# Patient Record
Sex: Male | Born: 1970 | Race: Black or African American | Hispanic: No | Smoking: Former smoker
Health system: Southern US, Community
[De-identification: ages and names within clinical notes are randomized; demographics above are authoritative.]

## PROBLEM LIST (undated history)

## (undated) DIAGNOSIS — M109 Gout, unspecified: Secondary | ICD-10-CM

## (undated) DIAGNOSIS — D649 Anemia, unspecified: Secondary | ICD-10-CM

## (undated) DIAGNOSIS — T7840XA Allergy, unspecified, initial encounter: Secondary | ICD-10-CM

## (undated) DIAGNOSIS — R011 Cardiac murmur, unspecified: Secondary | ICD-10-CM

## (undated) DIAGNOSIS — J301 Allergic rhinitis due to pollen: Secondary | ICD-10-CM

## (undated) DIAGNOSIS — F32A Depression, unspecified: Secondary | ICD-10-CM

## (undated) DIAGNOSIS — I10 Essential (primary) hypertension: Secondary | ICD-10-CM

## (undated) DIAGNOSIS — E7800 Pure hypercholesterolemia, unspecified: Secondary | ICD-10-CM

## (undated) DIAGNOSIS — H409 Unspecified glaucoma: Secondary | ICD-10-CM

## (undated) DIAGNOSIS — F419 Anxiety disorder, unspecified: Secondary | ICD-10-CM

## (undated) DIAGNOSIS — F489 Nonpsychotic mental disorder, unspecified: Secondary | ICD-10-CM

## (undated) DIAGNOSIS — H269 Unspecified cataract: Secondary | ICD-10-CM

## (undated) DIAGNOSIS — M1990 Unspecified osteoarthritis, unspecified site: Secondary | ICD-10-CM

## (undated) DIAGNOSIS — G709 Myoneural disorder, unspecified: Secondary | ICD-10-CM

## (undated) DIAGNOSIS — I509 Heart failure, unspecified: Secondary | ICD-10-CM

## (undated) DIAGNOSIS — IMO0002 Reserved for concepts with insufficient information to code with codable children: Secondary | ICD-10-CM

## (undated) DIAGNOSIS — E119 Type 2 diabetes mellitus without complications: Secondary | ICD-10-CM

## (undated) HISTORY — DX: Gout, unspecified: M10.9

## (undated) HISTORY — DX: Allergic rhinitis due to pollen: J30.1

## (undated) HISTORY — DX: Heart failure, unspecified: I50.9

## (undated) HISTORY — DX: Pure hypercholesterolemia, unspecified: E78.00

## (undated) HISTORY — DX: Type 2 diabetes mellitus without complications: E11.9

## (undated) HISTORY — DX: Nonpsychotic mental disorder, unspecified: F48.9

## (undated) HISTORY — PX: WISDOM TOOTH EXTRACTION: SHX21

## (undated) HISTORY — PX: HEMORROIDECTOMY: SUR656

## (undated) HISTORY — PX: EYE SURGERY: SHX253

---

## 1998-02-06 ENCOUNTER — Emergency Department (HOSPITAL_COMMUNITY): Admission: EM | Admit: 1998-02-06 | Discharge: 1998-02-06 | Payer: Self-pay

## 1998-02-12 ENCOUNTER — Emergency Department (HOSPITAL_COMMUNITY): Admission: EM | Admit: 1998-02-12 | Discharge: 1998-02-12 | Payer: Self-pay | Admitting: Emergency Medicine

## 1998-03-05 ENCOUNTER — Encounter: Payer: Self-pay | Admitting: Emergency Medicine

## 1998-03-05 ENCOUNTER — Emergency Department (HOSPITAL_COMMUNITY): Admission: EM | Admit: 1998-03-05 | Discharge: 1998-03-05 | Payer: Self-pay | Admitting: Emergency Medicine

## 2001-06-18 ENCOUNTER — Emergency Department (HOSPITAL_COMMUNITY): Admission: EM | Admit: 2001-06-18 | Discharge: 2001-06-18 | Payer: Self-pay | Admitting: Emergency Medicine

## 2001-07-04 ENCOUNTER — Encounter: Payer: Self-pay | Admitting: Specialist

## 2001-07-04 ENCOUNTER — Encounter: Admission: RE | Admit: 2001-07-04 | Discharge: 2001-07-04 | Payer: Self-pay | Admitting: Specialist

## 2019-12-26 ENCOUNTER — Encounter (HOSPITAL_COMMUNITY): Payer: Self-pay

## 2019-12-26 ENCOUNTER — Observation Stay (HOSPITAL_COMMUNITY)
Admission: EM | Admit: 2019-12-26 | Discharge: 2019-12-28 | Disposition: A | Discharge status: Home / Self Care | Payer: 59 | Attending: Family Medicine | Admitting: Family Medicine

## 2019-12-26 DIAGNOSIS — I509 Heart failure, unspecified: Secondary | ICD-10-CM

## 2019-12-26 DIAGNOSIS — R55 Syncope and collapse: Secondary | ICD-10-CM | POA: Insufficient documentation

## 2019-12-26 DIAGNOSIS — Z20822 Contact with and (suspected) exposure to covid-19: Secondary | ICD-10-CM | POA: Insufficient documentation

## 2019-12-26 DIAGNOSIS — E119 Type 2 diabetes mellitus without complications: Secondary | ICD-10-CM | POA: Insufficient documentation

## 2019-12-26 HISTORY — DX: Essential (primary) hypertension: I10

## 2019-12-26 HISTORY — DX: Unspecified osteoarthritis, unspecified site: M19.90

## 2019-12-26 HISTORY — DX: Syncope and collapse: R55

## 2019-12-26 LAB — URINALYSIS, ROUTINE W REFLEX MICROSCOPIC
Bacteria, UA: NONE SEEN
Bilirubin Urine: NEGATIVE
Glucose, UA: >=500 mg/dL — AB
Hgb urine dipstick: NEGATIVE
Ketones, ur: NEGATIVE mg/dL
Leukocytes,Ua: NEGATIVE
Nitrite: NEGATIVE
Protein, ur: NEGATIVE mg/dL
Specific Gravity, Urine: 1.014 (ref 1.005–1.030)
pH: 5 (ref 5.0–8.0)

## 2019-12-26 LAB — BASIC METABOLIC PANEL
Anion gap: 9 (ref 5–15)
BUN: 14 mg/dL (ref 6–20)
CO2: 25 mmol/L (ref 22–32)
Calcium: 10.1 mg/dL (ref 8.9–10.3)
Chloride: 106 mmol/L (ref 98–111)
Creatinine, Ser: 0.95 mg/dL (ref 0.61–1.24)
GFR calc Af Amer: >60 mL/min (ref >60)
GFR calc non Af Amer: >60 mL/min (ref >60)
Glucose, Bld: 117 mg/dL — ABNORMAL HIGH (ref 70–99)
Potassium: 4.1 mmol/L (ref 3.5–5.1)
Sodium: 140 mmol/L (ref 135–145)

## 2019-12-26 LAB — CBC
HCT: 47.9 % (ref 39.0–52.0)
Hemoglobin: 15.2 g/dL (ref 13.0–17.0)
MCH: 27 pg (ref 26.0–34.0)
MCHC: 31.7 g/dL (ref 30.0–36.0)
MCV: 84.9 fL (ref 80.0–100.0)
Platelets: 255 10*3/uL (ref 150–400)
RBC: 5.64 MIL/uL (ref 4.22–5.81)
RDW: 13 % (ref 11.5–15.5)
WBC: 7.7 10*3/uL (ref 4.0–10.5)
nRBC: 0 % (ref 0.0–0.2)

## 2019-12-26 NOTE — ED Triage Notes (Signed)
Pt from work with ems for bear syncopal episode while lifting heavy bags, pt became diaphoretic and hot. recent hx of diabetes. Pt has no complaints. Pt a.o VSS

## 2019-12-27 ENCOUNTER — Emergency Department (HOSPITAL_COMMUNITY): Payer: 59

## 2019-12-27 ENCOUNTER — Observation Stay (HOSPITAL_BASED_OUTPATIENT_CLINIC_OR_DEPARTMENT_OTHER): Payer: 59

## 2019-12-27 ENCOUNTER — Encounter (HOSPITAL_COMMUNITY): Payer: Self-pay | Admitting: Family Medicine

## 2019-12-27 ENCOUNTER — Other Ambulatory Visit: Payer: Self-pay

## 2019-12-27 DIAGNOSIS — I509 Heart failure, unspecified: Secondary | ICD-10-CM | POA: Diagnosis not present

## 2019-12-27 DIAGNOSIS — R55 Syncope and collapse: Secondary | ICD-10-CM

## 2019-12-27 HISTORY — DX: Syncope and collapse: R55

## 2019-12-27 LAB — CBC
HCT: 46.5 % (ref 39.0–52.0)
Hemoglobin: 15.1 g/dL (ref 13.0–17.0)
MCH: 27.2 pg (ref 26.0–34.0)
MCHC: 32.5 g/dL (ref 30.0–36.0)
MCV: 83.8 fL (ref 80.0–100.0)
Platelets: 237 10*3/uL (ref 150–400)
RBC: 5.55 MIL/uL (ref 4.22–5.81)
RDW: 13.2 % (ref 11.5–15.5)
WBC: 5.6 10*3/uL (ref 4.0–10.5)
nRBC: 0 % (ref 0.0–0.2)

## 2019-12-27 LAB — BASIC METABOLIC PANEL
Anion gap: 12 (ref 5–15)
BUN: 14 mg/dL (ref 6–20)
CO2: 23 mmol/L (ref 22–32)
Calcium: 9.8 mg/dL (ref 8.9–10.3)
Chloride: 103 mmol/L (ref 98–111)
Creatinine, Ser: 0.87 mg/dL (ref 0.61–1.24)
GFR calc Af Amer: >60 mL/min (ref >60)
GFR calc non Af Amer: >60 mL/min (ref >60)
Glucose, Bld: 152 mg/dL — ABNORMAL HIGH (ref 70–99)
Potassium: 3.7 mmol/L (ref 3.5–5.1)
Sodium: 138 mmol/L (ref 135–145)

## 2019-12-27 LAB — ECHOCARDIOGRAM COMPLETE
Area-P 1/2: 3.03 cm2
Height: 71 in
S' Lateral: 2.26 cm
Weight: 3760 oz

## 2019-12-27 LAB — LIPID PANEL
Cholesterol: 168 mg/dL (ref 0–200)
HDL: 32 mg/dL — ABNORMAL LOW (ref >40)
LDL Cholesterol: 99 mg/dL (ref 0–99)
Total CHOL/HDL Ratio: 5.3 RATIO
Triglycerides: 185 mg/dL — ABNORMAL HIGH (ref <150)
VLDL: 37 mg/dL (ref 0–40)

## 2019-12-27 LAB — HEMOGLOBIN A1C
Hgb A1c MFr Bld: 10.1 % — ABNORMAL HIGH (ref 4.8–5.6)
Mean Plasma Glucose: 243.17 mg/dL

## 2019-12-27 LAB — TROPONIN I (HIGH SENSITIVITY)
Troponin I (High Sensitivity): 3 ng/L (ref <18)
Troponin I (High Sensitivity): 3 ng/L (ref <18)

## 2019-12-27 LAB — GLUCOSE, CAPILLARY
Glucose-Capillary: 163 mg/dL — ABNORMAL HIGH (ref 70–99)
Glucose-Capillary: 124 mg/dL — ABNORMAL HIGH (ref 70–99)
Glucose-Capillary: 116 mg/dL — ABNORMAL HIGH (ref 70–99)

## 2019-12-27 LAB — MAGNESIUM: Magnesium: 1.8 mg/dL (ref 1.7–2.4)

## 2019-12-27 LAB — TSH: TSH: 1.969 u[IU]/mL (ref 0.350–4.500)

## 2019-12-27 LAB — HIV ANTIBODY (ROUTINE TESTING W REFLEX): HIV Screen 4th Generation wRfx: NONREACTIVE

## 2019-12-27 LAB — BRAIN NATRIURETIC PEPTIDE: B Natriuretic Peptide: 173.8 pg/mL — ABNORMAL HIGH (ref 0.0–100.0)

## 2019-12-27 LAB — SARS CORONAVIRUS 2 BY RT PCR (HOSPITAL ORDER, PERFORMED IN ~~LOC~~ HOSPITAL LAB): SARS Coronavirus 2: NEGATIVE

## 2019-12-27 LAB — CBG MONITORING, ED: Glucose-Capillary: 122 mg/dL — ABNORMAL HIGH (ref 70–99)

## 2019-12-27 MED ORDER — SODIUM CHLORIDE 0.9% FLUSH
3.0000 mL | Freq: Two times a day (BID) | INTRAVENOUS | Status: DC
  Administered 2019-12-27: 3 mL via INTRAVENOUS

## 2019-12-27 MED ORDER — POLYETHYLENE GLYCOL 3350 17 G PO PACK: 17.0000 g | PACK | Freq: Every day | ORAL | Status: DC | PRN

## 2019-12-27 MED ORDER — ACETAMINOPHEN 325 MG PO TABS: 650.0000 mg | ORAL_TABLET | Freq: Four times a day (QID) | ORAL | Status: DC | PRN

## 2019-12-27 MED ORDER — INSULIN ASPART 100 UNIT/ML ~~LOC~~ SOLN
0.0000 [IU] | Freq: Three times a day (TID) | SUBCUTANEOUS | Status: DC
  Administered 2019-12-27: 2 [IU] via SUBCUTANEOUS

## 2019-12-27 MED ORDER — ACETAMINOPHEN 650 MG RE SUPP: 650.0000 mg | Freq: Four times a day (QID) | RECTAL | Status: DC | PRN

## 2019-12-27 MED ORDER — ENOXAPARIN SODIUM 40 MG/0.4ML ~~LOC~~ SOLN
40.0000 mg | Freq: Every day | SUBCUTANEOUS | Status: DC
  Administered 2019-12-27: 40 mg via SUBCUTANEOUS
  Filled 2019-12-27: qty 0.4

## 2019-12-27 MED ORDER — ROSUVASTATIN CALCIUM 5 MG PO TABS
10.0000 mg | ORAL_TABLET | Freq: Every day | ORAL | Status: DC
  Administered 2019-12-27 – 2019-12-28 (×2): 10 mg via ORAL
  Filled 2019-12-27 (×3): qty 2

## 2019-12-27 MED ORDER — FUROSEMIDE 10 MG/ML IJ SOLN
40.0000 mg | Freq: Once | INTRAMUSCULAR | Status: AC
  Administered 2019-12-27: 40 mg via INTRAVENOUS
  Filled 2019-12-27: qty 4

## 2019-12-27 NOTE — Progress Notes (Signed)
  Echocardiogram 2D Echocardiogram has been performed.  Victor [name] 12/27/2019, 1:54 PM

## 2019-12-27 NOTE — H&P (Addendum)
Family Medicine Teaching [facility] Admission History and Physical Service Pager: [phone]  Patient name: Victor [name] Medical record number: [id number] Date of birth: 1970/08/28 Age: 49 y.o. Gender: male  Primary Care Provider: System, Pcp Not In Consultants: None Code Status: Full Preferred Emergency Contact: Audry Pili, [phone]  Chief Complaint: dizziness and palpitations     Assessment and Plan: Victor [name] is a 49 y.o. male presenting after near syncopal episode at work. PMH is significant for congestive heart disease, diabetes.    Near Syncope Patient presented after having near syncopal episode with associated palpitations, dizziness, diaphoresis and chest discomfort after lifting heavy equipment at work. States that this has occurred 5-6 times in the last couple months.  A few of which, he had LOC as he was not able to sit down soon enough.  Events have occurred with exertion, during a shower and when he was hot.  Possibly vasovagal as patient reports several of these events have occurred when he was hot.  Doubt infectious causes patient is afebrile without leukocytosis. Possibly TIA however patient denies confusion, extremity weakness or headache during this event. There is growing concern for an abnormal rhythm as patient complained of palpitations during this event with known hx of CHF. Denied nausea or vomiting.  Patient reports pitching/charley horse type pain in his chest that last for several minutes during some of these events. Has persistent LE edema. Patient reports hx of PFO.  Etiology likely cardiac in origin. Chest x-ray, BMP and CBC in ED were unremarkable. UA with glucosuria. Vital signs stable.  Will obtain ECHO to further evaluate. Patient may need cardiology evaluation.  - Admit to cardiac telemetry, attending Dr. Manson Passey - AM EKG - Obtain ECHO - Lasix 40 mg IV, titrate depending on fluid status and creatinine - Vitals per unit routine - Strict I's and  O's  - Daily weights - Continuous pulse ox - Follow up a1c, BNP, lipid panel, TSH, Mg  - Repeat BMP, CBC today  - AM CMP, BMP - Orthostatic vitals  Type 2 Diabetes mellitus Patient recently diagnosed with DM 5-6 weeks ago after having blood sugar at work that read  "High".  Patient hospitalized for about a day in [facility]. Started on Metformin and Trulicility.   Reports taking medications as prescribed.  Blood sugars at home have been between 130-150.  Glucose on admission 117 with glucosuria.  Home regimen includes 4.5 mg Trulicity weekly and 1000 mg daily of Metformin.  States that his last A1c was "off the charts".  - sensitive sliding scale insulin - Start Crestor 10 mg  - Hold home medication - a1c -Lipid panel  Congestive Heart Failure  No rales or JVP however patient has 2+/3+ pitting edema.  Reports his leg swelling is better than his baseline today.  His shoe size increased from 11 to 14 due to swelling. Patient reporting worsening shortness of breath,  4 pilllow orthopnea and  occassional chest discomfort described as "pinch of the heart or a charlie horse". Discomfort lasts for 5-10 minutes. Feels short of breath when walking.  Can't take a flight of steps before having to stop on the last few steps.  In the ED patient's chest x-ray unremarkable.  Patient prescribed 60 mg daily of Lasix but however has been taking only 20 mg daily because higher doses give him cramps.  Patient does not use oxygen at home.  He is satting well on room air.  No recent echo available for review.  Patient  is unaware of his EF and has not followed with a cardiologist. - 40 mg IV Lasix  - f/u BNP, a1c, lipid panel  - Strict Is/Os  - daily weights    FEN/GI: Replete electrolytes as needed, MiraLAX as needed Prophylaxis: Lovenox   Disposition: Admit to cardiac telemetry    History of Present Illness:  Victor [name] is a 49 y.o. male presenting after near syncopal episode at  work.  Patient states that he was at work early this morning (he works third shift) and began profusely sweating and having dizziness.  Reports some chest discomfort related to palpitations.  He had just finished lifting heavy materials when symptoms started.  Endorses pain at the base of his neck during this event. He sat down in front of a fan to cool off as he did not want to pass out. Reports he called the nurse who took about 10 to 15 minutes to see him who checked his blood sugar and was told it was fine.  EMS evaluated patient and recommended patient be seen at the hospital.  Denies current symptoms.  States he feels fine now but would like to figure out what is going on with him as sittings events have occurred before.   In the ED, vital signs were stable and labs and chest x-ray were unremarkable.    Review Of Systems: Per HPI with the following additions:   Review of Systems  Constitutional: Positive for diaphoresis. Negative for fever.  HENT: Negative for sore throat.   Eyes: Positive for blurred vision.  Respiratory: Positive for shortness of breath and wheezing. Negative for cough.   Cardiovascular: Positive for chest pain (discomfort), palpitations and orthopnea.  Gastrointestinal: Negative for abdominal pain, constipation, nausea and vomiting.  Genitourinary: Negative for dysuria.  Musculoskeletal: Positive for neck pain. Negative for back pain.  Skin: Negative for rash.  Neurological: Positive for dizziness. Negative for loss of consciousness and headaches.  All other systems reviewed and are negative.   Patient Active Problem List   Diagnosis Date Noted  . Near syncope 12/27/2019  . Pre-syncope 12/27/2019    Past Medical History: Past Medical History:  Diagnosis Date  . Arthritis   . CHF (congestive heart failure) (HCC)   . Diabetes mellitus without complication (HCC)   . Hypertension   . Syncope 12/26/2019    Past Surgical History: Past Surgical History:   Procedure Laterality Date  . EYE SURGERY    . WISDOM TOOTH EXTRACTION      Social History: Social History   Tobacco Use  . Smoking status: Former Games developer  . Smokeless tobacco: Never Used  Vaping Use  . Vaping Use: Never used  Substance Use Topics  . Alcohol use: Never  . Drug use: Never   Additional social history: Smoked 1/3 ppd for 15 years and quit about 20 years ago.  Denies history of IV drug use.  States he has not drink alcohol in about 20 years. Please also refer to relevant sections of EMR.  Family History: History reviewed. No pertinent family history.  Multiple family members have DM.     Allergies and Medications: Allergies  Allergen Reactions  . Shellfish-Derived Products Hives  . Coconut Oil Hives and Rash   No current facility-administered medications on file prior to encounter.   Current Outpatient Medications on File Prior to Encounter  Medication Sig Dispense Refill  . furosemide (LASIX) 20 MG tablet Take 20 mg by mouth daily.    . metFORMIN (GLUCOPHAGE) 1000  MG tablet Take 1,000 mg by mouth daily.     . TRULICITY 4.5 MG/0.5ML SOPN Inject 4.5 mg into the skin once a week.       Objective: BP (!) 130/107 (BP Location: Right Arm)   Pulse 63   Temp 98.3 F (36.8 C) (Oral)   Resp 18   SpO2 98%  Exam: GEN:     alert, well appearing male and no distress    HENT:  mucus membranes moist, oropharyngeal without lesions or erythema,  nares patent, no nasal discharge  EYES:   pupils equal and reactive, left eye hypertropia NECK:  supple, normal ROM RESP:  clear to auscultation bilaterally, no increased work of breathing  CVS:   regular rate and rhythm, no murmur, distal pulses intact, no JVP ABD:  soft, non-tender; bowel sounds present; no palpable masses EXT:   normal ROM, atraumatic, 2+/3+ bilateral pitting edema NEURO: speech normal, alert and oriented, strength 5/5 UE and LE, gross sensation intact Skin:   warm and dry, no rash, normal skin  turgor Psych: Normal affect, appropriate speech and behavior    Labs and Imaging: CBC BMET  Recent Labs  Lab 12/26/19 1940  WBC 7.7  HGB 15.2  HCT 47.9  PLT 255   Recent Labs  Lab 12/26/19 1940  NA 140  K 4.1  CL 106  CO2 25  BUN 14  CREATININE 0.95  GLUCOSE 117*  CALCIUM 10.1     EKG: Heart rate 66, NSR, normal axis;  personally reviewed by V.Mustapha Colson  DG Chest Portable 1 View  Result Date: 12/27/2019 CLINICAL DATA:  Near syncope, congestive heart failure. Syncopal episode with lifting heavy bags. Patient became diaphoretic and hot. Recent history of diabetes. EXAM: PORTABLE CHEST 1 VIEW COMPARISON:  Chest x-ray 11/16/2019 FINDINGS: Multiple lines overlie the patient. the heart size and mediastinal contours are unchanged. Both lungs are clear. No pleural effusion or pneumothorax. The visualized skeletal structures are unremarkable. IMPRESSION: No active cardiopulmonary disease. Electronically Signed   By: Tish Frederickson M.D.   On: 12/27/2019 08:58     Katha Cabal, DO 12/27/2019, 11:16 AM PGY-2, [facility] FPTS Intern pager: [phone], text pages welcome

## 2019-12-27 NOTE — ED Provider Notes (Signed)
[facility] EMERGENCY DEPARTMENT Provider Note   CSN: [id number] Arrival date & time: 12/26/19  1905     History Chief Complaint  Patient presents with  . Near Syncope    Victor [name] is a 49 y.o. male.  HPI 49 year old male presents with near syncope yesterday.  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  Did not go away and so he had to sit down in front of a fan.  Overall it lasts about 20 minutes.  He denies any type of headache or chest pain but does feel like he was having a harder time breathing and his heart was feeling like it was beating out of his chest.  He does not think it was irregular.  He has CHF and states it is "bad" but he does not know an EF.  He has chronic leg swelling.  History reviewed. No pertinent past medical history.  Patient Active Problem List   Diagnosis Date Noted  . Near syncope 12/27/2019    History reviewed. No pertinent surgical history.     No family history on file.  Social History   Tobacco Use  . Smoking status: Not on file  Substance Use Topics  . Alcohol use: Not on file  . Drug use: Not on file    Home Medications Prior to Admission medications   Medication Sig Start Date End Date Taking? Authorizing Provider  furosemide (LASIX) 20 MG tablet Take 20 mg by mouth daily. 10/04/19  Yes [provider]  metFORMIN (GLUCOPHAGE) 1000 MG tablet Take 1,000 mg by mouth daily.  10/04/19  Yes [provider]  TRULICITY 4.5 MG/0.5ML SOPN Inject 4.5 mg into the skin once a week.  11/17/19  Yes [provider]    Allergies    Shellfish-derived products and Coconut oil  Review of Systems   Review of Systems  Respiratory: Positive for shortness of breath.   Cardiovascular: Positive for palpitations and leg swelling. Negative for chest pain.  Neurological: Positive for light-headedness. Negative for dizziness, syncope and  headaches.  All other systems reviewed and are negative.   Physical Exam Updated Vital Signs BP 114/70   Pulse 75   Temp 97.7 F (36.5 C) (Oral)   Resp 18   SpO2 99%   Physical Exam Vitals and nursing note reviewed.  Constitutional:      General: He is not in acute distress.    Appearance: He is well-developed. He is not ill-appearing or diaphoretic.  HENT:     Head: Normocephalic and atraumatic.     Right Ear: External ear normal.     Left Ear: External ear normal.     Nose: Nose normal.  Eyes:     General:        Right eye: No discharge.        Left eye: No discharge.     Extraocular Movements: Extraocular movements intact.     Pupils: Pupils are equal, round, and reactive to light.  Cardiovascular:     Rate and Rhythm: Normal rate and regular rhythm.     Heart sounds: Normal heart sounds. No murmur heard.   Pulmonary:     Effort: Pulmonary effort is normal.     Breath sounds: Normal breath sounds.  Abdominal:     Palpations: Abdomen is soft.     Tenderness: There is no abdominal tenderness.  Musculoskeletal:     Cervical  back: Neck supple.     Right lower leg: Edema present.     Left lower leg: Edema present.  Skin:    General: Skin is warm and dry.  Neurological:     Mental Status: He is alert.     Comments: No slurred speech. 5/5 strength in all 4 extremities. Grossly normal sensation. Normal finger to nose.   Psychiatric:        Mood and Affect: Mood is not anxious.     ED Results / Procedures / Treatments   Labs (all labs ordered are listed, but only abnormal results are displayed) Labs Reviewed  BASIC METABOLIC PANEL - Abnormal; Notable for the following components:      Result Value   Glucose, Bld 117 (*)    All other components within normal limits  URINALYSIS, ROUTINE W REFLEX MICROSCOPIC - Abnormal; Notable for the following components:   APPearance HAZY (*)    Glucose, UA >=500 (*)    All other components within normal limits  CBG  MONITORING, ED - Abnormal; Notable for the following components:   Glucose-Capillary 122 (*)    All other components within normal limits  SARS CORONAVIRUS 2 BY RT PCR [facility] ORDER, PERFORMED IN [facility])  CBC    EKG EKG Interpretation  Date/Time:  Tuesday December 26 2019 19:16:00 EDT Ventricular Rate:  66 PR Interval:  148 QRS Duration: 92 QT Interval:  366 QTC Calculation: 383 R Axis:   26 Text Interpretation: Normal sinus rhythm no acute ST/T changes No old tracing to compare Confirmed by Pricilla Loveless [phone]) on 12/27/2019 7:59:28 AM   Radiology DG Chest Portable 1 View  Result Date: 12/27/2019 CLINICAL DATA:  Near syncope, congestive heart failure. Syncopal episode with lifting heavy bags. Patient became diaphoretic and hot. Recent history of diabetes. EXAM: PORTABLE CHEST 1 VIEW COMPARISON:  Chest x-ray 11/16/2019 FINDINGS: Multiple lines overlie the patient. the heart size and mediastinal contours are unchanged. Both lungs are clear. No pleural effusion or pneumothorax. The visualized skeletal structures are unremarkable. IMPRESSION: No active cardiopulmonary disease. Electronically Signed   By: Tish Frederickson M.D.   On: 12/27/2019 08:58    Procedures Procedures (including critical care time)  Medications Ordered in ED Medications - No data to display  ED Course  I have reviewed the triage vital signs and the nursing notes.  Pertinent labs & imaging results that were available during my care of the patient were reviewed by me and considered in my medical decision making (see chart for details).    MDM Rules/Calculators/A&P                          Currently patient feels fine and has some chronic lower extremity edema but nothing else significant on exam.  However, with the palpitations/tachycardia that he was feeling in association with near syncope that was not clearly orthostatic, I am concerned that this could be CHF/arrhythmia related.  I  think you will need telemetry monitoring and echo.  Discussed with family practice for admission.  I have personally reviewed the labs, x-ray, ECG. Final Clinical Impression(s) / ED Diagnoses Final diagnoses:  Near syncope    Rx / DC Orders ED Discharge Orders    None       Pricilla Loveless, MD 12/27/19 [phone]

## 2019-12-27 NOTE — Progress Notes (Signed)
PT Cancellation Note  Patient Details Name: Victor [name] MRN: [id number] DOB: 22-Feb-1971   Cancelled Treatment:    Reason Eval/Treat Not Completed: PT screened, no needs identified, will sign off Per OT, no further acute needs. Will sign off. If needs change, please re-consult.   Cindee Salt, DPT  Acute Rehabilitation Services  Pager: [phone] Office: [phone]    Lehman Prom 12/27/2019, 5:12 PM

## 2019-12-27 NOTE — Progress Notes (Signed)
FPTS Interim Progress Note  S: Interviewed patient at bedside. He reports doing well. Currently resting comfortably. States his legs are close to baseline swollen. Has bed positioned so that his legs are in an elevated position. States his left leg is always more swollen than his right. He reports no chest pain, no leg pain, and no shortness of breath with lying down.   O: BP 111/73 (BP Location: Right Arm)   Pulse 63   Temp 97.6 F (36.4 C) (Oral)   Resp 18   Ht 5\' 11"  (1.803 m)   Wt 106.6 kg   SpO2 98%   BMI 32.78 kg/m   Physical Exam Vitals and nursing note reviewed.  Constitutional:      General: He is not in acute distress.    Appearance: Normal appearance. He is not ill-appearing or toxic-appearing.  Cardiovascular:     Pulses:          Dorsalis pedis pulses are 2+ on the right side and 2+ on the left side.  Musculoskeletal:     Right lower leg: 1+ Edema present.     Left lower leg: 2+ Edema present.  Neurological:     Mental Status: He is alert. Mental status is at baseline.      A/P: Patient is currently stable and resting comfortably. No changes need to be made at this time. Will continue to monitor.  , MD 12/27/2019, 10:34 PM PGY-1, [facility] Family Medicine Service pager [phone]

## 2019-12-27 NOTE — Evaluation (Signed)
Occupational Therapy Evaluation Patient Details Name: Victor [name] MRN: [id number] DOB: 26-Apr-1970 Today's Date: 12/27/2019    History of Present Illness 49 y.o male presenting with near syncope, dizziness, and palpitations at work. States that this has occurred 5-6 times in the last couple months.  A few of which, he had LOC as he was not able to sit down soon enough. PMH includes congestive heart disease, diabetes   Clinical Impression   PTA pt living alone and functioning at independent community level. At time of eval, pt presents with ability to complete all mobility and ADL at baseline level. Pt completed functional mobility in the hallway independently beyond a household distance. He does endorse increased edema in BLEs and fatigue with exertion. Reviewed HEP for BLE edema pt can complete in daily routine. Also reviewed energy conservation strategies pt can apply to both ADL and IADL routines. Will see if pt is appropriate for cardiac rehab, believe he would benefit from that level of intensive training. No further OT needs identified, OT Will sign off. Thank you for this consult.    Follow Up Recommendations  No OT follow up    Equipment Recommendations  None recommended by OT    Recommendations for Other Services       Precautions / Restrictions Precautions Precautions: None Restrictions Weight Bearing Restrictions: No      Mobility Bed Mobility Overal bed mobility: Independent                Transfers Overall transfer level: Independent                    Balance Overall balance assessment: No apparent balance deficits (not formally assessed)                                         ADL either performed or assessed with clinical judgement   ADL Overall ADL's : Modified independent                                       General ADL Comments: Pt demonstrates ability to complete all BADL at mod I level. Reviewed ECS  strategies as it applies to pts daily ADL/IADL routine.     Vision         Perception     Praxis      Pertinent Vitals/Pain Pain Assessment: No/denies pain     Hand Dominance     Extremity/Trunk Assessment Upper Extremity Assessment Upper Extremity Assessment: Overall WFL for tasks assessed   Lower Extremity Assessment Lower Extremity Assessment: Overall WFL for tasks assessed (bil edematous extremities)       Communication Communication Communication: No difficulties   Cognition Arousal/Alertness: Awake/alert Behavior During Therapy: WFL for tasks assessed/performed Overall Cognitive Status: Within Functional Limits for tasks assessed                                     General Comments       Exercises     Shoulder Instructions      Home Living Family/patient expects to be discharged to:: Private residence Living Arrangements: Alone Available Help at Discharge: Friend(s);Available PRN/intermittently Type of Home: House Home Access: Level entry     Home  Layout: One level     Bathroom Shower/Tub: Chief Strategy Officer: Standard     Home Equipment: None          Prior Functioning/Environment Level of Independence: Independent        Comments: works in Advice worker Problem List: Decreased knowledge of precautions;Cardiopulmonary status limiting activity      OT Treatment/Interventions:      OT Goals(Current goals can be found in the care plan section) Acute Rehab OT Goals Patient Stated Goal: return to work when able OT Goal Formulation: All assessment and education complete, DC therapy  OT Frequency:     Barriers to D/C:            Co-evaluation              AM-PAC OT "6 Clicks" Daily Activity     Outcome Measure Help from another person eating meals?: None Help from another person taking care of personal grooming?: None Help from another person toileting, which includes using  toliet, bedpan, or urinal?: None Help from another person bathing (including washing, rinsing, drying)?: None Help from another person to put on and taking off regular upper body clothing?: None Help from another person to put on and taking off regular lower body clothing?: None 6 Click Score: 24   End of Session Nurse Communication: Mobility status  Activity Tolerance: Patient tolerated treatment well Patient left: in bed  OT Visit Diagnosis: Other abnormalities of gait and mobility (R26.89)                Time: 8466-5993 OT Time Calculation (min): 13 min Charges:  OT General Charges $OT Visit: 1 Visit OT Evaluation $OT Eval Low Complexity: 1 Low  Dalphine Handing, MSOT, OTR/L Acute Rehabilitation Services [facility] Office Number: [phone] Pager: [phone]  Dalphine Handing 12/27/2019, 6:29 PM

## 2019-12-28 ENCOUNTER — Other Ambulatory Visit: Payer: Self-pay

## 2019-12-28 DIAGNOSIS — Z20822 Contact with and (suspected) exposure to covid-19: Secondary | ICD-10-CM | POA: Diagnosis not present

## 2019-12-28 DIAGNOSIS — E119 Type 2 diabetes mellitus without complications: Secondary | ICD-10-CM

## 2019-12-28 DIAGNOSIS — R55 Syncope and collapse: Secondary | ICD-10-CM | POA: Diagnosis not present

## 2019-12-28 LAB — COMPREHENSIVE METABOLIC PANEL
ALT: 30 U/L (ref 0–44)
AST: 22 U/L (ref 15–41)
Albumin: 3.4 g/dL — ABNORMAL LOW (ref 3.5–5.0)
Alkaline Phosphatase: 74 U/L (ref 38–126)
Anion gap: 8 (ref 5–15)
BUN: 13 mg/dL (ref 6–20)
CO2: 25 mmol/L (ref 22–32)
Calcium: 9.2 mg/dL (ref 8.9–10.3)
Chloride: 102 mmol/L (ref 98–111)
Creatinine, Ser: 0.94 mg/dL (ref 0.61–1.24)
GFR calc Af Amer: >60 mL/min (ref >60)
GFR calc non Af Amer: >60 mL/min (ref >60)
Glucose, Bld: 179 mg/dL — ABNORMAL HIGH (ref 70–99)
Potassium: 3.9 mmol/L (ref 3.5–5.1)
Sodium: 135 mmol/L (ref 135–145)
Total Bilirubin: 0.7 mg/dL (ref 0.3–1.2)
Total Protein: 6.3 g/dL — ABNORMAL LOW (ref 6.5–8.1)

## 2019-12-28 LAB — CBC
HCT: 45.3 % (ref 39.0–52.0)
Hemoglobin: 14.8 g/dL (ref 13.0–17.0)
MCH: 27 pg (ref 26.0–34.0)
MCHC: 32.7 g/dL (ref 30.0–36.0)
MCV: 82.7 fL (ref 80.0–100.0)
Platelets: 230 10*3/uL (ref 150–400)
RBC: 5.48 MIL/uL (ref 4.22–5.81)
RDW: 12.7 % (ref 11.5–15.5)
WBC: 4.3 10*3/uL (ref 4.0–10.5)
nRBC: 0 % (ref 0.0–0.2)

## 2019-12-28 LAB — GLUCOSE, CAPILLARY
Glucose-Capillary: 117 mg/dL — ABNORMAL HIGH (ref 70–99)
Glucose-Capillary: 90 mg/dL (ref 70–99)

## 2019-12-28 MED ORDER — ACETAMINOPHEN 325 MG PO TABS: 650.0000 mg | ORAL_TABLET | Freq: Four times a day (QID) | ORAL | Status: DC | PRN

## 2019-12-28 MED ORDER — ROSUVASTATIN CALCIUM 10 MG PO TABS
10.0000 mg | ORAL_TABLET | Freq: Every day | ORAL | 0 refills | Status: DC
Start: 2019-12-29 — End: 2019-12-28

## 2019-12-28 MED ORDER — ROSUVASTATIN CALCIUM 10 MG PO TABS: 10.0000 mg | ORAL_TABLET | Freq: Every day | ORAL | 0 refills | Status: DC

## 2019-12-28 NOTE — Progress Notes (Signed)
Family Medicine Teaching Service Daily Progress Note Intern Pager: [phone]  Patient name: Victor [name] Medical record number: [id number] Date of birth: 1970/10/28 Age: 49 y.o. Gender: male  Primary Care Provider: System, Pcp Not In Consultants: None Code Status: FULL  Pt Overview and Major Events to Date:  9/8: admitted  Assessment and Plan:  Victor [name] is a 49 y.o. male presenting after near syncopal episode at work. PMH is significant for T2DM and PFO, also ?CHF.  Pre-Syncopal and Syncopal Episodes, known PFO Stable- no further episodes since admission. Etiology remains unclear although history most consistent with vasovagal episodes in the setting of dehydration, heat, and exertion. EKG and overnight telemetry unremarkable, so less likely cardiac arrhythmia. Trop 3>3 so do not suspect MI. Echo did not show evidence of CHF but showed known PFO. PE and TIA less likely. -Outpatient cardiology consult for stress test, loop monitor  Type 2 DM Recently diagnosed ~5 weeks ago, at which time he was started on Metformin 1000mg  daily and Trulicity 4.5mg  weekly. A1C obtained yesterday was 10.1%. Glucose has ranged from 116-163 during admission. He received 2u SAI yesterday. -sSSI -Holding home meds -CBG qAC and qhs -Crestor 10mg  daily  Lower Extremity Edema Patient with several years of lower extremity edema. Prescribed 60mg  Lasix as outpatient but only taking 20mg  due to b/l leg cramping. BNP 173.8 on admission. Echo did not show evidence of heart failure. -s/p 40mg  IV Lasix -discontinue home lasix -continue compression stockings -outpatient workup for venous insufficiency and other causes  FEN/GI: Carb modified diet PPx: Lovenox  Status is: Observation The patient remains OBS appropriate and will d/c before 2 midnights.  Dispo:  Patient From: Home  Planned Disposition: Home  Expected discharge date: 12/28/19  Medically stable for discharge: Yes    Subjective:  Patient  reports feeling well since admission. No further episodes of lightheaded/dizziness, presyncope or syncope. He denies chest pain or SOB. States his leg swelling is much improved from prior.  Objective: Temp:  [97.6 F (36.4 C)-98.3 F (36.8 C)] 97.7 F (36.5 C) (09/09 0535) Pulse Rate:  [63-76] 70 (09/09 0535) Resp:  [17-19] 19 (09/09 0535) BP: (105-130)/(71-107) 113/78 (09/09 0535) SpO2:  [98 %-99 %] 99 % (09/09 0535) Weight:  [106.3 kg-106.6 kg] 106.3 kg (09/09 0535) Physical Exam: General: alert, well-appearing, NAD Cardiovascular: RRR, normal S1/S2 without m/r/g Respiratory: normal WOB, lungs CTAB Abdomen: +BS, soft, non-tender, nondistended Extremities: 1+ nonpitting edema of bilateral ankles  Laboratory: Recent Labs  Lab 12/26/19 1940 12/27/19 1228 12/28/19 0817  WBC 7.7 5.6 4.3  HGB 15.2 15.1 14.8  HCT 47.9 46.5 45.3  PLT 255 237 230   Recent Labs  Lab 12/26/19 1940 12/27/19 1228 12/28/19 0817  NA 140 138 135  K 4.1 3.7 3.9  CL 106 103 102  CO2 25 23 25   BUN 14 14 13   CREATININE 0.95 0.87 0.94  CALCIUM 10.1 9.8 9.2  PROT  --   --  6.3*  BILITOT  --   --  0.7  ALKPHOS  --   --  74  ALT  --   --  30  AST  --   --  22  GLUCOSE 117* 152* 179*    Imaging/Diagnostic Tests: DG Chest Portable 1 View Result Date: 12/27/2019 IMPRESSION: No active cardiopulmonary disease.   ECHOCARDIOGRAM COMPLETE Result Date: 12/27/2019 IMPRESSIONS   1. Left ventricular ejection fraction, by estimation, is 60 to 65%. The left ventricle has normal function. The left ventricle has no regional wall  motion abnormalities. Left ventricular diastolic parameters were normal.  2. Right ventricular systolic function is normal. The right ventricular size is normal. There is normal pulmonary artery systolic pressure.   3. Cannot r/o small PFO by color.   4. The mitral valve is normal in structure. Trivial mitral valve regurgitation. No evidence of mitral stenosis.   5. The aortic valve is  normal in structure. Aortic valve regurgitation is not visualized. Mild to moderate aortic valve sclerosis/calcification is present, without any evidence of aortic stenosis.   6. The inferior vena cava is normal in size with greater than 50% respiratory variability, suggesting right atrial pressure of 3 mmHg.    Maury Dus, MD 12/28/2019, 9:46 AM PGY-1, [facility] Family Medicine FPTS Intern pager: [phone], text pages welcome

## 2019-12-28 NOTE — Discharge Summary (Addendum)
Family Medicine Teaching [facility] Discharge Summary  Patient name: Victor [name] Medical record number: [id number] Date of birth: [birth year] Age: 49 y.o. Gender: male Date of Admission: 12/27/2019  Date of Discharge: 12/28/2019 Admitting Physician: Katha Cabal, DO  Primary Care Provider: System, Pcp Not In Consultants: None  Indication for Hospitalization: Near-Syncope  Discharge Diagnoses/Problem List:  Syncope Near-Syncope Patent foramen ovale T2DM Lower extremity edema  Disposition: Home  Discharge Condition: Stable  Discharge Exam:  General: alert, well-appearing, NAD Neck: full ROM, no cervical or supraclavicular lymphadenopathy Cardiovascular: RRR, normal S1/S2 without m/r/g Respiratory: normal WOB, lungs CTAB Abdomen: +BS, soft, non-tender, nondistended Extremities: 1+ nonpitting edema of bilateral ankles Skin: no rashes or lesions Neuro: grossly intact, oriented to person, place, time  Brief Hospital Course:   Near-Syncope/Syncope Patient presented after near-syncopal episode at work. Patient was lifting heavy equipment in a hot room when he became dizzy, diaphoretic, had palpitations and felt he might pass out. He has had several (4-5) of these episodes over the past few months, and additional episodes over the course of many years. Occasionally, when he doesn't sit down soon enough, he will have LOC/true syncope. Thought to be vasovagal based on hx, but patient was admitted overnight for observation and evaluation of other etiologies.  Workup for cardiac causes (arrhythmia, MI, structural abnormality, CHF) was negative. CBC, CMP, TSH, lipid panel, EKG and CXR were unremarkable, troponin 2>2, overnight telemetry normal, BNP 173.8, and echo did not show any evidence of CHF or valvular abnormality. EF was 60 to 65%, and PFO was identified.  Patient had been taking Lasix 20mg  daily and only drinking 2 glasses of water due to receiving prior diagnosis of CHF. Also  with glucosuria on UA. Suspect dehydration may have been contributing factor to vasovagal, near-syncopal episodes. Instructed to d/c Lasix and increase water intake on discharge.  T2DM Patient recently diagnosed with Type 2 Diabetes and started on Metformin 1000mg  daily and Trulicity 4.5mg  weekly. A1C on admission 10.1. His home meds were held and he was placed on sliding scale insulin while hospitalized with sugars 116-163 during admission. Instructed to continue home meds and Crestor 10mg  daily on discharge.  Lower Extremity Edema Patient reported b/l lower extremity edema for several years. Echo did not reveal CHF, so Lasix was discontinued and patient was instructed to follow up with PCP for further evaluation and management.   Issues for Follow Up:  1. Cardiology referral placed, consider stress test and loop monitor 2. Evaluation for b/l leg edema (echo was negative for CHF, Lasix discontinued) 3. Will need repeat A1C in 2-3 months.  Significant Procedures: None  Significant Labs and Imaging:  Recent Labs  Lab 12/26/19 1940 12/27/19 1228 12/28/19 0817  WBC 7.7 5.6 4.3  HGB 15.2 15.1 14.8  HCT 47.9 46.5 45.3  PLT 255 237 230   Recent Labs  Lab 12/26/19 1940 12/26/19 1940 12/27/19 1228 12/28/19 0817  NA 140  --  138 135  K 4.1   < > 3.7 3.9  CL 106  --  103 102  CO2 25  --  23 25  GLUCOSE 117*  --  152* 179*  BUN 14  --  14 13  CREATININE 0.95  --  0.87 0.94  CALCIUM 10.1  --  9.8 9.2  MG  --   --  1.8  --   ALKPHOS  --   --   --  74  AST  --   --   --  22  ALT  --   --   --  30  ALBUMIN  --   --   --  3.4*   < > = values in this interval not displayed.   Lipids: Total 168, HDL 32, LDL 99, TG 185 A1C: 10.1% Trop: 3, 3 BNP 173.8 TSH 1.969 HIV nonreactive  DG Chest Portable 1 View Result Date: 12/27/2019 IMPRESSION: No active cardiopulmonary disease.   ECHOCARDIOGRAM COMPLETE Result Date: 12/27/2019 IMPRESSIONS   1. Left ventricular ejection fraction, by  estimation, is 60 to 65%. The left ventricle has normal function. The left ventricle has no regional wall motion abnormalities. Left ventricular diastolic parameters were normal.  2. Right ventricular systolic function is normal. The right ventricular size is normal. There is normal pulmonary artery systolic pressure.   3. Cannot r/o small PFO by color.   4. The mitral valve is normal in structure. Trivial mitral valve regurgitation. No evidence of mitral stenosis.   5. The aortic valve is normal in structure. Aortic valve regurgitation is not visualized. Mild to moderate aortic valve sclerosis/calcification is present, without any evidence of aortic stenosis.   6. The inferior vena cava is normal in size with greater than 50% respiratory variability, suggesting right atrial pressure of 3 mmHg.   Results/Tests Pending at Time of Discharge: None  Discharge Medications:  Allergies as of 12/28/2019      Reactions   Shellfish-derived Products Hives   Coconut Oil Hives, Rash      Medication List    STOP taking these medications   furosemide 20 MG tablet Commonly known as: LASIX     TAKE these medications   acetaminophen 325 MG tablet Commonly known as: TYLENOL Take 2 tablets (650 mg total) by mouth every 6 (six) hours as needed for mild pain (or Fever >/= 101).   metFORMIN 1000 MG tablet Commonly known as: GLUCOPHAGE Take 1,000 mg by mouth daily.   rosuvastatin 10 MG tablet Commonly known as: CRESTOR Take 1 tablet (10 mg total) by mouth daily. Start taking on: December 29, 2019   Trulicity 4.5 MG/0.5ML Sopn Generic drug: Dulaglutide Inject 4.5 mg into the skin once a week.       Discharge Instructions: Please refer to Patient Instructions section of EMR for full details.  Patient was counseled important signs and symptoms that should prompt return to medical care, changes in medications, dietary instructions, activity restrictions, and follow up appointments.   Follow-Up  Appointments: -Patient instructed to call PCP for follow up appointment within 2 weeks. -Referral placed for outpatient cardiology   Maury Dus, MD 12/28/2019, 12:11 PM PGY-1, [facility] Family Medicine

## 2019-12-28 NOTE — Discharge Instructions (Signed)
You were admitted to the hospital with "near-syncope", which is the medical term for almost passing out. We performed several tests including basic labs (A1C, cholesterol, blood counts, electrolytes, etc), an EKG (which looks at the rhythm/electricity of your heart), and an echocardiogram (an ultrasound that looks at the structure of your heart). We also watched you on cardiac monitors overnight.  We think the cause of you passing out/getting dizzy is something called "vasovagal syncope", related to dehydration. We did not see anything wrong with your heart (no evidence of heart failure). However, we have referred you to cardiology and they may opt to do further testing including a stress test or loop monitor.  Please continue taking your diabetes medications. You should STOP your Lasix. Despite your leg swelling, it is good to drink at least 40oz (five 8oz glasses) of water per day, especially when exerting yourself or when you're in the heat.  It is important to follow up with your primary care physician for further management of your diabetes and evaluation of your leg swelling.

## 2020-01-23 ENCOUNTER — Encounter: Payer: Self-pay | Admitting: Cardiology

## 2020-01-23 DIAGNOSIS — M109 Gout, unspecified: Secondary | ICD-10-CM | POA: Insufficient documentation

## 2020-01-23 DIAGNOSIS — I10 Essential (primary) hypertension: Secondary | ICD-10-CM | POA: Insufficient documentation

## 2020-01-23 DIAGNOSIS — F489 Nonpsychotic mental disorder, unspecified: Secondary | ICD-10-CM

## 2020-01-23 DIAGNOSIS — E7800 Pure hypercholesterolemia, unspecified: Secondary | ICD-10-CM

## 2020-01-23 DIAGNOSIS — J301 Allergic rhinitis due to pollen: Secondary | ICD-10-CM

## 2020-01-23 DIAGNOSIS — M1990 Unspecified osteoarthritis, unspecified site: Secondary | ICD-10-CM

## 2020-01-31 ENCOUNTER — Encounter: Payer: Self-pay | Admitting: Cardiology

## 2020-01-31 ENCOUNTER — Ambulatory Visit (INDEPENDENT_AMBULATORY_CARE_PROVIDER_SITE_OTHER): Payer: 59 | Admitting: Cardiology

## 2020-01-31 ENCOUNTER — Ambulatory Visit (INDEPENDENT_AMBULATORY_CARE_PROVIDER_SITE_OTHER): Payer: 59

## 2020-01-31 ENCOUNTER — Other Ambulatory Visit: Payer: Self-pay

## 2020-01-31 VITALS — BP 110/82 | HR 68 | Ht 70.5 in | Wt 249.0 lb

## 2020-01-31 DIAGNOSIS — R5383 Other fatigue: Secondary | ICD-10-CM | POA: Diagnosis not present

## 2020-01-31 DIAGNOSIS — I200 Unstable angina: Secondary | ICD-10-CM

## 2020-01-31 DIAGNOSIS — E1369 Other specified diabetes mellitus with other specified complication: Secondary | ICD-10-CM

## 2020-01-31 DIAGNOSIS — R0602 Shortness of breath: Secondary | ICD-10-CM | POA: Diagnosis not present

## 2020-01-31 DIAGNOSIS — R55 Syncope and collapse: Secondary | ICD-10-CM | POA: Diagnosis not present

## 2020-01-31 DIAGNOSIS — I890 Lymphedema, not elsewhere classified: Secondary | ICD-10-CM

## 2020-01-31 NOTE — Patient Instructions (Addendum)
Medication Instructions:  Your physician recommends that you continue on your current medications as directed. Please refer to the Current Medication list given to you today.  *If you need a refill on your cardiac medications before your next appointment, please call your pharmacy*   Lab Work: Your physician recommends that you return for lab work today: bmp, cbc   If you have labs (blood work) drawn today and your tests are completely normal, you will receive your results only by: Marland Kitchen MyChart Message (if you have MyChart) OR . A paper copy in the mail If you have any lab test that is abnormal or we need to change your treatment, we will call you to review the results.   Testing/Procedures  A zio monitor was ordered today. It will remain on for 7 days. You will then return monitor and event diary in provided box. It takes 1-2 weeks for report to be downloaded and returned to [place]. We will call you with the results. If monitor falls off or has orange flashing light, please call Zio for further instructions.       [facility] CARDIOVASCULAR DIVISION [facility] [place] [address] [place] [address] Dept: [phone] Loc: [phone]  Garold Sheeler  01/31/2020  You are scheduled for a Cardiac Catheterization on Tuesday, October 19 with Dr. Bryan Lemma.  1. Please arrive at the [facility] (Main Entrance A) at [facility]: [address] [place], [place] [address] at 5:30 AM (This time is two hours before your procedure to ensure your preparation). Free valet parking service is available.   Special note: Every effort is made to have your procedure done on time. Please understand that emergencies sometimes delay scheduled procedures.  2. Diet: Do not eat solid foods after midnight.  The patient may have clear liquids until 5am upon the day of the procedure.  3. Labs: You will have labs drawn today.  4. Medication  instructions in preparation for your procedure:     Hold glipizide the day of the procedure.  Hold lasix the day of the procedure.  Hold trulicity the day of the procedure.   Do not take Diabetes Med Glucophage (Metformin) on the day of the procedure and HOLD 48 HOURS AFTER THE PROCEDURE.  On the morning of your procedure, take your Aspirin and any morning medicines NOT listed above.  You may use sips of water.  5. Plan for one night stay--bring personal belongings. 6. Bring a current list of your medications and current insurance cards. 7. You MUST have a responsible person to drive you home. 8. Someone MUST be with you the first 24 hours after you arrive home or your discharge will be delayed. 9. Please wear clothes that are easy to get on and off and wear slip-on shoes.  Thank you for allowing [place] to care for you!   -- [facility]    Follow-Up: At [facility], you and your health needs are our priority.  As part of our continuing mission to provide you with exceptional heart care, we have created designated Provider Care Teams.  These Care Teams include your primary Cardiologist (physician) and Advanced Practice Providers (APPs -  Physician Assistants and Nurse Practitioners) who all work together to provide you with the care you need, when you need it.  We recommend signing up for the patient portal called "MyChart".  Sign up information is provided on this After Visit  Summary.  MyChart is used to connect with patients for Virtual Visits (Telemedicine).  Patients are able to view lab/test results, encounter notes, upcoming appointments, etc.  Non-urgent messages can be sent to your provider as well.   To learn more about what you can do with MyChart, go to [email or web address].    Your next appointment:   November 1st, 2021   Other Instructions   Coronary Angiogram With Stent Coronary angiogram with stent placement is a procedure to widen  or open a narrow blood vessel of the heart (coronary artery). Arteries may become blocked by cholesterol buildup (plaques) in the lining of the artery wall. When a coronary artery becomes partially blocked, blood flow to that area decreases. This may lead to chest pain or a heart attack (myocardial infarction). A stent is a small piece of metal that looks like mesh or spring. Stent placement may be done as treatment after a heart attack, or to prevent a heart attack if a blocked artery is found by a coronary angiogram. Let your health care provider know about:  Any allergies you have, including allergies to medicines or contrast dye.  All medicines you are taking, including vitamins, herbs, eye drops, creams, and over-the-counter medicines.  Any problems you or family members have had with anesthetic medicines.  Any blood disorders you have.  Any surgeries you have had.  Any medical conditions you have, including kidney problems or kidney failure.  Whether you are pregnant or may be pregnant.  Whether you are breastfeeding. What are the risks? Generally, this is a safe procedure. However, serious problems may occur, including:  Damage to nearby structures or organs, such as the heart, blood vessels, or kidneys.  A return of blockage.  Bleeding, infection, or bruising at the insertion site.  A collection of blood under the skin (hematoma) at the insertion site.  A blood clot in another part of the body.  Allergic reaction to medicines or dyes.  Bleeding into the abdomen (retroperitoneal bleeding).  Stroke (rare).  Heart attack (rare). What happens before the procedure? Staying hydrated Follow instructions from your health care provider about hydration, which may include:  Up to 2 hours before the procedure - you may continue to drink clear liquids, such as water, clear fruit juice, black coffee, and plain tea.  Eating and drinking restrictions Follow instructions from  your health care provider about eating and drinking, which may include:  8 hours before the procedure - stop eating heavy meals or foods, such as meat, fried foods, or fatty foods.  6 hours before the procedure - stop eating light meals or foods, such as toast or cereal.  2 hours before the procedure - stop drinking clear liquids. Medicines Ask your health care provider about:  Changing or stopping your regular medicines. This is especially important if you are taking diabetes medicines or blood thinners.  Taking medicines such as aspirin and ibuprofen. These medicines can thin your blood. Do not take these medicines unless your health care provider tells you to take them. ? Generally, aspirin is recommended before a thin tube, called a catheter, is passed through a blood vessel and inserted into the heart (cardiac catheterization).  Taking over-the-counter medicines, vitamins, herbs, and supplements. General instructions  Do not use any products that contain nicotine or tobacco for at least 4 weeks before the procedure. These products include cigarettes, e-cigarettes, and chewing tobacco. If you need help quitting, ask your health care provider.  Plan to have someone take you  home from the hospital or clinic.  If you will be going home right after the procedure, plan to have someone with you for 24 hours.  You may have tests and imaging procedures.  Ask your health care provider: ? How your insertion site will be marked. Ask which artery will be used for the procedure. ? What steps will be taken to help prevent infection. These may include:  Removing hair at the insertion site.  Washing skin with a germ-killing soap.  Taking antibiotic medicine. What happens during the procedure?   An IV will be inserted into one of your veins.  Electrodes may be placed on your chest to monitor your heart rate during the procedure.  You will be given one or more of the following: ? A  medicine to help you relax (sedative). ? A medicine to numb the area (local anesthetic) for catheter insertion.  A small incision will be made for catheter insertion.  The catheter will be inserted into an artery using a guide wire. The location may be in your groin, your wrist, or the fold of your arm (near your elbow).  An X-ray procedure (fluoroscopy) will be used to help guide the catheter to the opening of the heart arteries.  A dye will be injected into the catheter. X-rays will be taken. The dye helps to show where any narrowing or blockages are located in the arteries.  Tell your health care provider if you have chest pain or trouble breathing.  A tiny wire will be guided to the blocked spot, and a balloon will be inflated to make the artery wider.  The stent will be expanded to crush the plaques into the wall of the vessel. The stent will hold the area open and improve the blood flow. Most stents have a drug coating to reduce the risk of the stent narrowing over time.  The artery may be made wider using a drill, laser, or other tools that remove plaques.  The catheter will be removed when the blood flow improves. The stent will stay where it was placed, and the lining of the artery will grow over it.  A bandage (dressing) will be placed on the insertion site. Pressure will be applied to stop bleeding.  The IV will be removed. This procedure may vary among health care providers and hospitals. What happens after the procedure?  Your blood pressure, heart rate, breathing rate, and blood oxygen level will be monitored until you leave the hospital or clinic.  If the procedure is done through the leg, you will lie flat in bed for a few hours or for as long as told by your health care provider. You will be instructed not to bend or cross your legs.  The insertion site and the pulse in your foot or wrist will be checked often.  You may have more blood tests, X-rays, and a test that  records the electrical activity of your heart (electrocardiogram, or ECG).  Do not drive for 24 hours if you were given a sedative during your procedure. Summary  Coronary angiogram with stent placement is a procedure to widen or open a narrowed coronary artery. This is done to treat heart problems.  Before the procedure, let your health care provider know about all the medical conditions and surgeries you have or have had.  This is a safe procedure. However, some problems may occur, including damage to nearby structures or organs, bleeding, blood clots, or allergies.  Follow your health care  provider's instructions about eating, drinking, medicines, and other lifestyle changes, such as quitting tobacco use before the procedure. This information is not intended to replace advice given to you by your health care provider. Make sure you discuss any questions you have with your health care provider. Document Revised: 10/26/2018 Document Reviewed: 10/26/2018 Elsevier Patient Education  2020 [employer].

## 2020-01-31 NOTE — Progress Notes (Addendum)
Cardiology Office Note:    Date:  01/31/2020   ID:  Victor [name], DOB [birth year], MRN [id number]  PCP:  Victor Guard, FNP  Cardiologist:  Thomasene Ripple, DO  Electrophysiologist:  None   Referring MD: Carney Living, *   Chief Complaint  Patient presents with  . Loss of Consciousness    X2 years    History of Present Illness:    Victor [name] is a 49 y.o. male with a hx of chronic diastolic failure, significant bilateral leg edema, hypertension, diabetes mellitus, recurrent syncope and obesity presents today to be evaluated.  Patient tells me that he has been experiencing spontaneous syncope episode for many years.  He notes that most recently he has had 3 episodes of syncope in the last 4 months.  He states he had been at work 1 day he felt as if he was losing all of his energy and the next thing he remembers is the fact that he was sitting on the floor across from a machine he was working on.  He says he had another episode like this and since that time he has been placed out of work.    He also is concerned about his bilateral leg edema he notes that this has been going on since 2014 where he has been placed on multiple diuretic and he has had worsening leg edema without any improvement.  However most recently was spent a big deal for the patient is fact that he experiencing worsening shortness of breath on exertion and recently he has been short of breath at rest as well.  He has had intermittent chest pain but the shortness of breath is worsened that he cannot do any of his activities of daily living as he feels short of breath and has to sit down.  He is really bothered by this because he has lost many family members to areas heart diseases.  Past Medical History:  Diagnosis Date  . Acute on chronic congestive heart failure (HCC)   . Arthritis   . Gout   . Hay fever   . High cholesterol   . Hypertension   . Mental health problem   . Near syncope 12/27/2019  . Syncope  12/26/2019  . Type 2 diabetes mellitus without complication, without long-term current use of insulin [facility])     Past Surgical History:  Procedure Laterality Date  . EYE SURGERY    . HEMORROIDECTOMY    . WISDOM TOOTH EXTRACTION      Current Medications: Current Meds  Medication Sig  . acetaminophen (TYLENOL) 325 MG tablet Take 2 tablets (650 mg total) by mouth every 6 (six) hours as needed for mild pain (or Fever >/= 101).  Marland Kitchen atorvastatin (LIPITOR) 10 MG tablet Take 10 mg by mouth at bedtime.  . furosemide (LASIX) 20 MG tablet Take 20 mg by mouth daily.  . furosemide (LASIX) 40 MG tablet Take 40 mg by mouth daily.  Marland Kitchen glipiZIDE (GLUCOTROL) 5 MG tablet Take 5 mg by mouth daily.  Marland Kitchen losartan (COZAAR) 25 MG tablet Take 25 mg by mouth daily.  . meloxicam (MOBIC) 7.5 MG tablet Take 7.5 mg by mouth daily.  . metFORMIN (GLUCOPHAGE) 1000 MG tablet Take 1,000 mg by mouth 2 (two) times daily with a meal.   . potassium chloride SA (KLOR-CON) 20 MEQ tablet Take 20 mEq by mouth daily.  . TRULICITY 4.5 MG/0.5ML SOPN Inject 4.5 mg into the skin once a week.   . [DISCONTINUED] furosemide (  LASIX) 10 MG/ML solution Take 20 mg by mouth daily.  . [DISCONTINUED] furosemide (LASIX) 20 MG tablet Take 20 mg by mouth in the morning, at noon, and at bedtime.     Allergies:   Shellfish-derived products and Coconut oil   Social History   Socioeconomic History  . Marital status: Married    Spouse name: Not on file  . Number of children: Not on file  . Years of education: Not on file  . Highest education level: Not on file  Occupational History  . Not on file  Tobacco Use  . Smoking status: Former Smoker    Quit date: 04/20/2000    Years since quitting: 19.7  . Smokeless tobacco: Never Used  Vaping Use  . Vaping Use: Never used  Substance and Sexual Activity  . Alcohol use: Never  . Drug use: Never  . Sexual activity: Not on file  Other Topics Concern  . Not on file  Social History Narrative    Works 3rd shift    Social Determinants of Corporate investment bankerHealth   Financial Resource Strain:   . Difficulty of Paying Living Expenses: Not on file  Food Insecurity:   . Worried About Programme researcher, broadcasting/film/videounning Out of Food in the Last Year: Not on file  . Ran Out of Food in the Last Year: Not on file  Transportation Needs:   . Lack of Transportation (Medical): Not on file  . Lack of Transportation (Non-Medical): Not on file  Physical Activity:   . Days of Exercise per Week: Not on file  . Minutes of Exercise per Session: Not on file  Stress:   . Feeling of Stress : Not on file  Social Connections:   . Frequency of Communication with Friends and Family: Not on file  . Frequency of Social Gatherings with Friends and Family: Not on file  . Attends Religious Services: Not on file  . Active Member of Clubs or Organizations: Not on file  . Attends BankerClub or Organization Meetings: Not on file  . Marital Status: Not on file     Family History: The patient's family history includes Diabetes in his mother; Hypertension in his brother, father, maternal grandfather, and paternal grandmother.  ROS:   Review of Systems  Constitution: Negative for decreased appetite, fever and weight gain.  HENT: Negative for congestion, ear discharge, hoarse voice and sore throat.   Eyes: Negative for discharge, redness, vision loss in right eye and visual halos.  Cardiovascular reports intermittent chest pain, dyspnea on exertion, significant leg swelling.  Negative for orthopnea and palpitations.  Respiratory: Negative for cough, hemoptysis, shortness of breath and snoring.   Endocrine: Negative for heat intolerance and polyphagia.  Hematologic/Lymphatic: Negative for bleeding problem. Does not bruise/bleed easily.  Skin: Negative for flushing, nail changes, rash and suspicious lesions.  Musculoskeletal: Negative for arthritis, joint pain, muscle cramps, myalgias, neck pain and stiffness.  Gastrointestinal: Negative for abdominal pain, bowel  incontinence, diarrhea and excessive appetite.  Genitourinary: Negative for decreased libido, genital sores and incomplete emptying.  Neurological: Negative for brief paralysis, focal weakness, headaches and loss of balance.  Psychiatric/Behavioral: Negative for altered mental status, depression and suicidal ideas.  Allergic/Immunologic: Negative for HIV exposure and persistent infections.    EKGs/Labs/Other Studies Reviewed:    The following studies were reviewed today:   EKG:  The ekg ordered today demonstrates sinus rhythm, heart rate 68 bpm.  Compared to prior EKG no significant change.   2D echo IMPRESSIONS  1. Left ventricular ejection fraction,  by estimation, is 60 to 65%. The left ventricle has normal function. The left ventricle has no regional wall motion abnormalities. Left ventricular diastolic parameters were normal.  2. Right ventricular systolic function is normal. The right ventricular size is normal. There is normal pulmonary artery systolic pressure.  3. Cannot r/o small PFO by color.  4. The mitral valve is normal in structure. Trivial mitral valve regurgitation. No evidence of mitral stenosis.  5. The aortic valve is normal in structure. Aortic valve regurgitation is not visualized. Mild to moderate aortic valve sclerosis/calcification is present, without any evidence of aortic stenosis.  6. The inferior vena cava is normal in size with greater than 50% respiratory variability, suggesting right atrial pressure of 3 mmHg.   FINDINGS  Left Ventricle: Left ventricular ejection fraction, by estimation, is 60 to 65%. The left ventricle has normal function. The left ventricle has no regional wall motion abnormalities. The left ventricular internal cavity size was normal in size. There is no left ventricular hypertrophy. Left ventricular diastolic parameters were normal.   Right Ventricle: The right ventricular size is normal. No increase in right ventricular wall  thickness. Right ventricular systolic function is normal. There is normal pulmonary artery systolic pressure. The tricuspid regurgitant velocity is 1.94 m/s, and with an assumed right atrial pressure of 3 mmHg, the estimated right ventricular systolic pressure is 18.1 mmHg.   Left Atrium: Left atrial size was normal in size.   Right Atrium: Right atrial size was normal in size.   Pericardium: There is no evidence of pericardial effusion.   Mitral Valve: The mitral valve is normal in structure. Trivial mitral valve regurgitation. No evidence of mitral valve stenosis.   Tricuspid Valve: The tricuspid valve is normal in structure. Tricuspid valve regurgitation is not demonstrated. No evidence of tricuspid stenosis.   Aortic Valve: The aortic valve is normal in structure. Aortic valve regurgitation is not visualized. Mild to moderate aortic valve sclerosis/calcification is present, without any evidence of aortic stenosis.   Pulmonic Valve: The pulmonic valve was normal in structure. Pulmonic valve regurgitation is not visualized. No evidence of pulmonic stenosis.   Aorta: The aortic root is normal in size and structure.   Venous: The inferior vena cava is normal in size with greater than 50%  respiratory variability, suggesting right atrial pressure of 3 mmHg.   IAS/Shunts: The interatrial septum was not well visualized.     Recent Labs: 12/27/2019: B Natriuretic Peptide 173.8; Magnesium 1.8; TSH 1.969 12/28/2019: ALT 30; BUN 13; Creatinine, Ser 0.94; Hemoglobin 14.8; Platelets 230; Potassium 3.9; Sodium 135  Recent Lipid Panel    Component Value Date/Time   CHOL 168 12/27/2019 1228   TRIG 185 (H) 12/27/2019 1228   HDL 32 (L) 12/27/2019 1228   CHOLHDL 5.3 12/27/2019 1228   VLDL 37 12/27/2019 1228   LDLCALC 99 12/27/2019 1228    Physical Exam:    VS:  BP 110/82 (BP Location: Left Arm, Patient Position: Sitting, Cuff Size: Large)   Pulse 68   Ht 5' 10.5" (1.791 m)   Wt 249 lb  (112.9 kg)   SpO2 97%   BMI 35.22 kg/m     Wt Readings from Last 3 Encounters:  01/31/20 249 lb (112.9 kg)  01/05/20 243 lb 2.7 oz (110.3 kg)  12/28/19 234 lb 6.4 oz (106.3 kg)     GEN: Well nourished, well developed in no acute distress HEENT: Normal NECK: No JVD; No carotid bruits LYMPHATICS: No lymphadenopathy CARDIAC: S1S2 noted,RRR, no  murmurs, rubs, gallops RESPIRATORY:  Clear to auscultation without rales, wheezing or rhonchi  ABDOMEN: Soft, non-tender, non-distended, +bowel sounds, no guarding. EXTREMITIES: Tree trunks bilateral edema, No cyanosis, no clubbing MUSCULOSKELETAL:  No deformity  SKIN: Warm and dry NEUROLOGIC:  Alert and oriented x 3, non-focal PSYCHIATRIC:  Normal affect, good insight  ASSESSMENT:    1. Shortness of breath   2. Unstable angina (HCC)   3. Fatigue, unspecified type   4. Other specified diabetes mellitus with other specified complication, unspecified whether long term insulin use (HCC)    PLAN:    His shortness of breath is concerning and the fact that is worsening  This could certainly be multifactorial but given  his significant risk factors including diabetes for coronary artery disease therefore I really would like to proceed with a right and left heart catheterization in this patient because given his symptoms and his risk factors and negative stress test would really not answer the question.  The patient understands that risks include but are not limited to stroke (1 in 1000), death (1 in 1000), kidney failure [usually temporary] (1 in 500), bleeding (1 in 200), allergic reaction [possibly serious] (1 in 200), and agrees to proceed.  He is on Lasix 60 mg daily. He has tried multiple diuretics including Bumex and torsemide he tells me over the years since 2014. I do not suspect that this significant bilateral edema is all from his diastolic heart failure.  His clinical exam shows evidence of lymphedema  And nephrotic syndromesyndrome has  not been excluded in this patient.  He is not wearing compression stockings today but he tells me he uses that every day and is not help. I like to refer the patient to our vascular vein surgeons for evaluation at this time. He also will benefit from an evaluation by Nephrology to assess for any aspect of nephrotic syndrome as well.  He has uncontrolled diabetes and he tells me he has never seen a endocrinologist I will refer him to endocrinology as it would be beneficial to the patient to be managed under the care of endocrinology given his uncontrolled diabetes.  His blood pressure is acceptable in the office no changes will be made to his antihypertensive regimen.  Continue patient on Lipitor.  The patient is in agreement with the above plan. The patient left the office in stable condition.  The patient will follow up in 2 to 3 weeks.   Medication Adjustments/Labs and Tests Ordered: Current medicines are reviewed at length with the patient today.  Concerns regarding medicines are outlined above.  Orders Placed This Encounter  Procedures  . Basic metabolic panel  . CBC  . Ambulatory referral to Endocrinology  . Ambulatory referral to Vascular Surgery  . LONG TERM MONITOR (3-14 DAYS)  . EKG 12-Lead   No orders of the defined types were placed in this encounter.   Patient Instructions  Medication Instructions:  Your physician recommends that you continue on your current medications as directed. Please refer to the Current Medication list given to you today.  *If you need a refill on your cardiac medications before your next appointment, please call your pharmacy*   Lab Work: Your physician recommends that you return for lab work today: bmp, cbc   If you have labs (blood work) drawn today and your tests are completely normal, you will receive your results only by: Marland Kitchen MyChart Message (if you have MyChart) OR . A paper copy in the mail If you have  any lab test that is abnormal or we  need to change your treatment, we will call you to review the results.   Testing/Procedures  A zio monitor was ordered today. It will remain on for 7 days. You will then return monitor and event diary in provided box. It takes 1-2 weeks for report to be downloaded and returned to [place]. We will call you with the results. If monitor falls off or has orange flashing light, please call Zio for further instructions.       [facility] CARDIOVASCULAR DIVISION [facility] [place] [address] [place] [address] Dept: [phone] Loc: [phone]  Victor [name]  01/31/2020  You are scheduled for a Cardiac Catheterization on Tuesday, October 19 with Dr. Bryan Lemma.  1. Please arrive at the [facility] (Main Entrance A) at [facility]: [address] [place], [place] [address] at 5:30 AM (This time is two hours before your procedure to ensure your preparation). Free valet parking service is available.   Special note: Every effort is made to have your procedure done on time. Please understand that emergencies sometimes delay scheduled procedures.  2. Diet: Do not eat solid foods after midnight.  The patient may have clear liquids until 5am upon the day of the procedure.  3. Labs: You will have labs drawn today.  4. Medication instructions in preparation for your procedure:     Hold glipizide the day of the procedure.  Hold lasix the day of the procedure.  Hold trulicity the day of the procedure.   Do not take Diabetes Med Glucophage (Metformin) on the day of the procedure and HOLD 48 HOURS AFTER THE PROCEDURE.  On the morning of your procedure, take your Aspirin and any morning medicines NOT listed above.  You may use sips of water.  5. Plan for one night stay--bring personal belongings. 6. Bring a current list of your medications and current insurance cards. 7. You MUST have a responsible person to drive you home. 8.  Someone MUST be with you the first 24 hours after you arrive home or your discharge will be delayed. 9. Please wear clothes that are easy to get on and off and wear slip-on shoes.  Thank you for allowing [place] to care for you!   -- [facility]    Follow-Up: At [facility], you and your health needs are our priority.  As part of our continuing mission to provide you with exceptional heart care, we have created designated Provider Care Teams.  These Care Teams include your primary Cardiologist (physician) and Advanced Practice Providers (APPs -  Physician Assistants and Nurse Practitioners) who all work together to provide you with the care you need, when you need it.  We recommend signing up for the patient portal called "MyChart".  Sign up information is provided on this After Visit Summary.  MyChart is used to connect with patients for Virtual Visits (Telemedicine).  Patients are able to view lab/test results, encounter notes, upcoming appointments, etc.  Non-urgent messages can be sent to your provider as well.   To learn more about what you can do with MyChart, go to [email or web address].    Your next appointment:   November 1st, 2021   Other Instructions   Coronary Angiogram With Stent Coronary angiogram with stent placement is a procedure to widen or open a narrow blood vessel of the heart (coronary artery). Arteries may become blocked by cholesterol  buildup (plaques) in the lining of the artery wall. When a coronary artery becomes partially blocked, blood flow to that area decreases. This may lead to chest pain or a heart attack (myocardial infarction). A stent is a small piece of metal that looks like mesh or spring. Stent placement may be done as treatment after a heart attack, or to prevent a heart attack if a blocked artery is found by a coronary angiogram. Let your health care provider know about:  Any allergies you have, including allergies to  medicines or contrast dye.  All medicines you are taking, including vitamins, herbs, eye drops, creams, and over-the-counter medicines.  Any problems you or family members have had with anesthetic medicines.  Any blood disorders you have.  Any surgeries you have had.  Any medical conditions you have, including kidney problems or kidney failure.  Whether you are pregnant or may be pregnant.  Whether you are breastfeeding. What are the risks? Generally, this is a safe procedure. However, serious problems may occur, including:  Damage to nearby structures or organs, such as the heart, blood vessels, or kidneys.  A return of blockage.  Bleeding, infection, or bruising at the insertion site.  A collection of blood under the skin (hematoma) at the insertion site.  A blood clot in another part of the body.  Allergic reaction to medicines or dyes.  Bleeding into the abdomen (retroperitoneal bleeding).  Stroke (rare).  Heart attack (rare). What happens before the procedure? Staying hydrated Follow instructions from your health care provider about hydration, which may include:  Up to 2 hours before the procedure - you may continue to drink clear liquids, such as water, clear fruit juice, black coffee, and plain tea.  Eating and drinking restrictions Follow instructions from your health care provider about eating and drinking, which may include:  8 hours before the procedure - stop eating heavy meals or foods, such as meat, fried foods, or fatty foods.  6 hours before the procedure - stop eating light meals or foods, such as toast or cereal.  2 hours before the procedure - stop drinking clear liquids. Medicines Ask your health care provider about:  Changing or stopping your regular medicines. This is especially important if you are taking diabetes medicines or blood thinners.  Taking medicines such as aspirin and ibuprofen. These medicines can thin your blood. Do not take  these medicines unless your health care provider tells you to take them. ? Generally, aspirin is recommended before a thin tube, called a catheter, is passed through a blood vessel and inserted into the heart (cardiac catheterization).  Taking over-the-counter medicines, vitamins, herbs, and supplements. General instructions  Do not use any products that contain nicotine or tobacco for at least 4 weeks before the procedure. These products include cigarettes, e-cigarettes, and chewing tobacco. If you need help quitting, ask your health care provider.  Plan to have someone take you home from the hospital or clinic.  If you will be going home right after the procedure, plan to have someone with you for 24 hours.  You may have tests and imaging procedures.  Ask your health care provider: ? How your insertion site will be marked. Ask which artery will be used for the procedure. ? What steps will be taken to help prevent infection. These may include:  Removing hair at the insertion site.  Washing skin with a germ-killing soap.  Taking antibiotic medicine. What happens during the procedure?   An IV will be inserted  into one of your veins.  Electrodes may be placed on your chest to monitor your heart rate during the procedure.  You will be given one or more of the following: ? A medicine to help you relax (sedative). ? A medicine to numb the area (local anesthetic) for catheter insertion.  A small incision will be made for catheter insertion.  The catheter will be inserted into an artery using a guide wire. The location may be in your groin, your wrist, or the fold of your arm (near your elbow).  An X-ray procedure (fluoroscopy) will be used to help guide the catheter to the opening of the heart arteries.  A dye will be injected into the catheter. X-rays will be taken. The dye helps to show where any narrowing or blockages are located in the arteries.  Tell your health care provider  if you have chest pain or trouble breathing.  A tiny wire will be guided to the blocked spot, and a balloon will be inflated to make the artery wider.  The stent will be expanded to crush the plaques into the wall of the vessel. The stent will hold the area open and improve the blood flow. Most stents have a drug coating to reduce the risk of the stent narrowing over time.  The artery may be made wider using a drill, laser, or other tools that remove plaques.  The catheter will be removed when the blood flow improves. The stent will stay where it was placed, and the lining of the artery will grow over it.  A bandage (dressing) will be placed on the insertion site. Pressure will be applied to stop bleeding.  The IV will be removed. This procedure may vary among health care providers and hospitals. What happens after the procedure?  Your blood pressure, heart rate, breathing rate, and blood oxygen level will be monitored until you leave the hospital or clinic.  If the procedure is done through the leg, you will lie flat in bed for a few hours or for as long as told by your health care provider. You will be instructed not to bend or cross your legs.  The insertion site and the pulse in your foot or wrist will be checked often.  You may have more blood tests, X-rays, and a test that records the electrical activity of your heart (electrocardiogram, or ECG).  Do not drive for 24 hours if you were given a sedative during your procedure. Summary  Coronary angiogram with stent placement is a procedure to widen or open a narrowed coronary artery. This is done to treat heart problems.  Before the procedure, let your health care provider know about all the medical conditions and surgeries you have or have had.  This is a safe procedure. However, some problems may occur, including damage to nearby structures or organs, bleeding, blood clots, or allergies.  Follow your health care provider's  instructions about eating, drinking, medicines, and other lifestyle changes, such as quitting tobacco use before the procedure. This information is not intended to replace advice given to you by your health care provider. Make sure you discuss any questions you have with your health care provider. Document Revised: 10/26/2018 Document Reviewed: 10/26/2018 Elsevier Patient Education  2020 [employer].      Adopting a Healthy Lifestyle.  Know what a healthy weight is for you (roughly BMI <25) and aim to maintain this   Aim for 7+ servings of fruits and vegetables daily   65-80+  fluid ounces of water or unsweet tea for healthy kidneys   Limit to max 1 drink of alcohol per day; avoid smoking/tobacco   Limit animal fats in diet for cholesterol and heart health - choose grass fed whenever available   Avoid highly processed foods, and foods high in saturated/trans fats   Aim for low stress - take time to unwind and care for your mental health   Aim for 150 min of moderate intensity exercise weekly for heart health, and weights twice weekly for bone health   Aim for 7-9 hours of sleep daily   When it comes to diets, agreement about the perfect plan isnt easy to find, even among the experts. Experts at the [facility] [employer] developed an idea known as the Healthy Eating Plate. Just imagine a plate divided into logical, healthy portions.   The emphasis is on diet quality:   Load up on vegetables and fruits - one-half of your plate: Aim for color and variety, and remember that potatoes dont count.   Go for whole grains - one-quarter of your plate: Whole wheat, barley, wheat berries, quinoa, oats, brown rice, and foods made with them. If you want pasta, go with whole wheat pasta.   Protein power - one-quarter of your plate: Fish, chicken, beans, and nuts are all healthy, versatile protein sources. Limit red meat.   The diet, however, does go beyond the plate, offering a  few other suggestions.   Use healthy plant oils, such as olive, canola, soy, corn, sunflower and peanut. Check the labels, and avoid partially hydrogenated oil, which have unhealthy trans fats.   If youre thirsty, drink water. Coffee and tea are good in moderation, but skip sugary drinks and limit milk and dairy products to one or two daily servings.   The type of carbohydrate in the diet is more important than the amount. Some sources of carbohydrates, such as vegetables, fruits, whole grains, and beans-are healthier than others.   Finally, stay active  Signed, Thomasene Ripple, DO  01/31/2020 2:39 PM    [facility]

## 2020-01-31 NOTE — H&P (View-Only) (Signed)
Cardiology Office Note:    Date:  01/31/2020   ID:  Victor [name], DOB 02/20/1971, MRN [id number]  PCP:  Ntibrey, Daniel K, FNP  Cardiologist:  Tejah Brekke, DO  Electrophysiologist:  None   Referring MD: Chambliss, Marshall L, *   Chief Complaint  Patient presents with  . Loss of Consciousness    X2 years    History of Present Illness:    Victor [name] is a 49 y.o. male with a hx of chronic diastolic failure, significant bilateral leg edema, hypertension, diabetes mellitus, recurrent syncope and obesity presents today to be evaluated.  Patient tells me that he has been experiencing spontaneous syncope episode for many years.  He notes that most recently he has had 3 episodes of syncope in the last 4 months.  He states he had been at work 1 day he felt as if he was losing all of his energy and the next thing he remembers is the fact that he was sitting on the floor across from a machine he was working on.  He says he had another episode like this and since that time he has been placed out of work.    He also is concerned about his bilateral leg edema he notes that this has been going on since 2014 where he has been placed on multiple diuretic and he has had worsening leg edema without any improvement.  However most recently was spent a big deal for the patient is fact that he experiencing worsening shortness of breath on exertion and recently he has been short of breath at rest as well.  He has had intermittent chest pain but the shortness of breath is worsened that he cannot do any of his activities of daily living as he feels short of breath and has to sit down.  He is really bothered by this because he has lost many family members to areas heart diseases.  Past Medical History:  Diagnosis Date  . Acute on chronic congestive heart failure (HCC)   . Arthritis   . Gout   . Hay fever   . High cholesterol   . Hypertension   . Mental health problem   . Near syncope 12/27/2019  . Syncope  12/26/2019  . Type 2 diabetes mellitus without complication, without long-term current use of insulin (HCC)     Past Surgical History:  Procedure Laterality Date  . EYE SURGERY    . HEMORROIDECTOMY    . WISDOM TOOTH EXTRACTION      Current Medications: Current Meds  Medication Sig  . acetaminophen (TYLENOL) 325 MG tablet Take 2 tablets (650 mg total) by mouth every 6 (six) hours as needed for mild pain (or Fever >/= 101).  . atorvastatin (LIPITOR) 10 MG tablet Take 10 mg by mouth at bedtime.  . furosemide (LASIX) 20 MG tablet Take 20 mg by mouth daily.  . furosemide (LASIX) 40 MG tablet Take 40 mg by mouth daily.  . glipiZIDE (GLUCOTROL) 5 MG tablet Take 5 mg by mouth daily.  . losartan (COZAAR) 25 MG tablet Take 25 mg by mouth daily.  . meloxicam (MOBIC) 7.5 MG tablet Take 7.5 mg by mouth daily.  . metFORMIN (GLUCOPHAGE) 1000 MG tablet Take 1,000 mg by mouth 2 (two) times daily with a meal.   . potassium chloride SA (KLOR-CON) 20 MEQ tablet Take 20 mEq by mouth daily.  . TRULICITY 4.5 MG/0.5ML SOPN Inject 4.5 mg into the skin once a week.   . [DISCONTINUED] furosemide (  LASIX) 10 MG/ML solution Take 20 mg by mouth daily.  . [DISCONTINUED] furosemide (LASIX) 20 MG tablet Take 20 mg by mouth in the morning, at noon, and at bedtime.     Allergies:   Shellfish-derived products and Coconut oil   Social History   Socioeconomic History  . Marital status: Married    Spouse name: Not on file  . Number of children: Not on file  . Years of education: Not on file  . Highest education level: Not on file  Occupational History  . Not on file  Tobacco Use  . Smoking status: Former Smoker    Quit date: 04/20/2000    Years since quitting: 19.7  . Smokeless tobacco: Never Used  Vaping Use  . Vaping Use: Never used  Substance and Sexual Activity  . Alcohol use: Never  . Drug use: Never  . Sexual activity: Not on file  Other Topics Concern  . Not on file  Social History Narrative    Works 3rd shift    Social Determinants of Health   Financial Resource Strain:   . Difficulty of Paying Living Expenses: Not on file  Food Insecurity:   . Worried About Running Out of Food in the Last Year: Not on file  . Ran Out of Food in the Last Year: Not on file  Transportation Needs:   . Lack of Transportation (Medical): Not on file  . Lack of Transportation (Non-Medical): Not on file  Physical Activity:   . Days of Exercise per Week: Not on file  . Minutes of Exercise per Session: Not on file  Stress:   . Feeling of Stress : Not on file  Social Connections:   . Frequency of Communication with Friends and Family: Not on file  . Frequency of Social Gatherings with Friends and Family: Not on file  . Attends Religious Services: Not on file  . Active Member of Clubs or Organizations: Not on file  . Attends Club or Organization Meetings: Not on file  . Marital Status: Not on file     Family History: The patient's family history includes Diabetes in his mother; Hypertension in his brother, father, maternal grandfather, and paternal grandmother.  ROS:   Review of Systems  Constitution: Negative for decreased appetite, fever and weight gain.  HENT: Negative for congestion, ear discharge, hoarse voice and sore throat.   Eyes: Negative for discharge, redness, vision loss in right eye and visual halos.  Cardiovascular reports intermittent chest pain, dyspnea on exertion, significant leg swelling.  Negative for orthopnea and palpitations.  Respiratory: Negative for cough, hemoptysis, shortness of breath and snoring.   Endocrine: Negative for heat intolerance and polyphagia.  Hematologic/Lymphatic: Negative for bleeding problem. Does not bruise/bleed easily.  Skin: Negative for flushing, nail changes, rash and suspicious lesions.  Musculoskeletal: Negative for arthritis, joint pain, muscle cramps, myalgias, neck pain and stiffness.  Gastrointestinal: Negative for abdominal pain, bowel  incontinence, diarrhea and excessive appetite.  Genitourinary: Negative for decreased libido, genital sores and incomplete emptying.  Neurological: Negative for brief paralysis, focal weakness, headaches and loss of balance.  Psychiatric/Behavioral: Negative for altered mental status, depression and suicidal ideas.  Allergic/Immunologic: Negative for HIV exposure and persistent infections.    EKGs/Labs/Other Studies Reviewed:    The following studies were reviewed today:   EKG:  The ekg ordered today demonstrates sinus rhythm, heart rate 68 bpm.  Compared to prior EKG no significant change.   2D echo IMPRESSIONS  1. Left ventricular ejection fraction,   by estimation, is 60 to 65%. The left ventricle has normal function. The left ventricle has no regional wall motion abnormalities. Left ventricular diastolic parameters were normal.  2. Right ventricular systolic function is normal. The right ventricular size is normal. There is normal pulmonary artery systolic pressure.  3. Cannot r/o small PFO by color.  4. The mitral valve is normal in structure. Trivial mitral valve regurgitation. No evidence of mitral stenosis.  5. The aortic valve is normal in structure. Aortic valve regurgitation is not visualized. Mild to moderate aortic valve sclerosis/calcification is present, without any evidence of aortic stenosis.  6. The inferior vena cava is normal in size with greater than 50% respiratory variability, suggesting right atrial pressure of 3 mmHg.   FINDINGS  Left Ventricle: Left ventricular ejection fraction, by estimation, is 60 to 65%. The left ventricle has normal function. The left ventricle has no regional wall motion abnormalities. The left ventricular internal cavity size was normal in size. There is no left ventricular hypertrophy. Left ventricular diastolic parameters were normal.   Right Ventricle: The right ventricular size is normal. No increase in right ventricular wall  thickness. Right ventricular systolic function is normal. There is normal pulmonary artery systolic pressure. The tricuspid regurgitant velocity is 1.94 m/s, and with an assumed right atrial pressure of 3 mmHg, the estimated right ventricular systolic pressure is 18.1 mmHg.   Left Atrium: Left atrial size was normal in size.   Right Atrium: Right atrial size was normal in size.   Pericardium: There is no evidence of pericardial effusion.   Mitral Valve: The mitral valve is normal in structure. Trivial mitral valve regurgitation. No evidence of mitral valve stenosis.   Tricuspid Valve: The tricuspid valve is normal in structure. Tricuspid valve regurgitation is not demonstrated. No evidence of tricuspid stenosis.   Aortic Valve: The aortic valve is normal in structure. Aortic valve regurgitation is not visualized. Mild to moderate aortic valve sclerosis/calcification is present, without any evidence of aortic stenosis.   Pulmonic Valve: The pulmonic valve was normal in structure. Pulmonic valve regurgitation is not visualized. No evidence of pulmonic stenosis.   Aorta: The aortic root is normal in size and structure.   Venous: The inferior vena cava is normal in size with greater than 50%  respiratory variability, suggesting right atrial pressure of 3 mmHg.   IAS/Shunts: The interatrial septum was not well visualized.     Recent Labs: 12/27/2019: B Natriuretic Peptide 173.8; Magnesium 1.8; TSH 1.969 12/28/2019: ALT 30; BUN 13; Creatinine, Ser 0.94; Hemoglobin 14.8; Platelets 230; Potassium 3.9; Sodium 135  Recent Lipid Panel    Component Value Date/Time   CHOL 168 12/27/2019 1228   TRIG 185 (H) 12/27/2019 1228   HDL 32 (L) 12/27/2019 1228   CHOLHDL 5.3 12/27/2019 1228   VLDL 37 12/27/2019 1228   LDLCALC 99 12/27/2019 1228    Physical Exam:    VS:  BP 110/82 (BP Location: Left Arm, Patient Position: Sitting, Cuff Size: Large)   Pulse 68   Ht 5' 10.5" (1.791 m)   Wt 249 lb  (112.9 kg)   SpO2 97%   BMI 35.22 kg/m     Wt Readings from Last 3 Encounters:  01/31/20 249 lb (112.9 kg)  01/05/20 243 lb 2.7 oz (110.3 kg)  12/28/19 234 lb 6.4 oz (106.3 kg)     GEN: Well nourished, well developed in no acute distress HEENT: Normal NECK: No JVD; No carotid bruits LYMPHATICS: No lymphadenopathy CARDIAC: S1S2 noted,RRR, no   murmurs, rubs, gallops RESPIRATORY:  Clear to auscultation without rales, wheezing or rhonchi  ABDOMEN: Soft, non-tender, non-distended, +bowel sounds, no guarding. EXTREMITIES: Tree trunks bilateral edema, No cyanosis, no clubbing MUSCULOSKELETAL:  No deformity  SKIN: Warm and dry NEUROLOGIC:  Alert and oriented x 3, non-focal PSYCHIATRIC:  Normal affect, good insight  ASSESSMENT:    1. Shortness of breath   2. Unstable angina (HCC)   3. Fatigue, unspecified type   4. Other specified diabetes mellitus with other specified complication, unspecified whether long term insulin use (HCC)    PLAN:    His shortness of breath is concerning and the fact that is worsening  This could certainly be multifactorial but given  his significant risk factors including diabetes for coronary artery disease therefore I really would like to proceed with a right and left heart catheterization in this patient because given his symptoms and his risk factors and negative stress test would really not answer the question.  The patient understands that risks include but are not limited to stroke (1 in 1000), death (1 in 1000), kidney failure [usually temporary] (1 in 500), bleeding (1 in 200), allergic reaction [possibly serious] (1 in 200), and agrees to proceed.  He is on Lasix 60 mg daily. He has tried multiple diuretics including Bumex and torsemide he tells me over the years since 2014. I do not suspect that this significant bilateral edema is all from his diastolic heart failure.  His clinical exam shows evidence of lymphedema  And nephrotic syndromesyndrome has  not been excluded in this patient.  He is not wearing compression stockings today but he tells me he uses that every day and is not help. I like to refer the patient to our vascular vein surgeons for evaluation at this time. He also will benefit from an evaluation by Nephrology to assess for any aspect of nephrotic syndrome as well.  He has uncontrolled diabetes and he tells me he has never seen a endocrinologist I will refer him to endocrinology as it would be beneficial to the patient to be managed under the care of endocrinology given his uncontrolled diabetes.  His blood pressure is acceptable in the office no changes will be made to his antihypertensive regimen.  Continue patient on Lipitor.  The patient is in agreement with the above plan. The patient left the office in stable condition.  The patient will follow up in 2 to 3 weeks.   Medication Adjustments/Labs and Tests Ordered: Current medicines are reviewed at length with the patient today.  Concerns regarding medicines are outlined above.  Orders Placed This Encounter  Procedures  . Basic metabolic panel  . CBC  . Ambulatory referral to Endocrinology  . Ambulatory referral to Vascular Surgery  . LONG TERM MONITOR (3-14 DAYS)  . EKG 12-Lead   No orders of the defined types were placed in this encounter.   Patient Instructions  Medication Instructions:  Your physician recommends that you continue on your current medications as directed. Please refer to the Current Medication list given to you today.  *If you need a refill on your cardiac medications before your next appointment, please call your pharmacy*   Lab Work: Your physician recommends that you return for lab work today: bmp, cbc   If you have labs (blood work) drawn today and your tests are completely normal, you will receive your results only by: . MyChart Message (if you have MyChart) OR . A paper copy in the mail If you have   any lab test that is abnormal or we  need to change your treatment, we will call you to review the results.   Testing/Procedures  A zio monitor was ordered today. It will remain on for 7 days. You will then return monitor and event diary in provided box. It takes 1-2 weeks for report to be downloaded and returned to us. We will call you with the results. If monitor falls off or has orange flashing light, please call Zio for further instructions.       [facility] [place] [address], [address] [place] [address] Dept: [phone] Loc: [phone]  Victor [name]  01/31/2020  You are scheduled for a Cardiac Catheterization on Tuesday, October 19 with Dr. David Harding.  1. Please arrive at the [facility] (Main Entrance A) at [facility]: [address] [place], [place] [address] at 5:30 AM (This time is two hours before your procedure to ensure your preparation). Free valet parking service is available.   Special note: Every effort is made to have your procedure done on time. Please understand that emergencies sometimes delay scheduled procedures.  2. Diet: Do not eat solid foods after midnight.  The patient may have clear liquids until 5am upon the day of the procedure.  3. Labs: You will have labs drawn today.  4. Medication instructions in preparation for your procedure:     Hold glipizide the day of the procedure.  Hold lasix the day of the procedure.  Hold trulicity the day of the procedure.   Do not take Diabetes Med Glucophage (Metformin) on the day of the procedure and HOLD 48 HOURS AFTER THE PROCEDURE.  On the morning of your procedure, take your Aspirin and any morning medicines NOT listed above.  You may use sips of water.  5. Plan for one night stay--bring personal belongings. 6. Bring a current list of your medications and current insurance cards. 7. You MUST have a responsible person to drive you home. 8.  Someone MUST be with you the first 24 hours after you arrive home or your discharge will be delayed. 9. Please wear clothes that are easy to get on and off and wear slip-on shoes.  Thank you for allowing us to care for you!   -- [facility]    Follow-Up: At [facility], you and your health needs are our priority.  As part of our continuing mission to provide you with exceptional heart care, we have created designated Provider Care Teams.  These Care Teams include your primary Cardiologist (physician) and Advanced Practice Providers (APPs -  Physician Assistants and Nurse Practitioners) who all work together to provide you with the care you need, when you need it.  We recommend signing up for the patient portal called "MyChart".  Sign up information is provided on this After Visit Summary.  MyChart is used to connect with patients for Virtual Visits (Telemedicine).  Patients are able to view lab/test results, encounter notes, upcoming appointments, etc.  Non-urgent messages can be sent to your provider as well.   To learn more about what you can do with MyChart, go to [email or web address].    Your next appointment:   November 1st, 2021   Other Instructions   Coronary Angiogram With Stent Coronary angiogram with stent placement is a procedure to widen or open a narrow blood vessel of the heart (coronary artery). Arteries may become blocked by cholesterol   buildup (plaques) in the lining of the artery wall. When a coronary artery becomes partially blocked, blood flow to that area decreases. This may lead to chest pain or a heart attack (myocardial infarction). A stent is a small piece of metal that looks like mesh or spring. Stent placement may be done as treatment after a heart attack, or to prevent a heart attack if a blocked artery is found by a coronary angiogram. Let your health care provider know about:  Any allergies you have, including allergies to  medicines or contrast dye.  All medicines you are taking, including vitamins, herbs, eye drops, creams, and over-the-counter medicines.  Any problems you or family members have had with anesthetic medicines.  Any blood disorders you have.  Any surgeries you have had.  Any medical conditions you have, including kidney problems or kidney failure.  Whether you are pregnant or may be pregnant.  Whether you are breastfeeding. What are the risks? Generally, this is a safe procedure. However, serious problems may occur, including:  Damage to nearby structures or organs, such as the heart, blood vessels, or kidneys.  A return of blockage.  Bleeding, infection, or bruising at the insertion site.  A collection of blood under the skin (hematoma) at the insertion site.  A blood clot in another part of the body.  Allergic reaction to medicines or dyes.  Bleeding into the abdomen (retroperitoneal bleeding).  Stroke (rare).  Heart attack (rare). What happens before the procedure? Staying hydrated Follow instructions from your health care provider about hydration, which may include:  Up to 2 hours before the procedure - you may continue to drink clear liquids, such as water, clear fruit juice, black coffee, and plain tea.  Eating and drinking restrictions Follow instructions from your health care provider about eating and drinking, which may include:  8 hours before the procedure - stop eating heavy meals or foods, such as meat, fried foods, or fatty foods.  6 hours before the procedure - stop eating light meals or foods, such as toast or cereal.  2 hours before the procedure - stop drinking clear liquids. Medicines Ask your health care provider about:  Changing or stopping your regular medicines. This is especially important if you are taking diabetes medicines or blood thinners.  Taking medicines such as aspirin and ibuprofen. These medicines can thin your blood. Do not take  these medicines unless your health care provider tells you to take them. ? Generally, aspirin is recommended before a thin tube, called a catheter, is passed through a blood vessel and inserted into the heart (cardiac catheterization).  Taking over-the-counter medicines, vitamins, herbs, and supplements. General instructions  Do not use any products that contain nicotine or tobacco for at least 4 weeks before the procedure. These products include cigarettes, e-cigarettes, and chewing tobacco. If you need help quitting, ask your health care provider.  Plan to have someone take you home from the hospital or clinic.  If you will be going home right after the procedure, plan to have someone with you for 24 hours.  You may have tests and imaging procedures.  Ask your health care provider: ? How your insertion site will be marked. Ask which artery will be used for the procedure. ? What steps will be taken to help prevent infection. These may include:  Removing hair at the insertion site.  Washing skin with a germ-killing soap.  Taking antibiotic medicine. What happens during the procedure?   An IV will be inserted   into one of your veins.  Electrodes may be placed on your chest to monitor your heart rate during the procedure.  You will be given one or more of the following: ? A medicine to help you relax (sedative). ? A medicine to numb the area (local anesthetic) for catheter insertion.  A small incision will be made for catheter insertion.  The catheter will be inserted into an artery using a guide wire. The location may be in your groin, your wrist, or the fold of your arm (near your elbow).  An X-ray procedure (fluoroscopy) will be used to help guide the catheter to the opening of the heart arteries.  A dye will be injected into the catheter. X-rays will be taken. The dye helps to show where any narrowing or blockages are located in the arteries.  Tell your health care provider  if you have chest pain or trouble breathing.  A tiny wire will be guided to the blocked spot, and a balloon will be inflated to make the artery wider.  The stent will be expanded to crush the plaques into the wall of the vessel. The stent will hold the area open and improve the blood flow. Most stents have a drug coating to reduce the risk of the stent narrowing over time.  The artery may be made wider using a drill, laser, or other tools that remove plaques.  The catheter will be removed when the blood flow improves. The stent will stay where it was placed, and the lining of the artery will grow over it.  A bandage (dressing) will be placed on the insertion site. Pressure will be applied to stop bleeding.  The IV will be removed. This procedure may vary among health care providers and hospitals. What happens after the procedure?  Your blood pressure, heart rate, breathing rate, and blood oxygen level will be monitored until you leave the hospital or clinic.  If the procedure is done through the leg, you will lie flat in bed for a few hours or for as long as told by your health care provider. You will be instructed not to bend or cross your legs.  The insertion site and the pulse in your foot or wrist will be checked often.  You may have more blood tests, X-rays, and a test that records the electrical activity of your heart (electrocardiogram, or ECG).  Do not drive for 24 hours if you were given a sedative during your procedure. Summary  Coronary angiogram with stent placement is a procedure to widen or open a narrowed coronary artery. This is done to treat heart problems.  Before the procedure, let your health care provider know about all the medical conditions and surgeries you have or have had.  This is a safe procedure. However, some problems may occur, including damage to nearby structures or organs, bleeding, blood clots, or allergies.  Follow your health care provider's  instructions about eating, drinking, medicines, and other lifestyle changes, such as quitting tobacco use before the procedure. This information is not intended to replace advice given to you by your health care provider. Make sure you discuss any questions you have with your health care provider. Document Revised: 10/26/2018 Document Reviewed: 10/26/2018 Elsevier Patient Education  2020 [employer]      Adopting a Healthy Lifestyle.  Know what a healthy weight is for you (roughly BMI <25) and aim to maintain this   Aim for 7+ servings of fruits and vegetables daily   65-80+   fluid ounces of water or unsweet tea for healthy kidneys   Limit to max 1 drink of alcohol per day; avoid smoking/tobacco   Limit animal fats in diet for cholesterol and heart health - choose grass fed whenever available   Avoid highly processed foods, and foods high in saturated/trans fats   Aim for low stress - take time to unwind and care for your mental health   Aim for 150 min of moderate intensity exercise weekly for heart health, and weights twice weekly for bone health   Aim for 7-9 hours of sleep daily   When it comes to diets, agreement about the perfect plan isnt easy to find, even among the experts. Experts at the [employer] developed an idea known as the Healthy Eating Plate. Just imagine a plate divided into logical, healthy portions.   The emphasis is on diet quality:   Load up on vegetables and fruits - one-half of your plate: Aim for color and variety, and remember that potatoes dont count.   Go for whole grains - one-quarter of your plate: Whole wheat, barley, wheat berries, quinoa, oats, brown rice, and foods made with them. If you want pasta, go with whole wheat pasta.   Protein power - one-quarter of your plate: Fish, chicken, beans, and nuts are all healthy, versatile protein sources. Limit red meat.   The diet, however, does go beyond the plate, offering a  few other suggestions.   Use healthy plant oils, such as olive, canola, soy, corn, sunflower and peanut. Check the labels, and avoid partially hydrogenated oil, which have unhealthy trans fats.   If youre thirsty, drink water. Coffee and tea are good in moderation, but skip sugary drinks and limit milk and dairy products to one or two daily servings.   The type of carbohydrate in the diet is more important than the amount. Some sources of carbohydrates, such as vegetables, fruits, whole grains, and beans-are healthier than others.   Finally, stay active  Signed, Suheily Birks, DO  01/31/2020 2:39 PM    [facility] 

## 2020-02-01 ENCOUNTER — Telehealth: Payer: Self-pay

## 2020-02-01 LAB — BASIC METABOLIC PANEL
BUN/Creatinine Ratio: 17 (ref 9–20)
BUN: 15 mg/dL (ref 6–24)
CO2: 27 mmol/L (ref 20–29)
Calcium: 9.4 mg/dL (ref 8.7–10.2)
Chloride: 99 mmol/L (ref 96–106)
Creatinine, Ser: 0.87 mg/dL (ref 0.76–1.27)
GFR calc Af Amer: 116 mL/min/{1.73_m2} (ref >59)
GFR calc non Af Amer: 101 mL/min/{1.73_m2} (ref >59)
Glucose: 201 mg/dL — ABNORMAL HIGH (ref 65–99)
Potassium: 4.2 mmol/L (ref 3.5–5.2)
Sodium: 138 mmol/L (ref 134–144)

## 2020-02-01 LAB — CBC
Hematocrit: 44.4 % (ref 37.5–51.0)
Hemoglobin: 14.6 g/dL (ref 13.0–17.7)
MCH: 27.5 pg (ref 26.6–33.0)
MCHC: 32.9 g/dL (ref 31.5–35.7)
MCV: 84 fL (ref 79–97)
Platelets: 239 10*3/uL (ref 150–450)
RBC: 5.3 x10E6/uL (ref 4.14–5.80)
RDW: 13 % (ref 11.6–15.4)
WBC: 5.7 10*3/uL (ref 3.4–10.8)

## 2020-02-01 NOTE — Telephone Encounter (Signed)
Left message on patients voicemail to please return our call.   

## 2020-02-01 NOTE — Telephone Encounter (Signed)
-----   Message from Kardie Tobb, DO sent at 02/01/2020 11:24 AM EDT -----  Labs normal 

## 2020-02-01 NOTE — Telephone Encounter (Signed)
[employer] is returning [employer].

## 2020-02-01 NOTE — Telephone Encounter (Signed)
Spoke with patient regarding results and recommendation.  Patient verbalizes understanding and is agreeable to plan of care. Advised patient to call back with any issues or concerns.  

## 2020-02-01 NOTE — Telephone Encounter (Signed)
Patient returning phone call 

## 2020-02-03 ENCOUNTER — Other Ambulatory Visit (HOSPITAL_COMMUNITY)
Admission: RE | Admit: 2020-02-03 | Discharge: 2020-02-03 | Disposition: A | Discharge status: Home / Self Care | Payer: 59 | Source: Ambulatory Visit | Attending: Cardiology | Admitting: Cardiology

## 2020-02-03 DIAGNOSIS — Z20822 Contact with and (suspected) exposure to covid-19: Secondary | ICD-10-CM | POA: Insufficient documentation

## 2020-02-03 DIAGNOSIS — Z01818 Encounter for other preprocedural examination: Secondary | ICD-10-CM | POA: Insufficient documentation

## 2020-02-03 LAB — SARS CORONAVIRUS 2 (TAT 6-24 HRS): SARS Coronavirus 2: NEGATIVE

## 2020-02-05 ENCOUNTER — Telehealth: Payer: Self-pay | Admitting: *Deleted

## 2020-02-05 NOTE — Telephone Encounter (Signed)
Pt contacted pre-catheterization scheduled at [facility] for: Tuesday February 06, 2020 7:30 AM Verified arrival time and place: [facility] Main Entrance A [facility]) at: 5:30 AM   No solid food after midnight prior to cath, clear liquids until 5 AM day of procedure.  Hold: Metformin-day of procedure and 48 hours post procedure Glipizide-AM of procedure  Lasix-KCl-AM of procedure  Except hold medications AM meds can be  taken pre-cath with sips of water including: ASA 81 mg   Confirmed patient has responsible adult to drive home post procedure and be with patient first 24 hours after arriving home: yes  You are allowed ONE visitor in the waiting room during the time you are at the hospital for your procedure. Both you and your visitor must wear a mask once you enter the hospital.       COVID-19 Pre-Screening Questions:   In the past 14 days have you had a new cough, new headache, new nasal congestion, fever (100.4 or greater) unexplained body aches, new sore throat, or sudden loss of taste or sense of smell? no  In the past 14 days have you been around anyone with known Covid 19? no  Have you been vaccinated for COVID-19? no   Reviewed procedure, mask,visitor instructions, COVID-19 screening with patient.

## 2020-02-06 ENCOUNTER — Other Ambulatory Visit: Payer: Self-pay

## 2020-02-06 ENCOUNTER — Ambulatory Visit (HOSPITAL_COMMUNITY)
Admission: RE | Admit: 2020-02-06 | Discharge: 2020-02-06 | Disposition: A | Discharge status: Home / Self Care | Payer: 59 | Attending: Cardiology | Admitting: Cardiology

## 2020-02-06 ENCOUNTER — Encounter (HOSPITAL_COMMUNITY)
Admission: RE | Disposition: A | Discharge status: Home / Self Care | Payer: Self-pay | Source: Home / Self Care | Attending: Cardiology

## 2020-02-06 ENCOUNTER — Encounter (HOSPITAL_COMMUNITY): Payer: Self-pay | Admitting: Cardiology

## 2020-02-06 DIAGNOSIS — I509 Heart failure, unspecified: Secondary | ICD-10-CM | POA: Diagnosis not present

## 2020-02-06 DIAGNOSIS — I200 Unstable angina: Secondary | ICD-10-CM | POA: Clinically undetermined

## 2020-02-06 DIAGNOSIS — R600 Localized edema: Secondary | ICD-10-CM | POA: Insufficient documentation

## 2020-02-06 DIAGNOSIS — E669 Obesity, unspecified: Secondary | ICD-10-CM | POA: Diagnosis not present

## 2020-02-06 DIAGNOSIS — Z87891 Personal history of nicotine dependence: Secondary | ICD-10-CM | POA: Diagnosis not present

## 2020-02-06 DIAGNOSIS — Z794 Long term (current) use of insulin: Secondary | ICD-10-CM | POA: Diagnosis not present

## 2020-02-06 DIAGNOSIS — I110 Hypertensive heart disease with heart failure: Secondary | ICD-10-CM | POA: Insufficient documentation

## 2020-02-06 DIAGNOSIS — M109 Gout, unspecified: Secondary | ICD-10-CM | POA: Insufficient documentation

## 2020-02-06 DIAGNOSIS — E7800 Pure hypercholesterolemia, unspecified: Secondary | ICD-10-CM | POA: Diagnosis not present

## 2020-02-06 DIAGNOSIS — Z79899 Other long term (current) drug therapy: Secondary | ICD-10-CM | POA: Insufficient documentation

## 2020-02-06 DIAGNOSIS — R0602 Shortness of breath: Secondary | ICD-10-CM

## 2020-02-06 DIAGNOSIS — E1165 Type 2 diabetes mellitus with hyperglycemia: Secondary | ICD-10-CM | POA: Diagnosis not present

## 2020-02-06 DIAGNOSIS — R5383 Other fatigue: Secondary | ICD-10-CM | POA: Diagnosis not present

## 2020-02-06 DIAGNOSIS — R0600 Dyspnea, unspecified: Secondary | ICD-10-CM | POA: Diagnosis not present

## 2020-02-06 DIAGNOSIS — Z6834 Body mass index (BMI) 34.0-34.9, adult: Secondary | ICD-10-CM | POA: Insufficient documentation

## 2020-02-06 DIAGNOSIS — R55 Syncope and collapse: Secondary | ICD-10-CM | POA: Diagnosis present

## 2020-02-06 DIAGNOSIS — I10 Essential (primary) hypertension: Secondary | ICD-10-CM | POA: Diagnosis present

## 2020-02-06 DIAGNOSIS — E119 Type 2 diabetes mellitus without complications: Secondary | ICD-10-CM

## 2020-02-06 HISTORY — PX: RIGHT/LEFT HEART CATH AND CORONARY ANGIOGRAPHY: CATH118266

## 2020-02-06 HISTORY — DX: Unstable angina: I20.0

## 2020-02-06 HISTORY — DX: Shortness of breath: R06.02

## 2020-02-06 LAB — POCT I-STAT EG7
Acid-Base Excess: 0 mmol/L (ref 0.0–2.0)
Bicarbonate: 26.5 mmol/L (ref 20.0–28.0)
Calcium, Ion: 1.22 mmol/L (ref 1.15–1.40)
HCT: 40 % (ref 39.0–52.0)
Hemoglobin: 13.6 g/dL (ref 13.0–17.0)
O2 Saturation: 76 %
Potassium: 3.9 mmol/L (ref 3.5–5.1)
Sodium: 141 mmol/L (ref 135–145)
TCO2: 28 mmol/L (ref 22–32)
pCO2, Ven: 48.4 mmHg (ref 44.0–60.0)
pH, Ven: 7.346 (ref 7.250–7.430)
pO2, Ven: 44 mmHg (ref 32.0–45.0)

## 2020-02-06 LAB — GLUCOSE, CAPILLARY: Glucose-Capillary: 103 mg/dL — ABNORMAL HIGH (ref 70–99)

## 2020-02-06 SURGERY — RIGHT/LEFT HEART CATH AND CORONARY ANGIOGRAPHY
Anesthesia: LOCAL

## 2020-02-06 MED ORDER — FENTANYL CITRATE (PF) 100 MCG/2ML IJ SOLN
INTRAMUSCULAR | Status: AC
  Filled 2020-02-06: qty 2

## 2020-02-06 MED ORDER — SODIUM CHLORIDE 0.9 % WEIGHT BASED INFUSION
3.0000 mL/kg/h | INTRAVENOUS | Status: AC
  Administered 2020-02-06: 3 mL/kg/h via INTRAVENOUS

## 2020-02-06 MED ORDER — SODIUM CHLORIDE 0.9% FLUSH: 3.0000 mL | INTRAVENOUS | Status: DC | PRN

## 2020-02-06 MED ORDER — HEPARIN SODIUM (PORCINE) 1000 UNIT/ML IJ SOLN
INTRAMUSCULAR | Status: AC
  Filled 2020-02-06: qty 1

## 2020-02-06 MED ORDER — SODIUM CHLORIDE 0.9 % WEIGHT BASED INFUSION: 1.0000 mL/kg/h | INTRAVENOUS | Status: DC

## 2020-02-06 MED ORDER — SODIUM CHLORIDE 0.9 % IV SOLN: INTRAVENOUS | Status: AC

## 2020-02-06 MED ORDER — MIDAZOLAM HCL 2 MG/2ML IJ SOLN
INTRAMUSCULAR | Status: AC
  Filled 2020-02-06: qty 2

## 2020-02-06 MED ORDER — VERAPAMIL HCL 2.5 MG/ML IV SOLN
INTRAVENOUS | Status: AC
  Filled 2020-02-06: qty 2

## 2020-02-06 MED ORDER — VERAPAMIL HCL 2.5 MG/ML IV SOLN
INTRAVENOUS | Status: DC | PRN
  Administered 2020-02-06: 10 mL via INTRA_ARTERIAL

## 2020-02-06 MED ORDER — ACETAMINOPHEN 325 MG PO TABS: 650.0000 mg | ORAL_TABLET | ORAL | Status: DC | PRN

## 2020-02-06 MED ORDER — HYDRALAZINE HCL 20 MG/ML IJ SOLN: 10.0000 mg | INTRAMUSCULAR | Status: DC | PRN

## 2020-02-06 MED ORDER — LIDOCAINE HCL (PF) 1 % IJ SOLN
INTRAMUSCULAR | Status: AC
  Filled 2020-02-06: qty 30

## 2020-02-06 MED ORDER — SODIUM CHLORIDE 0.9% FLUSH: 3.0000 mL | Freq: Two times a day (BID) | INTRAVENOUS | Status: DC

## 2020-02-06 MED ORDER — LABETALOL HCL 5 MG/ML IV SOLN: 10.0000 mg | INTRAVENOUS | Status: DC | PRN

## 2020-02-06 MED ORDER — HEPARIN SODIUM (PORCINE) 1000 UNIT/ML IJ SOLN
INTRAMUSCULAR | Status: DC | PRN
  Administered 2020-02-06: 6000 [IU] via INTRAVENOUS

## 2020-02-06 MED ORDER — SODIUM CHLORIDE 0.9 % IV SOLN: 250.0000 mL | INTRAVENOUS | Status: DC | PRN

## 2020-02-06 MED ORDER — ASPIRIN 81 MG PO CHEW
CHEWABLE_TABLET | ORAL | Status: AC
  Administered 2020-02-06: 81 mg
  Filled 2020-02-06: qty 1

## 2020-02-06 MED ORDER — FENTANYL CITRATE (PF) 100 MCG/2ML IJ SOLN
INTRAMUSCULAR | Status: DC | PRN
Start: 2020-02-06 — End: 2020-02-06
  Administered 2020-02-06 (×2): 25 ug via INTRAVENOUS

## 2020-02-06 MED ORDER — LIDOCAINE HCL (PF) 1 % IJ SOLN
INTRAMUSCULAR | Status: DC | PRN
  Administered 2020-02-06: 5 mL

## 2020-02-06 MED ORDER — IOHEXOL 350 MG/ML SOLN
INTRAVENOUS | Status: DC | PRN
  Administered 2020-02-06: 70 mL via INTRA_ARTERIAL

## 2020-02-06 MED ORDER — ASPIRIN 81 MG PO CHEW: 81.0000 mg | CHEWABLE_TABLET | ORAL | Status: DC

## 2020-02-06 MED ORDER — MIDAZOLAM HCL 2 MG/2ML IJ SOLN
INTRAMUSCULAR | Status: DC | PRN
  Administered 2020-02-06 (×2): 1 mg via INTRAVENOUS

## 2020-02-06 MED ORDER — HEPARIN (PORCINE) IN NACL 1000-0.9 UT/500ML-% IV SOLN
INTRAVENOUS | Status: DC | PRN
  Administered 2020-02-06 (×2): 500 mL

## 2020-02-06 MED ORDER — HEPARIN (PORCINE) IN NACL 1000-0.9 UT/500ML-% IV SOLN
INTRAVENOUS | Status: AC
  Filled 2020-02-06: qty 1000

## 2020-02-06 MED ORDER — ONDANSETRON HCL 4 MG/2ML IJ SOLN: 4.0000 mg | Freq: Four times a day (QID) | INTRAMUSCULAR | Status: DC | PRN

## 2020-02-06 SURGICAL SUPPLY — 15 items
CATH INFINITI 5 FR JL3.5 (CATHETERS) ×1 IMPLANT
CATH INFINITI JR4 5F (CATHETERS) ×1 IMPLANT
CATH OPTITORQUE TIG 4.0 5F (CATHETERS) ×1 IMPLANT
CATH SWAN GANZ 7F STRAIGHT (CATHETERS) ×1 IMPLANT
DEVICE RAD COMP TR BAND LRG (VASCULAR PRODUCTS) ×1 IMPLANT
GLIDESHEATH SLEND SS 6F .021 (SHEATH) ×1 IMPLANT
GLIDESHEATH SLENDER 7FR .021G (SHEATH) ×1 IMPLANT
GUIDEWIRE INQWIRE 1.5J.035X260 (WIRE) IMPLANT
INQWIRE 1.5J .035X260CM (WIRE) ×2
KIT HEART LEFT (KITS) ×2 IMPLANT
PACK CARDIAC CATHETERIZATION (CUSTOM PROCEDURE TRAY) ×2 IMPLANT
SHEATH PROBE COVER 6X72 (BAG) ×1 IMPLANT
SYR MEDRAD MARK 7 150ML (SYRINGE) ×2 IMPLANT
TRANSDUCER W/STOPCOCK (MISCELLANEOUS) ×2 IMPLANT
TUBING CIL FLEX 10 FLL-RA (TUBING) ×2 IMPLANT

## 2020-02-06 NOTE — Interval H&P Note (Signed)
History and Physical Interval Note:  02/06/2020 7:38 AM  [name]  has presented today for surgery, with the diagnosis of angina & edema. The various methods of treatment have been discussed with the patient and family. After consideration of risks, benefits and other options for treatment, the patient has consented to  Procedure(s): RIGHT/LEFT HEART CATH AND CORONARY ANGIOGRAPHY (N/A)  PERCUTANEOUS CORONARY INTERVENTION  as a surgical intervention.  The patient's history has been reviewed, patient examined, no change in status, stable for surgery.  I have reviewed the patient's chart and labs.  Questions were answered to the patient's satisfaction.    Cath Lab Visit (complete for each Cath Lab visit)  Clinical Evaluation Leading to the Procedure:   The patient has been referred for a diagnostic procedure the results of which may change these findings.  ACS: No.  Non-ACS:    Anginal Classification: CCS III noted as dyspnea on exertion  Anti-ischemic medical therapy: No Therapy  Non-Invasive Test Results: No non-invasive testing performed; essentially normal echocardiogram  Prior CABG: No previous CABG    Bryan Lemma

## 2020-02-06 NOTE — Brief Op Note (Addendum)
Brief Right Left Heart Cath Note ° ° °02/06/2020 °8:42 AM  ° °SURGEON:  Surgeon(s) and Role: °   * Harding, David W, MD - Primary ° °PATIENT:  Victor [name]  49 y.o. male with longstanding lower extremity edema seen by Dr. Tobb for evaluation of exertional dyspnea with intermittent chest pain.  Normal echocardiogram.  Referred for right left heart catheterization for definitive evaluation. ° °PRE-OPERATIVE DIAGNOSIS:  angina, dyspnea exertion ° °POST-OPERATIVE DIAGNOSIS:  °· Angiographic normal coronary arteries with dominant Circumflex (LCx).   °· Cloacal Left Main. °· Normal Left and Right Heart Pressures-LVEDP 12 mmHg, PCWP 9 mmHg. °· Cardiac Output & Index: Fick 6.41, 2.79; thermal 5.08, 2.21 ° °PROCEDURE:  Procedure(s): °RIGHT/LEFT HEART CATH AND CORONARY ANGIOGRAPHY (N/A) °Time Out: Verified patient identification, verified procedure, site/side was marked, verified correct patient position, special equipment/implants available, medications/allergies/relevent history reviewed, required imaging and test results available. Performed. ° °Access:  °• RIGHT Radial Artery: 6 Fr sheath -- Seldinger technique using Micropuncture Kit °• Direct ultrasound guidance used.  Permanent image obtained and placed on chart. °• 10 mL radial cocktail IA; 6000 Units IV Heparin °• * Right Brachial/Antecubital Vein: The existing 18-gauge IV was exchanged over a wire for a 7Fr  sheath ° °Right Heart Catheterization: 7 Fr Swan Ganz catheter advanced under fluoroscopy with balloon inflated to the RA, RV, then PCWP-PA for hemodynamic measurement.  °• Simultaneous FA & PA blood gases checked for SaO2% to calculate FICK CO/CI  °• Thermodilution Injections performed to calculate CO/CI  °• Catheter removed completely out of the body with balloon deflated. ° °• Left Heart Catheterization: 5Fr Catheters advanced or exchanged over a J-wire under direct fluoroscopic guidance into the ascending aorta; TIG 4.0 catheter advanced first.  °• * LV  Hemodynamics (LV Gram): TIG 4.0 °• * Left Coronary Artery Cineangiography: JL3.5 Catheter (very high takeoff, and short aortic root) °• * Right Coronary Artery Cineangiography: JR4 Catheter  ° °Upon completion of Angiogaphy, the catheter was removed completely out of the body over a wire, without complication. ° °Brachial Sheath(s) removed in the Cath Lab with manual pressure for hemostasis.  °  °Radial sheath removed in the Cardiac Catheterization lab with TR Band placed for hemostasis. ° °TR Band: 0845 Hours; 14 mL air ° °MEDICATIONS °* SQ Lidocaine 3mL °* Radial Cocktail: 3 mg Verapmil in 10 mL NS °* Isovue Contrast: 70 mL °* Heparin: 6000 units ° °ANESTHESIA:   local and IV sedation; 3 mL radial, 1 mL brachial lidocaine; 2 mg IV Versed, 50 mg IV fentanyl ° °EBL:  <20 Ml  ° °COUNTS:  YES ° °DICTATION: .Note written in EPIC ° °PLAN OF CARE: Discharge to home after PACU  °· Look for noncardiac etiology for chest pain and dyspnea. ° °PATIENT DISPOSITION:  PACU - hemodynamically stable. °  °Delay start of Pharmacological VTE agent (>24hrs) due to surgical blood loss or risk of bleeding: not applicable ° ° °David Harding, M.D., M.S. °Interventional Cardiologist  ° °Pager # [phone] °Phone # [phone] °[address] °[place], [place] [address] ° ° °

## 2020-02-06 NOTE — Discharge Instructions (Signed)
DRINK PLENTY OF FLUIDS FOR THE NEXT 2-3 DAYS. ° °KEEP ARM ELEVATED THE REMAINDER OF THE DAY. ° °HOLD METFORMIN FOR A FULL 48 HOURS AFTER DISCHARGE. ° ° °Radial Site Care ° °This sheet gives you information about how to care for yourself after your procedure. Your health care provider may also give you more specific instructions. If you have problems or questions, contact your health care provider. °What can I expect after the procedure? °After the procedure, it is common to have: °· Bruising and tenderness at the catheter insertion area. °Follow these instructions at home: °Medicines °· Take over-the-counter and prescription medicines only as told by your health care provider. °Insertion site care °1. Follow instructions from your health care provider about how to take care of your insertion site. Make sure you: °? Wash your hands with soap and water before you change your bandage (dressing). If soap and water are not available, use hand sanitizer. °? Change your dressing as told by your health care provider. °2. Check your insertion site every day for signs of infection. Check for: °? Redness, swelling, or pain. °? Fluid or blood. °? Pus or a bad smell. °? Warmth. °3. Do not take baths, swim, or use a hot tub for 5 days. °4. You may shower 24-48 hours after the procedure. °? Remove the dressing and gently wash the site with plain soap and water. °? Pat the area dry with a clean towel. °? Do not rub the site. That could cause bleeding. °5. Do not apply powder or lotion to the site. °Activity ° °1. For 24 hours after the procedure, or as directed by your health care provider: °? Do not flex or bend the affected arm. °? Do not push or pull heavy objects with the affected arm. °? Do not drive yourself home from the hospital or clinic. You may drive 24 hours after the procedure. °? Do not operate machinery or power tools. °2. Do not push, pull or lift anything that is heavier than 10 lb for 5 days. °3. Ask your health  care provider when it is okay to: °? Return to work or school. °? Resume usual physical activities or sports. °? Resume sexual activity. °General instructions °· If the catheter site starts to bleed, raise your arm and put firm pressure on the site. If the bleeding does not stop, get help right away. This is a medical emergency. °· If you went home on the same day as your procedure, a responsible adult should be with you for the first 24 hours after you arrive home. °· Keep all follow-up visits as told by your health care provider. This is important. °Contact a health care provider if: °· You have a fever. °· You have redness, swelling, or yellow drainage around your insertion site. °Get help right away if: °· You have unusual pain at the radial site. °· The catheter insertion area swells very fast. °· The insertion area is bleeding, and the bleeding does not stop when you hold steady pressure on the area. °· Your arm or hand becomes pale, cool, tingly, or numb. °These symptoms may represent a serious problem that is an emergency. Do not wait to see if the symptoms will go away. Get medical help right away. Call your local emergency services (911 in the [place] Do not drive yourself to the hospital. °Summary °· After the procedure, it is common to have bruising and tenderness at the site. °· Follow instructions from your health care provider   about how to take care of your radial site wound. Check the wound every day for signs of infection. °· Do not push, pull or lift anything that is heavier than 10 lb for 5 days. ° °This information is not intended to replace advice given to you by your health care provider. Make sure you discuss any questions you have with your health care provider. °Document Revised: 05/12/2017 Document Reviewed: 05/12/2017 °Elsevier Patient Education © 2020 [employer] °

## 2020-02-07 LAB — POCT I-STAT 7, (LYTES, BLD GAS, ICA,H+H)
Acid-Base Excess: 0 mmol/L (ref 0.0–2.0)
Bicarbonate: 25.6 mmol/L (ref 20.0–28.0)
Calcium, Ion: 1.26 mmol/L (ref 1.15–1.40)
HCT: 41 % (ref 39.0–52.0)
Hemoglobin: 13.9 g/dL (ref 13.0–17.0)
O2 Saturation: 100 %
Potassium: 4 mmol/L (ref 3.5–5.1)
Sodium: 140 mmol/L (ref 135–145)
TCO2: 27 mmol/L (ref 22–32)
pCO2 arterial: 42.9 mmHg (ref 32.0–48.0)
pH, Arterial: 7.383 (ref 7.350–7.450)
pO2, Arterial: 176 mmHg — ABNORMAL HIGH (ref 83.0–108.0)

## 2020-02-07 LAB — POCT I-STAT EG7
Acid-Base Excess: 0 mmol/L (ref 0.0–2.0)
Bicarbonate: 25.8 mmol/L (ref 20.0–28.0)
Calcium, Ion: 1.19 mmol/L (ref 1.15–1.40)
HCT: 40 % (ref 39.0–52.0)
Hemoglobin: 13.6 g/dL (ref 13.0–17.0)
O2 Saturation: 76 %
Potassium: 3.8 mmol/L (ref 3.5–5.1)
Sodium: 141 mmol/L (ref 135–145)
TCO2: 27 mmol/L (ref 22–32)
pCO2, Ven: 46.9 mmHg (ref 44.0–60.0)
pH, Ven: 7.348 (ref 7.250–7.430)
pO2, Ven: 43 mmHg (ref 32.0–45.0)

## 2020-02-19 ENCOUNTER — Other Ambulatory Visit: Payer: Self-pay | Admitting: Family Medicine

## 2020-02-19 ENCOUNTER — Encounter: Payer: Self-pay | Admitting: Cardiology

## 2020-02-19 ENCOUNTER — Other Ambulatory Visit: Payer: Self-pay

## 2020-02-19 ENCOUNTER — Ambulatory Visit (INDEPENDENT_AMBULATORY_CARE_PROVIDER_SITE_OTHER): Payer: 59 | Admitting: Cardiology

## 2020-02-19 VITALS — BP 110/84 | HR 78 | Ht 71.0 in | Wt 262.0 lb

## 2020-02-19 DIAGNOSIS — R600 Localized edema: Secondary | ICD-10-CM

## 2020-02-19 DIAGNOSIS — E119 Type 2 diabetes mellitus without complications: Secondary | ICD-10-CM

## 2020-02-19 DIAGNOSIS — I471 Supraventricular tachycardia: Secondary | ICD-10-CM | POA: Diagnosis not present

## 2020-02-19 DIAGNOSIS — I4729 Other ventricular tachycardia: Secondary | ICD-10-CM | POA: Insufficient documentation

## 2020-02-19 DIAGNOSIS — I472 Ventricular tachycardia: Secondary | ICD-10-CM

## 2020-02-19 DIAGNOSIS — I10 Essential (primary) hypertension: Secondary | ICD-10-CM

## 2020-02-19 DIAGNOSIS — E669 Obesity, unspecified: Secondary | ICD-10-CM

## 2020-02-19 MED ORDER — METOPROLOL SUCCINATE ER 25 MG PO TB24: 12.5000 mg | ORAL_TABLET | Freq: Every day | ORAL | 1 refills | Status: DC

## 2020-02-19 NOTE — Patient Instructions (Signed)
Medication Instructions:  Your physician has recommended you make the following change in your medication:   STOP: Lipitor   START: Metoprolol succinate 12.5 mg daily   *If you need a refill on your cardiac medications before your next appointment, please call your pharmacy*   Lab Work: None If you have labs (blood work) drawn today and your tests are completely normal, you will receive your results only by: Marland Kitchen MyChart Message (if you have MyChart) OR . A paper copy in the mail If you have any lab test that is abnormal or we need to change your treatment, we will call you to review the results.   Testing/Procedures: None   Follow-Up: At [facility], you and your health needs are our priority.  As part of our continuing mission to provide you with exceptional heart care, we have created designated Provider Care Teams.  These Care Teams include your primary Cardiologist (physician) and Advanced Practice Providers (APPs -  Physician Assistants and Nurse Practitioners) who all work together to provide you with the care you need, when you need it.  We recommend signing up for the patient portal called "MyChart".  Sign up information is provided on this After Visit Summary.  MyChart is used to connect with patients for Virtual Visits (Telemedicine).  Patients are able to view lab/test results, encounter notes, upcoming appointments, etc.  Non-urgent messages can be sent to your provider as well.   To learn more about what you can do with MyChart, go to [email or web address].    Your next appointment:   1 month(s)  The format for your next appointment:   In Person  Provider:   Thomasene Ripple, DO   Other Instructions

## 2020-02-19 NOTE — Progress Notes (Signed)
Cardiology Office Note:    Date:  02/19/2020   ID:  [name], DOB 04-Jan-1971, MRN [id number]  PCP:  Nechama Guard, FNP  Cardiologist:  Thomasene Ripple, DO  Electrophysiologist:  None   Referring MD: Nechama Guard, FNP   " my leg is still swelling doc"  History of Present Illness:    Victor [name] is a 49 y.o. male with a hx of  of chronic diastolic failure, significant bilateral leg edema, hypertension, diabetes mellitus, recurrent syncope and obesity presented for initial evaluation on January 31, 2020 at that time giving the nature of his history based on his symptoms significance send the patient for a right and left heart catheterization as well as placed a monitor and patient as he had had some syncope episodes.  In the interim he was able to get his heart catheterization and also wore a ZIO monitor.  He is here today to discuss the results.  The patient tells me that he still is expressing significant leg swelling which is getting worse and shortness of breath.  Past Medical History:  Diagnosis Date  . Acute on chronic congestive heart failure (HCC)   . Arthritis   . Gout   . Hay fever   . High cholesterol   . Hypertension   . Mental health problem   . Near syncope 12/27/2019  . Pre-syncope 12/27/2019  . Shortness of breath 02/06/2020  . Syncope 12/26/2019  . Type 2 diabetes mellitus without complication, without long-term current use of insulin (HCC)   . Unstable angina (HCC) 02/06/2020    Past Surgical History:  Procedure Laterality Date  . EYE SURGERY    . HEMORROIDECTOMY    . RIGHT/LEFT HEART CATH AND CORONARY ANGIOGRAPHY N/A 02/06/2020   Procedure: RIGHT/LEFT HEART CATH AND CORONARY ANGIOGRAPHY;  Surgeon: Marykay Lex, MD;  Location: [facility] INVASIVE CV LAB;  Service: Cardiovascular;  Laterality: N/A;  . WISDOM TOOTH EXTRACTION      Current Medications: Current Meds  Medication Sig  . acetaminophen (TYLENOL) 325 MG tablet Take 2 tablets (650 mg total) by  mouth every 6 (six) hours as needed for mild pain (or Fever >/= 101).  Marland Kitchen atorvastatin (LIPITOR) 10 MG tablet Take 10 mg by mouth at bedtime.  . furosemide (LASIX) 20 MG tablet Take 20 mg by mouth daily. Take with 40 mg for a total of 60 mg  . furosemide (LASIX) 40 MG tablet Take 40 mg by mouth daily. Take with 20 mg for a total of 60 mg  . glipiZIDE (GLUCOTROL) 5 MG tablet Take 5 mg by mouth daily.  Marland Kitchen losartan (COZAAR) 25 MG tablet Take 25 mg by mouth daily.  . metFORMIN (GLUCOPHAGE) 1000 MG tablet Take 1,000 mg by mouth 2 (two) times daily with a meal.   . potassium chloride SA (KLOR-CON) 20 MEQ tablet Take 20 mEq by mouth daily.  . rosuvastatin (CRESTOR) 10 MG tablet Take 10 mg by mouth daily.  . sertraline (ZOLOFT) 50 MG tablet Take 50 mg by mouth daily.  . TRULICITY 4.5 MG/0.5ML SOPN Inject 4.5 mg into the skin once a week. Friday     Allergies:   Shellfish-derived products and Coconut oil   Social History   Socioeconomic History  . Marital status: Married    Spouse name: Not on file  . Number of children: Not on file  . Years of education: Not on file  . Highest education level: Not on file  Occupational History  . Not on  file  Tobacco Use  . Smoking status: Former Smoker    Quit date: 04/20/2000    Years since quitting: 19.8  . Smokeless tobacco: Never Used  Vaping Use  . Vaping Use: Never used  Substance and Sexual Activity  . Alcohol use: Never  . Drug use: Never  . Sexual activity: Not on file  Other Topics Concern  . Not on file  Social History Narrative   Works 3rd shift    Social Determinants of Corporate investment banker Strain:   . Difficulty of Paying Living Expenses: Not on file  Food Insecurity:   . Worried About Programme researcher, broadcasting/film/video in the Last Year: Not on file  . Ran Out of Food in the Last Year: Not on file  Transportation Needs:   . Lack of Transportation (Medical): Not on file  . Lack of Transportation (Non-Medical): Not on file  Physical  Activity:   . Days of Exercise per Week: Not on file  . Minutes of Exercise per Session: Not on file  Stress:   . Feeling of Stress : Not on file  Social Connections:   . Frequency of Communication with Friends and Family: Not on file  . Frequency of Social Gatherings with Friends and Family: Not on file  . Attends Religious Services: Not on file  . Active Member of Clubs or Organizations: Not on file  . Attends Banker Meetings: Not on file  . Marital Status: Not on file     Family History: The patient's family history includes Diabetes in his mother; Hypertension in his brother, father, maternal grandfather, and paternal grandmother.  ROS:   Review of Systems  Constitution: Negative for decreased appetite, fever and weight gain.  HENT: Negative for congestion, ear discharge, hoarse voice and sore throat.   Eyes: Negative for discharge, redness, vision loss in right eye and visual halos.  Cardiovascular: Negative for chest pain, dyspnea on exertion, leg swelling, orthopnea and palpitations.  Respiratory: Negative for cough, hemoptysis, shortness of breath and snoring.   Endocrine: Negative for heat intolerance and polyphagia.  Hematologic/Lymphatic: Negative for bleeding problem. Does not bruise/bleed easily.  Skin: Negative for flushing, nail changes, rash and suspicious lesions.  Musculoskeletal: Negative for arthritis, joint pain, muscle cramps, myalgias, neck pain and stiffness.  Gastrointestinal: Negative for abdominal pain, bowel incontinence, diarrhea and excessive appetite.  Genitourinary: Negative for decreased libido, genital sores and incomplete emptying.  Neurological: Negative for brief paralysis, focal weakness, headaches and loss of balance.  Psychiatric/Behavioral: Negative for altered mental status, depression and suicidal ideas.  Allergic/Immunologic: Negative for HIV exposure and persistent infections.    EKGs/Labs/Other Studies Reviewed:    The  following studies were reviewed today:   EKG:  The ekg ordered today demonstrates   Zio monitor: The patient wore the monitor for 6 days 10 hours starting January 31, 2020   . Indication: Syncope The minimum heart rate was 54 bpm, maximum heart rate was 231 bpm, and average heart rate was 83 bpm. Predominant underlying rhythm was Sinus Rhythm.  2 Ventricular Tachycardia runs occurred, the run with the fastest interval lasting 5 beats with a maximum rate of 231 bpm (average 146 bpm); the run with the fastest interval was also the longest.   1 run of Supraventricular Tachycardia occurred lasting 4 beats with a maximal rate of 130 bpm (average 122 bpm).  Premature atrial complexes were rare less than 1%. Premature Ventricular complexes were rare less than 1%.  No  pauses, No AV block and no atrial fibrillation present. No patient triggered events were noted.   Conclusion: This study is remarkable for the following:                             1.  2 episodes of nonsustained ventricular tachycardia.                             2.  One episode paroxysmal supraventricular tachycardia.    Right and left heart catheterization   The left ventricular systolic function is normal. The left ventricular ejection fraction is 55-65% by visual estimate.  LV end diastolic pressure is normal.  Right Heart Cath pressures are normal. Low normal cardiac output-index.  --------------------------  Angiographically normal coronary arteries with Left dominant system and cloacal Left Main   SUMMARY  Angiographic normal coronary arteries with dominant Circumflex (LCx).    Cloacal Left Main.  Normal Left and Right Heart Pressures-LVEDP 12 mmHg, PCWP 9 mmHg.  Cardiac Output & Index: Fick 6.41, 2.79; thermal 5.08, 2.21  TTE IMPRESSIONS  1. Left ventricular ejection fraction, by estimation, is 60 to 65%. The left ventricle has normal function. The left ventricle has no regional    ricular  diastolic parameters were  normal.  2. Right ventricular systolic function is normal. The right ventricular  size is normal. There is normal pulmonary artery systolic pressure.  3. Cannot r/o small PFO by color.  4. The mitral valve is normal in structure. Trivial mitral valve  regurgitation. No evidence of mitral stenosis.  5. The aortic valve is normal in structure. Aortic valve regurgitation is  not visualized. Mild to moderate aortic valve sclerosis/calcification is  present, without any evidence of aortic stenosis.  6. The inferior vena cava is normal in size with greater than 50%  respiratory variability, suggesting right atrial pressure of 3 mmHg.   Recent Labs: 12/27/2019: B Natriuretic Peptide 173.8; Magnesium 1.8; TSH 1.969 12/28/2019: ALT 30 01/31/2020: BUN 15; Creatinine, Ser 0.87; Platelets 239 02/06/2020: Hemoglobin 13.6; Potassium 3.8; Sodium 141  Recent Lipid Panel    Component Value Date/Time   CHOL 168 12/27/2019 1228   TRIG 185 (H) 12/27/2019 1228   HDL 32 (L) 12/27/2019 1228   CHOLHDL 5.3 12/27/2019 1228   VLDL 37 12/27/2019 1228   LDLCALC 99 12/27/2019 1228    Physical Exam:    VS:  BP 110/84 (BP Location: Right Arm, Patient Position: Sitting, Cuff Size: Normal)   Pulse 78   Ht 5\' 11"  (1.803 m)   Wt 262 lb (118.8 kg)   SpO2 98%   BMI 36.54 kg/m     Wt Readings from Last 3 Encounters:  02/19/20 262 lb (118.8 kg)  02/06/20 245 lb (111.1 kg)  01/31/20 249 lb (112.9 kg)     GEN: Well nourished, well developed in no acute distress HEENT: Normal NECK: No JVD; No carotid bruits LYMPHATICS: No lymphadenopathy CARDIAC: S1S2 noted,RRR, no murmurs, rubs, gallops RESPIRATORY:  Clear to auscultation without rales, wheezing or rhonchi  ABDOMEN: Soft, non-tender, non-distended, +bowel sounds, no guarding. EXTREMITIES: Bilateral up to knee edema, No cyanosis, no clubbing MUSCULOSKELETAL:  No deformity  SKIN: Warm and dry NEUROLOGIC:  Alert and oriented x  3, non-focal PSYCHIATRIC:  Normal affect, good insight  ASSESSMENT:    1. NSVT (nonsustained ventricular tachycardia) (HCC)   2. Primary hypertension   3. Type 2 diabetes  mellitus without complication, without long-term current use of insulin (HCC)   4. PSVT (paroxysmal supraventricular tachycardia) (HCC)   5. Obesity (BMI 30-39.9)    PLAN:     1.  We discussed his testing results.  I showed the patient that he did have some episodes of NSVT and his heart rate went up to the maximum of 231 beats per minute.  I will start the patient on low-dose beta-blocker for this.  Toprol-XL 12.5 mg p.o.  2 we also talked about his recent left heart catheterization answer his questions.  He still has significant bilateral leg edema which look worse than when I saw him recently.  I think the patient should see nephrology to be evaluated for nephrotic syndrome as well.  3.  His blood pressure at target in the office. 4.  Diabetes per his PCP.  The patient is in agreement with the above plan. The patient left the office in stable condition.  The patient will follow up in 1 month due to new medication.   Medication Adjustments/Labs and Tests Ordered: Current medicines are reviewed at length with the patient today.  Concerns regarding medicines are outlined above.  No orders of the defined types were placed in this encounter.  No orders of the defined types were placed in this encounter.   There are no Patient Instructions on file for this visit.   Adopting a Healthy Lifestyle.  Know what a healthy weight is for you (roughly BMI <25) and aim to maintain this   Aim for 7+ servings of fruits and vegetables daily   65-80+ fluid ounces of water or unsweet tea for healthy kidneys   Limit to max 1 drink of alcohol per day; avoid smoking/tobacco   Limit animal fats in diet for cholesterol and heart health - choose grass fed whenever available   Avoid highly processed foods, and foods high in  saturated/trans fats   Aim for low stress - take time to unwind and care for your mental health   Aim for 150 min of moderate intensity exercise weekly for heart health, and weights twice weekly for bone health   Aim for 7-9 hours of sleep daily   When it comes to diets, agreement about the perfect plan isnt easy to find, even among the experts. Experts at the [facility] of [employer] developed an idea known as the Healthy Eating Plate. Just imagine a plate divided into logical, healthy portions.   The emphasis is on diet quality:   Load up on vegetables and fruits - one-half of your plate: Aim for color and variety, and remember that potatoes dont count.   Go for whole grains - one-quarter of your plate: Whole wheat, barley, wheat berries, quinoa, oats, brown rice, and foods made with them. If you want pasta, go with whole wheat pasta.   Protein power - one-quarter of your plate: Fish, chicken, beans, and nuts are all healthy, versatile protein sources. Limit red meat.   The diet, however, does go beyond the plate, offering a few other suggestions.   Use healthy plant oils, such as olive, canola, soy, corn, sunflower and peanut. Check the labels, and avoid partially hydrogenated oil, which have unhealthy trans fats.   If youre thirsty, drink water. Coffee and tea are good in moderation, but skip sugary drinks and limit milk and dairy products to one or two daily servings.   The type of carbohydrate in the diet is more important than the amount. Some sources  of carbohydrates, such as vegetables, fruits, whole grains, and beans-are healthier than others.   Finally, stay active  Signed, Thomasene Ripple, DO  02/19/2020 9:49 AM    [facility]

## 2020-02-28 ENCOUNTER — Other Ambulatory Visit: Payer: Self-pay

## 2020-02-28 DIAGNOSIS — I890 Lymphedema, not elsewhere classified: Secondary | ICD-10-CM

## 2020-02-28 DIAGNOSIS — I872 Venous insufficiency (chronic) (peripheral): Secondary | ICD-10-CM

## 2020-02-29 ENCOUNTER — Encounter: Payer: Self-pay | Admitting: Endocrinology

## 2020-02-29 ENCOUNTER — Ambulatory Visit (INDEPENDENT_AMBULATORY_CARE_PROVIDER_SITE_OTHER): Payer: 59 | Admitting: Endocrinology

## 2020-02-29 ENCOUNTER — Other Ambulatory Visit: Payer: Self-pay

## 2020-02-29 VITALS — BP 132/64 | HR 82 | Ht 71.0 in | Wt 262.0 lb

## 2020-02-29 DIAGNOSIS — E119 Type 2 diabetes mellitus without complications: Secondary | ICD-10-CM

## 2020-02-29 DIAGNOSIS — E1369 Other specified diabetes mellitus with other specified complication: Secondary | ICD-10-CM

## 2020-02-29 DIAGNOSIS — E1165 Type 2 diabetes mellitus with hyperglycemia: Secondary | ICD-10-CM | POA: Diagnosis not present

## 2020-02-29 LAB — POCT GLYCOSYLATED HEMOGLOBIN (HGB A1C): Hemoglobin A1C: 8.2 % — AB (ref 4.0–5.6)

## 2020-02-29 MED ORDER — CANAGLIFLOZIN 300 MG PO TABS: 300.0000 mg | ORAL_TABLET | Freq: Every day | ORAL | 11 refills | Status: DC

## 2020-02-29 NOTE — Progress Notes (Signed)
Subjective:    Patient ID: Victor [name], male    DOB: 01/05/1971, 49 y.o.   MRN: [id number]  HPI pt is referred by Dr Servando Salina, for diabetes.  Pt states DM was dx'ed in 2012; he is unaware of any chronic complications; he never been on insulin; pt says his diet and exercise are not good; he has never had pancreatitis, pancreatic surgery, severe hypoglycemia or DKA.  He says cbg's are in the 100's.  Past Medical History:  Diagnosis Date  . Acute on chronic congestive heart failure (HCC)   . Arthritis   . Gout   . Hay fever   . High cholesterol   . Hypertension   . Mental health problem   . Near syncope 12/27/2019  . Pre-syncope 12/27/2019  . Shortness of breath 02/06/2020  . Syncope 12/26/2019  . Type 2 diabetes mellitus without complication, without long-term current use of insulin (HCC)   . Unstable angina (HCC) 02/06/2020    Past Surgical History:  Procedure Laterality Date  . EYE SURGERY    . HEMORROIDECTOMY    . RIGHT/LEFT HEART CATH AND CORONARY ANGIOGRAPHY N/A 02/06/2020   Procedure: RIGHT/LEFT HEART CATH AND CORONARY ANGIOGRAPHY;  Surgeon: Marykay Lex, MD;  Location: [facility] INVASIVE CV LAB;  Service: Cardiovascular;  Laterality: N/A;  . WISDOM TOOTH EXTRACTION      Social History   Socioeconomic History  . Marital status: Married    Spouse name: Not on file  . Number of children: Not on file  . Years of education: Not on file  . Highest education level: Not on file  Occupational History  . Not on file  Tobacco Use  . Smoking status: Former Smoker    Quit date: 04/20/2000    Years since quitting: 19.8  . Smokeless tobacco: Never Used  Vaping Use  . Vaping Use: Never used  Substance and Sexual Activity  . Alcohol use: Never  . Drug use: Never  . Sexual activity: Not on file  Other Topics Concern  . Not on file  Social History Narrative   Works 3rd shift    Social Determinants of Corporate investment banker Strain:   . Difficulty of Paying Living Expenses: Not  on file  Food Insecurity:   . Worried About Programme researcher, broadcasting/film/video in the Last Year: Not on file  . Ran Out of Food in the Last Year: Not on file  Transportation Needs:   . Lack of Transportation (Medical): Not on file  . Lack of Transportation (Non-Medical): Not on file  Physical Activity:   . Days of Exercise per Week: Not on file  . Minutes of Exercise per Session: Not on file  Stress:   . Feeling of Stress : Not on file  Social Connections:   . Frequency of Communication with Friends and Family: Not on file  . Frequency of Social Gatherings with Friends and Family: Not on file  . Attends Religious Services: Not on file  . Active Member of Clubs or Organizations: Not on file  . Attends Banker Meetings: Not on file  . Marital Status: Not on file  Intimate Partner Violence:   . Fear of Current or Ex-Partner: Not on file  . Emotionally Abused: Not on file  . Physically Abused: Not on file  . Sexually Abused: Not on file    Current Outpatient Medications on File Prior to Visit  Medication Sig Dispense Refill  . acetaminophen (TYLENOL) 325 MG tablet Take  2 tablets (650 mg total) by mouth every 6 (six) hours as needed for mild pain (or Fever >/= 101).    . furosemide (LASIX) 20 MG tablet Take 20 mg by mouth daily. Take with 40 mg for a total of 60 mg    . furosemide (LASIX) 40 MG tablet Take 40 mg by mouth daily. Take with 20 mg for a total of 60 mg    . glipiZIDE (GLUCOTROL) 5 MG tablet Take 5 mg by mouth daily.    Marland Kitchen losartan (COZAAR) 25 MG tablet Take 25 mg by mouth daily.    . metFORMIN (GLUCOPHAGE) 1000 MG tablet Take 1,000 mg by mouth 2 (two) times daily with a meal.     . metoprolol succinate (TOPROL-XL) 25 MG 24 hr tablet Take 0.5 tablets (12.5 mg total) by mouth daily. 45 tablet 1  . potassium chloride SA (KLOR-CON) 20 MEQ tablet Take 20 mEq by mouth daily.    . rosuvastatin (CRESTOR) 10 MG tablet Take 10 mg by mouth daily.    . sertraline (ZOLOFT) 50 MG tablet  Take 50 mg by mouth daily.    . TRULICITY 4.5 MG/0.5ML SOPN Inject 4.5 mg into the skin once a week. Friday     No current facility-administered medications on file prior to visit.    Allergies  Allergen Reactions  . Shellfish-Derived Products Hives  . Coconut Oil Hives and Rash    Family History  Problem Relation Age of Onset  . Diabetes Mother   . Hypertension Father   . Hypertension Brother   . Hypertension Maternal Grandfather   . Hypertension Paternal Grandmother     BP 132/64   Pulse 82   Ht 5\' 11"  (1.803 m)   Wt 262 lb (118.8 kg)   SpO2 97%   BMI 36.54 kg/m    Review of Systems denies blurry vision, sob, n/v, urinary frequency, and memory loss.  He has weight gain.  No change in chronic anxiety.     Objective:   Physical Exam VITAL SIGNS:  See vs page GENERAL: no distress Pulses: dorsalis pedis intact bilat.   MSK: no deformity of the feet.   CV: 3+ left, and 2+ right leg edema Skin:  no ulcer on the feet, but there are calluses, and the skin is cracking normal color and temp on the feet. Neuro: sensation is intact to touch on the feet, but decreased from normal.    Lab Results  Component Value Date   HGBA1C 8.2 (A) 02/29/2020    Lab Results  Component Value Date   CREATININE 0.87 01/31/2020   BUN 15 01/31/2020   NA 141 02/06/2020   K 3.8 02/06/2020   CL 99 01/31/2020   CO2 27 01/31/2020   I have reviewed outside records, and summarized: Pt was noted to have elevated A1c, and referred here.  He was seen by cardiol for SVT.  Coronary arteriography was normal      Assessment & Plan:  Type 2 DM, new to me, uncontrolled.   Patient Instructions  good diet and exercise significantly improve the control of your diabetes.  please let me know if you wish to be referred to a dietician.  high blood sugar is very risky to your health.  you should see an eye doctor and dentist every year.  It is very important to get all recommended vaccinations.    Controlling your blood pressure and cholesterol drastically reduces the damage diabetes does to your body.  Those who  smoke should quit.  Please discuss these with your doctor.   check your blood sugar once a day.  vary the time of day when you check, between before the 3 meals, and at bedtime.  also check if you have symptoms of your blood sugar being too high or too low.  please keep a record of the readings and bring it to your next appointment here (or you can bring the meter itself).  You can write it on any piece of paper.  please call us sooner if your blood sugar goes below 70, or if you have a lot of readings over 200. I have sent a prescription to your pharmacy, to add "Invokana."  Please continue the same other diabetes medications.  Please come back for a follow-up appointment in 2 months.

## 2020-02-29 NOTE — Patient Instructions (Signed)
good diet and exercise significantly improve the control of your diabetes.  please let me know if you wish to be referred to a dietician.  high blood sugar is very risky to your health.  you should see an eye doctor and dentist every year.  It is very important to get all recommended vaccinations.   Controlling your blood pressure and cholesterol drastically reduces the damage diabetes does to your body.  Those who smoke should quit.  Please discuss these with your doctor.   check your blood sugar once a day.  vary the time of day when you check, between before the 3 meals, and at bedtime.  also check if you have symptoms of your blood sugar being too high or too low.  please keep a record of the readings and bring it to your next appointment here (or you can bring the meter itself).  You can write it on any piece of paper.  please call us sooner if your blood sugar goes below 70, or if you have a lot of readings over 200. I have sent a prescription to your pharmacy, to add "Invokana."  Please continue the same other diabetes medications.  Please come back for a follow-up appointment in 2 months.

## 2020-03-08 ENCOUNTER — Other Ambulatory Visit: Payer: Self-pay

## 2020-03-08 ENCOUNTER — Ambulatory Visit (HOSPITAL_COMMUNITY)
Admission: RE | Admit: 2020-03-08 | Discharge: 2020-03-08 | Disposition: A | Discharge status: Home / Self Care | Payer: 59 | Source: Ambulatory Visit | Attending: Physician Assistant | Admitting: Physician Assistant

## 2020-03-08 ENCOUNTER — Ambulatory Visit (INDEPENDENT_AMBULATORY_CARE_PROVIDER_SITE_OTHER): Payer: 59 | Admitting: Physician Assistant

## 2020-03-08 VITALS — BP 124/74 | HR 63 | Temp 98.1°F | Resp 20 | Ht 71.0 in | Wt 259.7 lb

## 2020-03-08 DIAGNOSIS — I872 Venous insufficiency (chronic) (peripheral): Secondary | ICD-10-CM | POA: Insufficient documentation

## 2020-03-08 DIAGNOSIS — I890 Lymphedema, not elsewhere classified: Secondary | ICD-10-CM

## 2020-03-08 NOTE — Progress Notes (Signed)
[facility] [place]   Reason for referral: Swollen B legs  History of Present Illness  Victor [name] is a 49 y.o. male who presents with chief complaint: swollen leg.  Patient notes, onset of swelling since 2015 with no history of injury or surgery. The edema is associated with prolonged sitting and standing.  The patient has had no history of DVT, no history of varicose vein, no history of venous stasis ulcers, no history of  Lymphedema and no history of skin changes in lower legs.  There is a family history of edema  disorders.  The patient has  used knee high compression stockings in the past.  He sleeps in a recliner and has put a "beanbag" under his mattress to elevate his feet when he does sleep in the bed.    Past Medical History:  Diagnosis Date  . Acute on chronic congestive heart failure (HCC)   . Arthritis   . Gout   . Hay fever   . High cholesterol   . Hypertension   . Mental health problem   . Near syncope 12/27/2019  . Pre-syncope 12/27/2019  . Shortness of breath 02/06/2020  . Syncope 12/26/2019  . Type 2 diabetes mellitus without complication, without long-term current use of insulin (HCC)   . Unstable angina (HCC) 02/06/2020    Past Surgical History:  Procedure Laterality Date  . EYE SURGERY    . HEMORROIDECTOMY    . RIGHT/LEFT HEART CATH AND CORONARY ANGIOGRAPHY N/A 02/06/2020   Procedure: RIGHT/LEFT HEART CATH AND CORONARY ANGIOGRAPHY;  Surgeon: Marykay Lex, MD;  Location: [facility] INVASIVE CV LAB;  Service: Cardiovascular;  Laterality: N/A;  . WISDOM TOOTH EXTRACTION      Social History   Socioeconomic History  . Marital status: Married    Spouse name: Not on file  . Number of children: Not on file  . Years of education: Not on file  . Highest education level: Not on file  Occupational History  . Not on file  Tobacco Use  . Smoking status: Former Smoker    Quit date: 04/20/2000    Years since quitting: 19.8  . Smokeless tobacco: Never  Used  Vaping Use  . Vaping Use: Never used  Substance and Sexual Activity  . Alcohol use: Never  . Drug use: Never  . Sexual activity: Not on file  Other Topics Concern  . Not on file  Social History Narrative   Works 3rd shift    Social Determinants of Corporate investment banker Strain:   . Difficulty of Paying Living Expenses: Not on file  Food Insecurity:   . Worried About Programme researcher, broadcasting/film/video in the Last Year: Not on file  . Ran Out of Food in the Last Year: Not on file  Transportation Needs:   . Lack of Transportation (Medical): Not on file  . Lack of Transportation (Non-Medical): Not on file  Physical Activity:   . Days of Exercise per Week: Not on file  . Minutes of Exercise per Session: Not on file  Stress:   . Feeling of Stress : Not on file  Social Connections:   . Frequency of Communication with Friends and Family: Not on file  . Frequency of Social Gatherings with Friends and Family: Not on file  . Attends Religious Services: Not on file  . Active Member of Clubs or Organizations: Not on file  . Attends Banker Meetings: Not on file  . Marital Status:  Not on file  Intimate Partner Violence:   . Fear of Current or Ex-Partner: Not on file  . Emotionally Abused: Not on file  . Physically Abused: Not on file  . Sexually Abused: Not on file    Family History  Problem Relation Age of Onset  . Diabetes Mother   . Hypertension Father   . Hypertension Brother   . Hypertension Maternal Grandfather   . Hypertension Paternal Grandmother     Current Outpatient Medications on File Prior to Visit  Medication Sig Dispense Refill  . acetaminophen (TYLENOL) 325 MG tablet Take 2 tablets (650 mg total) by mouth every 6 (six) hours as needed for mild pain (or Fever >/= 101).    . canagliflozin (INVOKANA) 300 MG TABS tablet Take 1 tablet (300 mg total) by mouth daily before breakfast. 30 tablet 11  . furosemide (LASIX) 20 MG tablet Take 20 mg by mouth daily.  Take with 40 mg for a total of 60 mg    . furosemide (LASIX) 40 MG tablet Take 40 mg by mouth daily. Take with 20 mg for a total of 60 mg    . glipiZIDE (GLUCOTROL) 5 MG tablet Take 5 mg by mouth daily.    . hydrocortisone (ANUSOL-HC) 25 MG suppository Place rectally.    Marland Kitchen losartan (COZAAR) 25 MG tablet Take 25 mg by mouth daily.    . metFORMIN (GLUCOPHAGE) 1000 MG tablet Take 1,000 mg by mouth 2 (two) times daily with a meal.     . metoprolol succinate (TOPROL-XL) 25 MG 24 hr tablet Take 0.5 tablets (12.5 mg total) by mouth daily. 45 tablet 1  . potassium chloride SA (KLOR-CON) 20 MEQ tablet Take 20 mEq by mouth daily.    . rosuvastatin (CRESTOR) 10 MG tablet Take 10 mg by mouth daily.    . sertraline (ZOLOFT) 50 MG tablet Take 50 mg by mouth daily.    . TRULICITY 4.5 MG/0.5ML SOPN Inject 4.5 mg into the skin once a week. Friday    . witch hazel-glycerin (TUCKS) pad Place rectally.     No current facility-administered medications on file prior to visit.    Allergies as of 03/08/2020 - Review Complete 03/08/2020  Allergen Reaction Noted  . Shellfish-derived products Hives 07/22/2019  . Coconut oil Hives and Rash 03/16/2019     ROS:   General:  No weight loss, Fever, chills  HEENT: No recent headaches, no nasal bleeding, no visual changes, no sore throat  Neurologic: No dizziness, blackouts, seizures. No recent symptoms of stroke or mini- stroke. No recent episodes of slurred speech, or temporary blindness.  Cardiac: No recent episodes of chest pain/pressure, no shortness of breath at rest.  No shortness of breath with exertion.  Denies history of atrial fibrillation or irregular heartbeat  Vascular: No history of rest pain in feet.  No history of claudication.  No history of non-healing ulcer, No history of DVT   Pulmonary: No home oxygen, no productive cough, no hemoptysis,  No asthma or wheezing  Musculoskeletal:  [ ]  Arthritis, [ ]  Low back pain,  [ ]  Joint  pain  Hematologic:No history of hypercoagulable state.  No history of easy bleeding.  No history of anemia  Gastrointestinal: No hematochezia or melena,  No gastroesophageal reflux, no trouble swallowing  Urinary: [ ]  chronic Kidney disease, [ ]  on HD - [ ]  MWF or [ ]  TTHS, [ ]  Burning with urination, [ ]  Frequent urination, [ ]  Difficulty urinating;   Skin: No  rashes  Psychological: No history of anxiety,  No history of depression  Physical Examination  Vitals:   03/08/20 1107  BP: 124/74  Pulse: 63  Resp: 20  Temp: 98.1 F (36.7 C)  TempSrc: Temporal  SpO2: 96%  Weight: 259 lb 11.2 oz (117.8 kg)  Height: 5\' 11"  (1.803 m)    Body mass index is 36.22 kg/m.  General:  Alert and oriented, no acute distress HEENT: Normal Neck: No bruit or JVD Pulmonary: Clear to auscultation bilaterally Cardiac: Regular Rate and Rhythm without murmur Abdomen: Soft, non-tender, non-distended, no mass, no scars Skin: No rash Extremity Pulses:  2+ radial, brachial, femoral, doppler signals dorsalis pedis, posterior tibial bilaterally Musculoskeletal: Left edema > right with woody feel to tissue and pitting edema into the dorsum of his feet B.  No skin changes, no evidence of wounds.  Neurologic: Upper and lower extremity motor intact and symmetric  DATA: Venous Reflux Times  +--------------+---------+------+-----------+------------+--------+  RIGHT     Reflux NoRefluxReflux TimeDiameter cmsComments               Yes                   +--------------+---------+------+-----------+------------+--------+  GSV at SFJ                  0.79        +--------------+---------+------+-----------+------------+--------+  GSV prox thigh      yes  >500 ms   0.49        +--------------+---------+------+-----------+------------+--------+  GSV mid thigh                 0.48         +--------------+---------+------+-----------+------------+--------+  GSV dist thigh                0.43        +--------------+---------+------+-----------+------------+--------+  GSV at knee                  0.36        +--------------+---------+------+-----------+------------+--------+  GSV prox calf       yes  >500 ms   0.41        +--------------+---------+------+-----------+------------+--------+  GSV mid calf                 0.39        +--------------+---------+------+-----------+------------+--------+  SSV Pop Fossa                 0.37        +--------------+---------+------+-----------+------------+--------+  SSV prox calf                 0.21        +--------------+---------+------+-----------+------------+--------+  SSV mid calf                 0.35        +--------------+---------+------+-----------+------------+--------+  AASV                     0.36        +--------------+---------+------+-----------+------------+--------+     +--------------+---------+------+-----------+------------+--------+  LEFT     Reflux NoRefluxReflux TimeDiameter cmsComments               Yes                   +--------------+---------+------+-----------+------------+--------+  GSV at SFJ                  0.99        +--------------+---------+------+-----------+------------+--------+  GSV prox thigh      yes  >500 ms   0.54        +--------------+---------+------+-----------+------------+--------+  GSV mid thigh                 0.48        +--------------+---------+------+-----------+------------+--------+  GSV dist thigh                 0.58        +--------------+---------+------+-----------+------------+--------+  GSV at knee                  0.45        +--------------+---------+------+-----------+------------+--------+  GSV prox calf                 0.37        +--------------+---------+------+-----------+------------+--------+  GSV mid calf                 0.34        +--------------+---------+------+-----------+------------+--------+  SSV Pop Fossa                 0.38        +--------------+---------+------+-----------+------------+--------+  SSV prox calf                 0.43        +--------------+---------+------+-----------+------------+--------+  SSV mid calf                 0.48        +--------------+---------+------+-----------+------------+--------+  AASV                     0.32        +--------------+---------+------+-----------+------------+--------+        Summary:  Right:  - No evidence of deep vein thrombosis seen in the right lower extremity,  from the common femoral through the popliteal veins.  - No evidence of superficial venous reflux seen in the right short  saphenous vein.  - Venous reflux is noted in the right greater saphenous vein in the  proximal thigh.  - Venous reflux is noted in the right greater saphenous vein in the  proximal calf.    Left:  - No evidence of deep vein thrombosis seen in the left lower extremity,  from the common femoral through the popliteal veins.  - No evidence of superficial venous reflux seen in the left short  saphenous vein.  - Venous reflux is noted in the left greater saphenous vein in the  proximal thigh.      Assessment: Lymphedema B LE since 2015 He does evidence of right proximal GSV reflux and calf without SFJ reflux and left  proximal GSV reflux without SFJ reflux.   He has warm feet to touch with intact brisk doppler signals.  He is not at risk of limb loss.  Plan: I will order Bio TAB lymphedema compression devises to start, elevation with handout given to him to demonstrate proper elevation technique.  Try to avoid prolonged dependent sitting and standing.  Mobility increase as tolerates.  Continue with compression socks during the day and take them off at night.    He will f/u in 1-2 months for evaluation with DR. Edilia Boickson. Mosetta PigeonEmma Maureen Muneer Leider PA-C [facility] [place] Office: [phone]  MD in clinic [place]

## 2020-03-19 ENCOUNTER — Ambulatory Visit (INDEPENDENT_AMBULATORY_CARE_PROVIDER_SITE_OTHER): Payer: 59 | Admitting: Cardiology

## 2020-03-19 ENCOUNTER — Encounter: Payer: Self-pay | Admitting: Cardiology

## 2020-03-19 ENCOUNTER — Other Ambulatory Visit: Payer: Self-pay

## 2020-03-19 VITALS — BP 114/86 | HR 78 | Ht 71.0 in | Wt 268.0 lb

## 2020-03-19 DIAGNOSIS — I471 Supraventricular tachycardia: Secondary | ICD-10-CM

## 2020-03-19 DIAGNOSIS — I4729 Other ventricular tachycardia: Secondary | ICD-10-CM

## 2020-03-19 DIAGNOSIS — I472 Ventricular tachycardia: Secondary | ICD-10-CM

## 2020-03-19 DIAGNOSIS — R600 Localized edema: Secondary | ICD-10-CM

## 2020-03-19 DIAGNOSIS — E1369 Other specified diabetes mellitus with other specified complication: Secondary | ICD-10-CM

## 2020-03-19 DIAGNOSIS — E119 Type 2 diabetes mellitus without complications: Secondary | ICD-10-CM

## 2020-03-19 DIAGNOSIS — E669 Obesity, unspecified: Secondary | ICD-10-CM

## 2020-03-19 DIAGNOSIS — R5383 Other fatigue: Secondary | ICD-10-CM | POA: Diagnosis not present

## 2020-03-19 NOTE — Progress Notes (Signed)
Cardiology Office Note:    Date:  03/19/2020   ID:  [name], DOB 06-20-1970, MRN [id number]  PCP:  Nechama Guard, FNP  Cardiologist:  Thomasene Ripple, DO  Electrophysiologist:  None   Referring MD: Nechama Guard, FNP   " I am doing   History of Present Illness:    Victor [name] is a 49 y.o. male with a hx of congestive heart failure, significant bilateral leg edema, hypertension, hyperlipidemia, diabetes mellitus or recurrent syncope, obesity and NSVT on his ZIO monitor.  Patient is also status post right and left heart catheterization which was unremarkable.  I last saw the patient February 19, 2020 at that time we discussed his monitor results which show NSVT I did start the patient on Toprol-XL 12.5 mg daily.  In addition we talked about the results of his right and left heart catheterization.    In the interim the patient has been able to see vascular surgery and endocrinology.  He is here today for follow-up visit.   Past Medical History:  Diagnosis Date  . Acute on chronic congestive heart failure (HCC)   . Arthritis   . Gout   . Hay fever   . High cholesterol   . Hypertension   . Mental health problem   . Near syncope 12/27/2019  . Pre-syncope 12/27/2019  . Shortness of breath 02/06/2020  . Syncope 12/26/2019  . Type 2 diabetes mellitus without complication, without long-term current use of insulin (HCC)   . Unstable angina (HCC) 02/06/2020    Past Surgical History:  Procedure Laterality Date  . EYE SURGERY    . HEMORROIDECTOMY    . RIGHT/LEFT HEART CATH AND CORONARY ANGIOGRAPHY N/A 02/06/2020   Procedure: RIGHT/LEFT HEART CATH AND CORONARY ANGIOGRAPHY;  Surgeon: Marykay Lex, MD;  Location: [facility] INVASIVE CV LAB;  Service: Cardiovascular;  Laterality: N/A;  . WISDOM TOOTH EXTRACTION      Current Medications: Current Meds  Medication Sig  . acetaminophen (TYLENOL) 325 MG tablet Take 2 tablets (650 mg total) by mouth every 6 (six) hours as needed for mild  pain (or Fever >/= 101).  . canagliflozin (INVOKANA) 300 MG TABS tablet Take 1 tablet (300 mg total) by mouth daily before breakfast.  . furosemide (LASIX) 20 MG tablet Take 20 mg by mouth daily. Take with 40 mg for a total of 60 mg  . furosemide (LASIX) 40 MG tablet Take 40 mg by mouth daily. Take with 20 mg for a total of 60 mg  . glipiZIDE (GLUCOTROL) 5 MG tablet Take 5 mg by mouth daily.  Marland Kitchen losartan (COZAAR) 25 MG tablet Take 25 mg by mouth daily.  . metFORMIN (GLUCOPHAGE) 1000 MG tablet Take 1,000 mg by mouth 2 (two) times daily with a meal.   . metoprolol succinate (TOPROL-XL) 25 MG 24 hr tablet Take 0.5 tablets (12.5 mg total) by mouth daily.  . potassium chloride SA (KLOR-CON) 20 MEQ tablet Take 20 mEq by mouth daily.  . rosuvastatin (CRESTOR) 10 MG tablet Take 10 mg by mouth daily.  . sertraline (ZOLOFT) 50 MG tablet Take 50 mg by mouth daily.  . TRULICITY 4.5 MG/0.5ML SOPN Inject 4.5 mg into the skin once a week. Friday  . witch hazel-glycerin (TUCKS) pad Place rectally.     Allergies:   Shellfish-derived products and Coconut oil   Social History   Socioeconomic History  . Marital status: Married    Spouse name: Not on file  . Number of children: Not  on file  . Years of education: Not on file  . Highest education level: Not on file  Occupational History  . Not on file  Tobacco Use  . Smoking status: Former Smoker    Quit date: 04/20/2000    Years since quitting: 19.9  . Smokeless tobacco: Never Used  Vaping Use  . Vaping Use: Never used  Substance and Sexual Activity  . Alcohol use: Never  . Drug use: Never  . Sexual activity: Not on file  Other Topics Concern  . Not on file  Social History Narrative   Works 3rd shift    Social Determinants of Corporate investment bankerHealth   Financial Resource Strain:   . Difficulty of Paying Living Expenses: Not on file  Food Insecurity:   . Worried About Programme researcher, broadcasting/film/videounning Out of Food in the Last Year: Not on file  . Ran Out of Food in the Last Year: Not on  file  Transportation Needs:   . Lack of Transportation (Medical): Not on file  . Lack of Transportation (Non-Medical): Not on file  Physical Activity:   . Days of Exercise per Week: Not on file  . Minutes of Exercise per Session: Not on file  Stress:   . Feeling of Stress : Not on file  Social Connections:   . Frequency of Communication with Friends and Family: Not on file  . Frequency of Social Gatherings with Friends and Family: Not on file  . Attends Religious Services: Not on file  . Active Member of Clubs or Organizations: Not on file  . Attends BankerClub or Organization Meetings: Not on file  . Marital Status: Not on file     Family History: The patient's family history includes Diabetes in his mother; Hypertension in his brother, father, maternal grandfather, and paternal grandmother.  ROS:   Review of Systems  Constitution: Negative for decreased appetite, fever and weight gain.  HENT: Negative for congestion, ear discharge, hoarse voice and sore throat.   Eyes: Negative for discharge, redness, vision loss in right eye and visual halos.  Cardiovascular: Negative for chest pain, dyspnea on exertion, leg swelling, orthopnea and palpitations.  Respiratory: Negative for cough, hemoptysis, shortness of breath and snoring.   Endocrine: Negative for heat intolerance and polyphagia.  Hematologic/Lymphatic: Negative for bleeding problem. Does not bruise/bleed easily.  Skin: Negative for flushing, nail changes, rash and suspicious lesions.  Musculoskeletal: Negative for arthritis, joint pain, muscle cramps, myalgias, neck pain and stiffness.  Gastrointestinal: Negative for abdominal pain, bowel incontinence, diarrhea and excessive appetite.  Genitourinary: Negative for decreased libido, genital sores and incomplete emptying.  Neurological: Negative for brief paralysis, focal weakness, headaches and loss of balance.  Psychiatric/Behavioral: Negative for altered mental status, depression  and suicidal ideas.  Allergic/Immunologic: Negative for HIV exposure and persistent infections.    EKGs/Labs/Other Studies Reviewed:    The following studies were reviewed today:   EKG:  The ekg ordered today demonstrates   Zio monitor: The patient wore the monitor for 6 days 10 hours starting January 31, 2020 . Indication: Syncope The minimum heart rate was 54 bpm, maximum heart rate was 231 bpm, and average heart rate was 83 bpm. Predominant underlying rhythm was Sinus Rhythm.  2 Ventricular Tachycardia runs occurred, the run with the fastest interval lasting 5 beats with a maximum rate of 231 bpm (average 146 bpm); the run with the fastest interval was also the longest.   1 run of Supraventricular Tachycardia occurred lasting 4 beats with a maximal rate of 130  bpm (average 122 bpm).  Premature atrial complexes were rare less than 1%. Premature Ventricular complexes were rare less than 1%.  No pauses, No AV block and no atrial fibrillation present. No patient triggered events were noted.   Conclusion: This study is remarkable for the following: 1. 2 episodes of nonsustained ventricular tachycardia. 2. One episode paroxysmal supraventricular tachycardia.   Right and left heart catheterization   The left ventricular systolic function is normal. The left ventricular ejection fraction is 55-65% by visual estimate.  LV end diastolic pressure is normal.  Right Heart Cath pressures are normal. Low normal cardiac output-index.  --------------------------  Angiographically normal coronary arteries with Left dominant system and cloacal Left Main  SUMMARY  Angiographic normal coronary arteries with dominant Circumflex (LCx).   Cloacal Left Main.  Normal Left and Right Heart Pressures-LVEDP 12 mmHg, PCWP 9 mmHg.  Cardiac Output & Index: Fick 6.41, 2.79; thermal 5.08, 2.21  TTE IMPRESSIONS  1. Left  ventricular ejection fraction, by estimation, is 60 to 65%. The left ventricle has normal function. The left ventricle has no regional    ricular diastolic parameters were  normal.  2. Right ventricular systolic function is normal. The right ventricular  size is normal. There is normal pulmonary artery systolic pressure.  3. Cannot r/o small PFO by color.  4. The mitral valve is normal in structure. Trivial mitral valve  regurgitation. No evidence of mitral stenosis.  5. The aortic valve is normal in structure. Aortic valve regurgitation is  not visualized. Mild to moderate aortic valve sclerosis/calcification is  present, without any evidence of aortic stenosis.  6. The inferior vena cava is normal in size with greater than 50%  respiratory variability, suggesting right atrial pressure of 3 mmHg.   Recent Labs: 12/27/2019: B Natriuretic Peptide 173.8; Magnesium 1.8; TSH 1.969 12/28/2019: ALT 30 01/31/2020: BUN 15; Creatinine, Ser 0.87; Platelets 239 02/06/2020: Hemoglobin 13.6; Potassium 3.8; Sodium 141  Recent Lipid Panel    Component Value Date/Time   CHOL 168 12/27/2019 1228   TRIG 185 (H) 12/27/2019 1228   HDL 32 (L) 12/27/2019 1228   CHOLHDL 5.3 12/27/2019 1228   VLDL 37 12/27/2019 1228   LDLCALC 99 12/27/2019 1228    Physical Exam:    VS:  BP 114/86   Pulse 78   Ht 5\' 11"  (1.803 m)   Wt 268 lb (121.6 kg)   SpO2 99%   BMI 37.38 kg/m     Wt Readings from Last 3 Encounters:  03/19/20 268 lb (121.6 kg)  03/08/20 259 lb 11.2 oz (117.8 kg)  02/29/20 262 lb (118.8 kg)     GEN: Well nourished, well developed in no acute distress HEENT: Normal NECK: No JVD; No carotid bruits LYMPHATICS: No lymphadenopathy CARDIAC: S1S2 noted,RRR, no murmurs, rubs, gallops RESPIRATORY:  Clear to auscultation without rales, wheezing or rhonchi  ABDOMEN: Soft, non-tender, non-distended, +bowel sounds, no guarding. EXTREMITIES: No edema, No cyanosis, no clubbing MUSCULOSKELETAL:  No  deformity  SKIN: Warm and dry NEUROLOGIC:  Alert and oriented x 3, non-focal PSYCHIATRIC:  Normal affect, good insight  ASSESSMENT:    1. Bilateral leg edema   2. Fatigue, unspecified type   3. Other specified diabetes mellitus with other specified complication, unspecified whether long term insulin use (HCC)   4. PSVT (paroxysmal supraventricular tachycardia) (HCC)   5. NSVT (nonsustained ventricular tachycardia) (HCC)   6. Type 2 diabetes mellitus without complication, without long-term current use of insulin (HCC)   7. Obesity (BMI 30-39.9)  PLAN:    He appears to be doing fine from a cardiovascular standpoint.  He did see vascular surgery and they have ordered a biotype lymphedema compression device with the patient.  He is waiting to receive this to start and hopefully this will help with his lymphedema.  He also continues to be on diuretic. We will also place in a nephrology consultation to make sure that nephrotic syndrome is not playing a role here as well. No angina symptoms. He has had some relief with the beta-blocker we will continue this medication for now. Diabetes is being managed by his PCP and endocrinology.  The patient is in agreement with the above plan. The patient left the office in stable condition.  The patient will follow up in 6 months or sooner if needed.   Medication Adjustments/Labs and Tests Ordered: Current medicines are reviewed at length with the patient today.  Concerns regarding medicines are outlined above.  Orders Placed This Encounter  Procedures  . Ambulatory referral to Nephrology   No orders of the defined types were placed in this encounter.   Patient Instructions  Medication Instructions:  Your physician recommends that you continue on your current medications as directed. Please refer to the Current Medication list given to you today.  *If you need a refill on your cardiac medications before your next appointment, please call your  pharmacy*  Follow-Up: At [facility], you and your health needs are our priority.  As part of our continuing mission to provide you with exceptional heart care, we have created designated Provider Care Teams.  These Care Teams include your primary Cardiologist (physician) and Advanced Practice Providers (APPs -  Physician Assistants and Nurse Practitioners) who all work together to provide you with the care you need, when you need it.   Your next appointment:   6 month(s)  The format for your next appointment:   In Person  Provider:    Thomasene Ripple, DO    Other Instructions You have been referred to see a nephrologist.      Adopting a Healthy Lifestyle.  Know what a healthy weight is for you (roughly BMI <25) and aim to maintain this   Aim for 7+ servings of fruits and vegetables daily   65-80+ fluid ounces of water or unsweet tea for healthy kidneys   Limit to max 1 drink of alcohol per day; avoid smoking/tobacco   Limit animal fats in diet for cholesterol and heart health - choose grass fed whenever available   Avoid highly processed foods, and foods high in saturated/trans fats   Aim for low stress - take time to unwind and care for your mental health   Aim for 150 min of moderate intensity exercise weekly for heart health, and weights twice weekly for bone health   Aim for 7-9 hours of sleep daily   When it comes to diets, agreement about the perfect plan isnt easy to find, even among the experts. Experts at the [facility] [employer] developed an idea known as the Healthy Eating Plate. Just imagine a plate divided into logical, healthy portions.   The emphasis is on diet quality:   Load up on vegetables and fruits - one-half of your plate: Aim for color and variety, and remember that potatoes dont count.   Go for whole grains - one-quarter of your plate: Whole wheat, barley, wheat berries, quinoa, oats, brown rice, and foods made with them. If you want  pasta, go with whole  wheat pasta.   Protein power - one-quarter of your plate: Fish, chicken, beans, and nuts are all healthy, versatile protein sources. Limit red meat.   The diet, however, does go beyond the plate, offering a few other suggestions.   Use healthy plant oils, such as olive, canola, soy, corn, sunflower and peanut. Check the labels, and avoid partially hydrogenated oil, which have unhealthy trans fats.   If youre thirsty, drink water. Coffee and tea are good in moderation, but skip sugary drinks and limit milk and dairy products to one or two daily servings.   The type of carbohydrate in the diet is more important than the amount. Some sources of carbohydrates, such as vegetables, fruits, whole grains, and beans-are healthier than others.   Finally, stay active  Signed, Thomasene Ripple, DO  03/19/2020 8:25 AM    [facility]

## 2020-03-19 NOTE — Patient Instructions (Signed)
Medication Instructions:  Your physician recommends that you continue on your current medications as directed. Please refer to the Current Medication list given to you today.  *If you need a refill on your cardiac medications before your next appointment, please call your pharmacy*  Follow-Up: At [facility], you and your health needs are our priority.  As part of our continuing mission to provide you with exceptional heart care, we have created designated Provider Care Teams.  These Care Teams include your primary Cardiologist (physician) and Advanced Practice Providers (APPs -  Physician Assistants and Nurse Practitioners) who all work together to provide you with the care you need, when you need it.   Your next appointment:   6 month(s)  The format for your next appointment:   In Person  Provider:    Thomasene Ripple, DO    Other Instructions You have been referred to see a nephrologist.

## 2020-04-30 ENCOUNTER — Encounter: Payer: Self-pay | Admitting: Endocrinology

## 2020-04-30 ENCOUNTER — Ambulatory Visit (INDEPENDENT_AMBULATORY_CARE_PROVIDER_SITE_OTHER): Payer: Self-pay | Admitting: Endocrinology

## 2020-04-30 ENCOUNTER — Other Ambulatory Visit: Payer: Self-pay

## 2020-04-30 VITALS — BP 116/88 | HR 97 | Ht 71.0 in | Wt 264.0 lb

## 2020-04-30 DIAGNOSIS — E119 Type 2 diabetes mellitus without complications: Secondary | ICD-10-CM

## 2020-04-30 LAB — POCT GLYCOSYLATED HEMOGLOBIN (HGB A1C): Hemoglobin A1C: 8.9 % — AB (ref 4.0–5.6)

## 2020-04-30 MED ORDER — BROMOCRIPTINE MESYLATE 2.5 MG PO TABS: 1.2500 mg | ORAL_TABLET | Freq: Every day | ORAL | 3 refills | Status: DC

## 2020-04-30 NOTE — Progress Notes (Signed)
Subjective:    Patient ID: Victor [name], male    DOB: 22-Apr-1970, 50 y.o.   MRN: [id number]  HPI Pt returns for f/u of diabetes mellitus: DM type: 2 Dx'ed: 2012 Complications: none Therapy: Trulicity and 3 oral meds.  DKA: never Severe hypoglycemia: never Pancreatitis: never Pancreatic imaging: normal on 2021 CT SDOH: none Other: he never been on insulin Interval history: Pt says cbg's are in the mid-100's.  He takes meds meds as rx'ed.   Past Medical History:  Diagnosis Date  . Acute on chronic congestive heart failure (HCC)   . Arthritis   . Gout   . Hay fever   . High cholesterol   . Hypertension   . Mental health problem   . Near syncope 12/27/2019  . Pre-syncope 12/27/2019  . Shortness of breath 02/06/2020  . Syncope 12/26/2019  . Type 2 diabetes mellitus without complication, without long-term current use of insulin (HCC)   . Unstable angina (HCC) 02/06/2020    Past Surgical History:  Procedure Laterality Date  . EYE SURGERY    . HEMORROIDECTOMY    . RIGHT/LEFT HEART CATH AND CORONARY ANGIOGRAPHY N/A 02/06/2020   Procedure: RIGHT/LEFT HEART CATH AND CORONARY ANGIOGRAPHY;  Surgeon: Marykay Lex, MD;  Location: [facility] INVASIVE CV LAB;  Service: Cardiovascular;  Laterality: N/A;  . WISDOM TOOTH EXTRACTION      Social History   Socioeconomic History  . Marital status: Married    Spouse name: Not on file  . Number of children: Not on file  . Years of education: Not on file  . Highest education level: Not on file  Occupational History  . Not on file  Tobacco Use  . Smoking status: Former Smoker    Quit date: 04/20/2000    Years since quitting: 20.0  . Smokeless tobacco: Never Used  Vaping Use  . Vaping Use: Never used  Substance and Sexual Activity  . Alcohol use: Never  . Drug use: Never  . Sexual activity: Not on file  Other Topics Concern  . Not on file  Social History Narrative   Works 3rd shift    Social Determinants of Research scientist (physical sciences) Strain: Not on file  Food Insecurity: Not on file  Transportation Needs: Not on file  Physical Activity: Not on file  Stress: Not on file  Social Connections: Not on file  Intimate Partner Violence: Not on file    Current Outpatient Medications on File Prior to Visit  Medication Sig Dispense Refill  . acetaminophen (TYLENOL) 325 MG tablet Take 2 tablets (650 mg total) by mouth every 6 (six) hours as needed for mild pain (or Fever >/= 101).    . canagliflozin (INVOKANA) 300 MG TABS tablet Take 1 tablet (300 mg total) by mouth daily before breakfast. 30 tablet 11  . furosemide (LASIX) 20 MG tablet Take 20 mg by mouth daily. Take with 40 mg for a total of 60 mg    . furosemide (LASIX) 40 MG tablet Take 40 mg by mouth daily. Take with 20 mg for a total of 60 mg    . glipiZIDE (GLUCOTROL) 5 MG tablet Take 5 mg by mouth daily.    Marland Kitchen losartan (COZAAR) 25 MG tablet Take 25 mg by mouth daily.    . metFORMIN (GLUCOPHAGE) 1000 MG tablet Take 1,000 mg by mouth 2 (two) times daily with a meal.     . metoprolol succinate (TOPROL-XL) 25 MG 24 hr tablet Take 0.5 tablets (12.5  mg total) by mouth daily. 45 tablet 1  . potassium chloride SA (KLOR-CON) 20 MEQ tablet Take 20 mEq by mouth daily.    . rosuvastatin (CRESTOR) 10 MG tablet Take 10 mg by mouth daily.    . sertraline (ZOLOFT) 50 MG tablet Take 50 mg by mouth daily.    . TRULICITY 4.5 MG/0.5ML SOPN Inject 4.5 mg into the skin once a week. Friday    . witch hazel-glycerin (TUCKS) pad Place rectally.     No current facility-administered medications on file prior to visit.    Allergies  Allergen Reactions  . Shellfish-Derived Products Hives  . Coconut Oil Hives and Rash    Family History  Problem Relation Age of Onset  . Diabetes Mother   . Hypertension Father   . Hypertension Brother   . Hypertension Maternal Grandfather   . Hypertension Paternal Grandmother     BP 116/88   Pulse 97   Ht 5\' 11"  (1.803 m)   Wt 264 lb (119.7 kg)    SpO2 99%   BMI 36.82 kg/m    Review of Systems He denies hypoglycemia    Objective:   Physical Exam VITAL SIGNS:  See vs page GENERAL: no distress Pulses: dorsalis pedis intact bilat.   MSK: no deformity of the feet.   CV: 2+ bilat leg edema.   Skin:  no ulcer on the feet, but there are calluses, and the skin is cracking normal color and temp on the feet.   Neuro: sensation is intact to touch on the feet, but decreased from normal.    Lab Results  Component Value Date   CREATININE 0.87 01/31/2020   BUN 15 01/31/2020   NA 141 02/06/2020   K 3.8 02/06/2020   CL 99 01/31/2020   CO2 27 01/31/2020   Lab Results  Component Value Date   HGBA1C 8.9 (A) 04/30/2020       Assessment & Plan:  Type 2 DM, uncontrolled: I advised insulin, but he declines.   Patient Instructions  check your blood sugar once a day.  vary the time of day when you check, between before the 3 meals, and at bedtime.  also check if you have symptoms of your blood sugar being too high or too low.  please keep a record of the readings and bring it to your next appointment here (or you can bring the meter itself).  You can write it on any piece of paper.  please call 06/28/2020 sooner if your blood sugar goes below 70, or if you have a lot of readings over 200. I have sent a prescription to your pharmacy, to add "bromocriptine," to help your blood sugar. It has possible side effects of nausea and dizziness.  These go away with time.  You can avoid these by taking it at bedtime.   Please continue the same other diabetes medications.   Please come back for a follow-up appointment in 2 months.

## 2020-04-30 NOTE — Patient Instructions (Addendum)
check your blood sugar once a day.  vary the time of day when you check, between before the 3 meals, and at bedtime.  also check if you have symptoms of your blood sugar being too high or too low.  please keep a record of the readings and bring it to your next appointment here (or you can bring the meter itself).  You can write it on any piece of paper.  please call us sooner if your blood sugar goes below 70, or if you have a lot of readings over 200. I have sent a prescription to your pharmacy, to add "bromocriptine," to help your blood sugar. It has possible side effects of nausea and dizziness.  These go away with time.  You can avoid these by taking it at bedtime.   Please continue the same other diabetes medications.   Please come back for a follow-up appointment in 2 months.

## 2020-05-01 ENCOUNTER — Telehealth: Payer: Self-pay | Admitting: Endocrinology

## 2020-05-01 NOTE — Telephone Encounter (Signed)
Patient called to let Dr. Everardo All know that his blood sugars were 229 this morning and 228 at approx. 1:20 p.m. today. Patient states he has not started his new medication (Bromocriptine) yet because it is not ready.

## 2020-05-02 NOTE — Telephone Encounter (Signed)
Do you want [place] to send to a different pharmacy?

## 2020-05-03 NOTE — Telephone Encounter (Signed)
Pt contacted and he stated that he was able to pick up his medication.   Pt stated that he is having all of the side effects that he was warned about but it doing okay.   Pt will let us know if he has any worsening side effects.

## 2020-05-23 ENCOUNTER — Ambulatory Visit: Payer: 59 | Admitting: Vascular Surgery

## 2020-07-09 ENCOUNTER — Telehealth: Payer: Self-pay | Admitting: Endocrinology

## 2020-07-09 ENCOUNTER — Other Ambulatory Visit: Payer: Self-pay

## 2020-07-09 ENCOUNTER — Ambulatory Visit (INDEPENDENT_AMBULATORY_CARE_PROVIDER_SITE_OTHER): Payer: Self-pay | Admitting: Endocrinology

## 2020-07-09 VITALS — BP 118/94 | HR 75 | Ht 71.0 in | Wt 257.4 lb

## 2020-07-09 DIAGNOSIS — E119 Type 2 diabetes mellitus without complications: Secondary | ICD-10-CM

## 2020-07-09 LAB — POCT GLYCOSYLATED HEMOGLOBIN (HGB A1C): Hemoglobin A1C: 8 % — AB (ref 4.0–5.6)

## 2020-07-09 MED ORDER — GLIPIZIDE 5 MG PO TABS
5.0000 mg | ORAL_TABLET | Freq: Every day | ORAL | 3 refills | Status: DC
Start: 2020-07-09 — End: 2021-11-07

## 2020-07-09 MED ORDER — BROMOCRIPTINE MESYLATE 2.5 MG PO TABS
2.5000 mg | ORAL_TABLET | Freq: Every day | ORAL | 3 refills | Status: DC
Start: 2020-07-09 — End: 2021-11-07

## 2020-07-09 MED ORDER — CANAGLIFLOZIN 300 MG PO TABS
300.0000 mg | ORAL_TABLET | Freq: Every day | ORAL | 3 refills | Status: DC
Start: 2020-07-09 — End: 2021-05-16

## 2020-07-09 MED ORDER — METFORMIN HCL ER 500 MG PO TB24: 2000.0000 mg | ORAL_TABLET | Freq: Every day | ORAL | 3 refills | Status: DC

## 2020-07-09 NOTE — Progress Notes (Signed)
Subjective:    Patient ID: Victor [name], male    DOB: 03-Mar-1971, 50 y.o.   MRN: [id number]  HPI Pt returns for f/u of diabetes mellitus: DM type: 2 Dx'ed: 2012 Complications: none Therapy: Trulicity and 4 oral meds.  DKA: never Severe hypoglycemia: never Pancreatitis: never Pancreatic imaging: normal on 2021 CT SDOH: none Other: he never been on insulin Interval history: Pt says cbg's are in the mid-100's.  He takes meds meds as rx'ed.   Past Medical History:  Diagnosis Date  . Acute on chronic congestive heart failure (HCC)   . Arthritis   . Gout   . Hay fever   . High cholesterol   . Hypertension   . Mental health problem   . Near syncope 12/27/2019  . Pre-syncope 12/27/2019  . Shortness of breath 02/06/2020  . Syncope 12/26/2019  . Type 2 diabetes mellitus without complication, without long-term current use of insulin (HCC)   . Unstable angina (HCC) 02/06/2020    Past Surgical History:  Procedure Laterality Date  . EYE SURGERY    . HEMORROIDECTOMY    . RIGHT/LEFT HEART CATH AND CORONARY ANGIOGRAPHY N/A 02/06/2020   Procedure: RIGHT/LEFT HEART CATH AND CORONARY ANGIOGRAPHY;  Surgeon: Marykay Lex, MD;  Location: [facility] INVASIVE CV LAB;  Service: Cardiovascular;  Laterality: N/A;  . WISDOM TOOTH EXTRACTION      Social History   Socioeconomic History  . Marital status: Married    Spouse name: Not on file  . Number of children: Not on file  . Years of education: Not on file  . Highest education level: Not on file  Occupational History  . Not on file  Tobacco Use  . Smoking status: Former Smoker    Quit date: 04/20/2000    Years since quitting: 20.2  . Smokeless tobacco: Never Used  Vaping Use  . Vaping Use: Never used  Substance and Sexual Activity  . Alcohol use: Never  . Drug use: Never  . Sexual activity: Not on file  Other Topics Concern  . Not on file  Social History Narrative   Works 3rd shift    Social Determinants of Research scientist (physical sciences) Strain: Not on file  Food Insecurity: Not on file  Transportation Needs: Not on file  Physical Activity: Not on file  Stress: Not on file  Social Connections: Not on file  Intimate Partner Violence: Not on file    Current Outpatient Medications on File Prior to Visit  Medication Sig Dispense Refill  . acetaminophen (TYLENOL) 325 MG tablet Take 2 tablets (650 mg total) by mouth every 6 (six) hours as needed for mild pain (or Fever >/= 101).    . furosemide (LASIX) 20 MG tablet Take 20 mg by mouth daily. Take with 40 mg for a total of 60 mg    . furosemide (LASIX) 40 MG tablet Take 40 mg by mouth daily. Take with 20 mg for a total of 60 mg    . losartan (COZAAR) 25 MG tablet Take 25 mg by mouth daily.    . metoprolol succinate (TOPROL-XL) 25 MG 24 hr tablet Take 0.5 tablets (12.5 mg total) by mouth daily. 45 tablet 1  . potassium chloride SA (KLOR-CON) 20 MEQ tablet Take 20 mEq by mouth daily.    . rosuvastatin (CRESTOR) 10 MG tablet Take 10 mg by mouth daily.    . sertraline (ZOLOFT) 50 MG tablet Take 50 mg by mouth daily.    . TRULICITY 4.5  MG/0.5ML SOPN Inject 4.5 mg into the skin once a week. Friday    . witch hazel-glycerin (TUCKS) pad Place rectally.     No current facility-administered medications on file prior to visit.    Allergies  Allergen Reactions  . Shellfish-Derived Products Hives  . Coconut Oil Hives and Rash    Family History  Problem Relation Age of Onset  . Diabetes Mother   . Hypertension Father   . Hypertension Brother   . Hypertension Maternal Grandfather   . Hypertension Paternal Grandmother     BP (!) 118/94 (BP Location: Right Arm, Patient Position: Sitting, Cuff Size: Large)   Pulse 75   Ht 5\' 11"  (1.803 m)   Wt 257 lb 6.4 oz (116.8 kg)   SpO2 97%   BMI 35.90 kg/m    Review of Systems He denies hypoglycemia/n/v.      Objective:   Physical Exam VITAL SIGNS:  See vs page GENERAL: no distress Pulses: dorsalis pedis intact bilat.    MSK: no deformity of the feet.   CV: 2+ left and 1+ right leg edema.   Skin:  no ulcer on the feet, but the skin is dry.  normal color and temp on the feet.   Neuro: sensation is intact to touch on the feet, but decreased from normal.     Lab Results  Component Value Date   HGBA1C 8.0 (A) 07/09/2020   Lab Results  Component Value Date   CREATININE 0.87 01/31/2020   BUN 15 01/31/2020   NA 141 02/06/2020   K 3.8 02/06/2020   CL 99 01/31/2020   CO2 27 01/31/2020       Assessment & Plan:  Type 2 DM: uncontrolled.    Patient Instructions  I have sent a prescription to your pharmacy, to increase the bromocriptine to 1 pill per day Please continue the same other diabetes medications. check your blood sugar once a day.  vary the time of day when you check, between before the 3 meals, and at bedtime.  also check if you have symptoms of your blood sugar being too high or too low.  please keep a record of the readings and bring it to your next appointment here (or you can bring the meter itself).  You can write it on any piece of paper.  please call 02/02/2020 sooner if your blood sugar goes below 70, or if most of your readings are over 200. Please come back for a follow-up appointment in 4 months

## 2020-07-09 NOTE — Patient Instructions (Signed)
I have sent a prescription to your pharmacy, to increase the bromocriptine to 1 pill per day Please continue the same other diabetes medications. check your blood sugar once a day.  vary the time of day when you check, between before the 3 meals, and at bedtime.  also check if you have symptoms of your blood sugar being too high or too low.  please keep a record of the readings and bring it to your next appointment here (or you can bring the meter itself).  You can write it on any piece of paper.  please call us sooner if your blood sugar goes below 70, or if most of your readings are over 200. Please come back for a follow-up appointment in 4 months

## 2020-07-09 NOTE — Telephone Encounter (Signed)
Victor [name] with [facility] requests to be called at ph# [phone] re: clarification of RX for Metformin

## 2020-07-10 NOTE — Telephone Encounter (Signed)
Spoke with Misty Stanley from the pharmacy and she wanted to know do you want the pt to continue the regular metformin or start on the extended release? The pt had just pick from the pharmacy the 1000mg  so now is he supposed to double or is he goiing to need another Rx.  Please Advise

## 2020-07-10 NOTE — Telephone Encounter (Signed)
OK to use up the regular

## 2020-07-10 NOTE — Telephone Encounter (Signed)
Spoke with Misty Stanley at the pharmacy ans she stated that she will contact pt to let him know.

## 2020-07-25 ENCOUNTER — Ambulatory Visit: Payer: Self-pay | Admitting: Vascular Surgery

## 2020-08-01 ENCOUNTER — Ambulatory Visit: Payer: Self-pay | Admitting: Vascular Surgery

## 2020-09-10 ENCOUNTER — Ambulatory Visit: Payer: 59 | Admitting: Cardiology

## 2020-10-17 ENCOUNTER — Ambulatory Visit: Payer: Self-pay | Admitting: Vascular Surgery

## 2020-11-12 ENCOUNTER — Ambulatory Visit: Payer: Self-pay | Admitting: Endocrinology

## 2021-05-12 ENCOUNTER — Telehealth: Payer: Self-pay | Admitting: Critical Care Medicine

## 2021-05-12 NOTE — Telephone Encounter (Signed)
Error

## 2021-05-13 ENCOUNTER — Other Ambulatory Visit: Payer: Self-pay

## 2021-05-13 ENCOUNTER — Ambulatory Visit: Payer: Medicaid Other | Admitting: Physician Assistant

## 2021-05-13 DIAGNOSIS — I5033 Acute on chronic diastolic (congestive) heart failure: Secondary | ICD-10-CM | POA: Diagnosis not present

## 2021-05-13 DIAGNOSIS — W57XXXA Bitten or stung by nonvenomous insect and other nonvenomous arthropods, initial encounter: Secondary | ICD-10-CM | POA: Insufficient documentation

## 2021-05-13 DIAGNOSIS — R600 Localized edema: Secondary | ICD-10-CM | POA: Diagnosis not present

## 2021-05-13 MED ORDER — FUROSEMIDE 20 MG PO TABS: 20.0000 mg | ORAL_TABLET | Freq: Every day | ORAL | 1 refills | Status: DC

## 2021-05-13 MED ORDER — HYDROXYZINE HCL 10 MG PO TABS: 10.0000 mg | ORAL_TABLET | Freq: Three times a day (TID) | ORAL | 0 refills | Status: DC | PRN

## 2021-05-13 MED ORDER — TRIAMCINOLONE ACETONIDE 0.1 % EX CREA: 1.0000 "application " | TOPICAL_CREAM | Freq: Two times a day (BID) | CUTANEOUS | 0 refills | Status: DC

## 2021-05-13 NOTE — Progress Notes (Signed)
Patient reports exposure to beg bugs due to being in a hotel. Patient reports room has been sprayed from pest control 4 times with no relief. Patient reports bites on arms legs and back. Patient last saw endo in December and has been taking medications and checking CBG randomly fasting. Patient reports CBG's between 140-160.

## 2021-05-13 NOTE — Progress Notes (Signed)
New Patient Office Visit  Subjective:  Patient ID: Victor [name], male    DOB: [birth year]  Age: 51 y.o. MRN: [id number]  CC:  Chief Complaint  Patient presents with   Insect Bite   Virtual Visit via Telephone Note  I connected with Victor [name] on 05/13/21 at  9:10 AM EST by telephone and verified that I am speaking with the correct person using two identifiers.  Location: Patient: Car  Provider: [facility]    I discussed the limitations, risks, security and privacy concerns of performing an evaluation and management service by telephone and the availability of in person appointments. I also discussed with the patient that there may be a patient responsible charge related to this service. The patient expressed understanding and agreed to proceed.   History of Present Illness:  Victor [name] reports that he recently relocated to [place] from [place], is currently staying in a hotel while he waits for his apartment to be renovated.  Reports that his hotel room has an infestation of bedbugs.  States that he has "bites all over his body".  Reports that he has had the room sprayed 3 times without relief.  Describes the bites as itchy, states that he does not scratch them.  States that he uses alcohol on them without much relief.  Denies any purulent lesions.  Denies any new medications, body washes, is exposed to new detergents due to staying in hotel.  States that he had an ankle bracelet placed by his probation officer a few days ago, states that he has had some swelling around the area where it was placed.  Does endorse history of chronic bilateral lower edema, states that he wears his compression stockings as directed.  States that he takes 60 mg of Lasix on a daily basis, however does endorse that he has been out of the 20 mg tablet for the past month.  States that he otherwise has all of his current prescriptions, states that he was previously seen by Dr. Everardo AllEllison  for his diabetes in [place] and does plan on contacting them to set up an office visit.  States that [facility].  States that he was last seen by endocrinology in [place] in December 2022.    Observations/Objective: Medical history and current medications reviewed, no physical exam completed   Past Medical History:  Diagnosis Date   Acute on chronic congestive heart failure (HCC)    Arthritis    Gout    Hay fever    High cholesterol    Hypertension    Mental health problem    Near syncope 12/27/2019   Pre-syncope 12/27/2019   Shortness of breath 02/06/2020   Syncope 12/26/2019   Type 2 diabetes mellitus without complication, without long-term current use of insulin (HCC)    Unstable angina (HCC) 02/06/2020    Past Surgical History:  Procedure Laterality Date   EYE SURGERY     HEMORROIDECTOMY     RIGHT/LEFT HEART CATH AND CORONARY ANGIOGRAPHY N/A 02/06/2020   Procedure: RIGHT/LEFT HEART CATH AND CORONARY ANGIOGRAPHY;  Surgeon: Marykay LexHarding, David W, MD;  Location: [facility] INVASIVE CV LAB;  Service: Cardiovascular;  Laterality: N/A;   WISDOM TOOTH EXTRACTION      Family History  Problem Relation Age of Onset   Diabetes Mother    Hypertension Father    Hypertension Brother    Hypertension Maternal Grandfather  Hypertension Paternal Grandmother     Social History   Socioeconomic History   Marital status: Divorced    Spouse name: Not on file   Number of children: Not on file   Years of education: Not on file   Highest education level: Not on file  Occupational History   Not on file  Tobacco Use   Smoking status: Former    Types: Cigarettes    Quit date: 04/20/2000    Years since quitting: 21.0   Smokeless tobacco: Never  Vaping Use   Vaping Use: Never used  Substance and Sexual Activity   Alcohol use: Never   Drug use: Never   Sexual activity: Not Currently  Other Topics Concern    Not on file  Social History Narrative   Works 3rd shift    Social Determinants of Health   Financial Resource Strain: Not on file  Food Insecurity: Not on file  Transportation Needs: Not on file  Physical Activity: Not on file  Stress: Not on file  Social Connections: Not on file  Intimate Partner Violence: Not on file    ROS Review of Systems  Constitutional:  Negative for chills and fever.  HENT: Negative.    Eyes: Negative.   Respiratory:  Negative for cough and shortness of breath.   Cardiovascular:  Negative for chest pain.  Gastrointestinal: Negative.   Endocrine: Negative.   Genitourinary: Negative.   Musculoskeletal: Negative.   Skin:  Positive for rash.  Allergic/Immunologic: Negative.   Neurological:  Negative for dizziness, weakness and headaches.  Hematological: Negative.   Psychiatric/Behavioral: Negative.     Objective:   Today's Vitals: There were no vitals taken for this visit.    Assessment & Plan:   Problem List Items Addressed This Visit       Cardiovascular and Mediastinum   Acute on chronic congestive heart failure (HCC)   Relevant Medications   furosemide (LASIX) 20 MG tablet     Other   Bedbug bite - Primary   Relevant Medications   triamcinolone cream (KENALOG) 0.1 %   hydrOXYzine (ATARAX) 10 MG tablet   Localized edema    Outpatient Encounter Medications as of 05/13/2021  Medication Sig   ACCU-CHEK GUIDE test strip 1 each by Other route 2 (two) times daily. as directed   Accu-Chek Softclix Lancets lancets 1 each by Other route in the morning and at bedtime.   acetaminophen (TYLENOL) 325 MG tablet Take 2 tablets (650 mg total) by mouth every 6 (six) hours as needed for mild pain (or Fever >/= 101).   canagliflozin (INVOKANA) 300 MG TABS tablet Take 1 tablet (300 mg total) by mouth daily before breakfast.   gabapentin (NEURONTIN) 300 MG capsule Take 300 mg by mouth every evening.   glipiZIDE (GLUCOTROL) 5 MG tablet Take 1 tablet (5  mg total) by mouth daily before breakfast.   hydrOXYzine (ATARAX) 10 MG tablet Take 1 tablet (10 mg total) by mouth 3 (three) times daily as needed.   metFORMIN (GLUCOPHAGE-XR) 500 MG 24 hr tablet Take 4 tablets (2,000 mg total) by mouth daily.   potassium chloride SA (KLOR-CON) 20 MEQ tablet Take 20 mEq by mouth daily.   sertraline (ZOLOFT) 50 MG tablet Take 50 mg by mouth daily.   triamcinolone cream (KENALOG) 0.1 % Apply 1 application topically 2 (two) times daily.   TRULICITY 4.5 MG/0.5ML SOPN Inject 4.5 mg into the skin once a week. Friday   witch hazel-glycerin (TUCKS) pad Place rectally.   [  DISCONTINUED] furosemide (LASIX) 20 MG tablet Take 20 mg by mouth daily. Take with 40 mg for a total of 60 mg   bromocriptine (PARLODEL) 2.5 MG tablet Take 1 tablet (2.5 mg total) by mouth at bedtime. (Patient not taking: Reported on 05/13/2021)   furosemide (LASIX) 20 MG tablet Take 1 tablet (20 mg total) by mouth daily. Take with 40 mg for a total of 60 mg   furosemide (LASIX) 40 MG tablet Take 40 mg by mouth daily. Take with 20 mg for a total of 60 mg   JARDIANCE 10 MG TABS tablet Take 10 mg by mouth every morning.   losartan (COZAAR) 25 MG tablet Take 25 mg by mouth daily. (Patient not taking: Reported on 05/13/2021)   metoprolol succinate (TOPROL-XL) 25 MG 24 hr tablet Take 0.5 tablets (12.5 mg total) by mouth daily. (Patient not taking: Reported on 05/13/2021)   rosuvastatin (CRESTOR) 10 MG tablet Take 10 mg by mouth daily. (Patient not taking: Reported on 05/13/2021)   No facility-administered encounter medications on file as of 05/13/2021.    Assessment and Plan: 1. Bedbug bite, initial encounter Trial Kenalog, hydroxyzine.  Patient education given on supportive care.  Red flags for prompt reevaluation.  Patient has upcoming [facility], patient to make appointment to reestablish with endocrinology, was last seen by  endocrinology in [facility] in March 2022.  Was last seen by endocrinology in [place] in December 2022.  Patient encouraged to return to mobile unit if needed.  Patient understands and agrees - triamcinolone cream (KENALOG) 0.1 %; Apply 1 application topically 2 (two) times daily.  Dispense: 80 g; Refill: 0 - hydrOXYzine (ATARAX) 10 MG tablet; Take 1 tablet (10 mg total) by mouth 3 (three) times daily as needed.  Dispense: 30 tablet; Refill: 0  2. Localized edema Patient encouraged to contact probation officer for adjustment of ankle bracelet.  Red flags given for prompt reevaluation  3. Acute on chronic diastolic congestive heart failure (HCC) Refill Lasix, patient education given on supportive care - furosemide (LASIX) 20 MG tablet; Take 1 tablet (20 mg total) by mouth daily. Take with 40 mg for a total of 60 mg  Dispense: 30 tablet; Refill: 1   Follow Up Instructions:    I discussed the assessment and treatment plan with the patient. The patient was provided an opportunity to ask questions and all were answered. The patient agreed with the plan and demonstrated an understanding of the instructions.   The patient was advised to call back or seek an in-person evaluation if the symptoms worsen or if the condition fails to improve as anticipated.  I provided 21 minutes of non-face-to-face time during this encounter.   Kasandra Knudsen Mayers, PA-C

## 2021-05-13 NOTE — Patient Instructions (Signed)
To help with your bites, you are going to use the cream prescribed twice a day until resolved, you can also use the hydroxyzine to help you with the itching.  I sent a refill of your Lasix 20 mg to your pharmacy.  I do encourage you to resume that along with your 40 mg tablet.  I encourage you to continue reaching out to your probation officer to discuss adjusting the ankle bracelet.  Please let us know if there is anything else we can do for you.  Kennieth Rad, PA-C Physician Assistant [place] [email or web address]   Insect Bite, Adult An insect bite can make your skin red, itchy, and swollen. An insect bite is different from an insect sting, which happens when an insect injects poison (venom) into the skin. Some insects can spread disease to people through a bite. However, most insect bites do not lead to disease and are not serious. What are the causes? Insects may bite for a variety of reasons, including: Hunger. To defend themselves. Insects that bite include: Spiders. Mosquitoes. Ticks. Fleas. Ants. Flies. Kissing bugs. Chiggers. What are the signs or symptoms? Symptoms of this condition include: Itching or pain in the bite area. Redness and swelling in the bite area. An open wound (skin ulcer). In many cases, symptoms last for 2-4 days. In rare cases, a person may have a severe allergic reaction (anaphylactic reaction) to a bite. Symptoms of an anaphylactic reaction may include: Feeling warm in the face (flushed). This may include redness. Itchy, red, swollen areas of skin (hives). Swelling of the eyes, lips, face, mouth, tongue, or throat. Difficulty breathing, speaking, or swallowing. Noisy breathing (wheezing). Dizziness or light-headedness. Fainting. Pain or cramping in the abdomen. Vomiting. Diarrhea. How is this diagnosed? This condition is usually diagnosed based on symptoms and a physical  exam. How is this treated? Treatment is usually not needed. Symptoms often go away on their own. When treatment is recommended, it may involve: Applying a cream or lotion to the bite area. This treatment helps with itching. Taking an antibiotic medicine. This treatment is needed if the bite area gets infected. Getting a tetanus shot, if you are not up to date on this vaccine. Applying ice to the affected area. Allergy medicines called antihistamines. This treatment may be needed if you develop itching or an allergic reaction to the insect bite. Giving yourself an epinephrine injection if you have an anaphylactic reaction to a bite. To give the injection, you will use what is commonly called an auto-injector "pen" (pre-filled automatic epinephrine injection device). Your health care provider will teach you how to use an auto-injector pen. Follow these instructions at home: Bite area care  Do not scratch the bite area. Keep the bite area clean and dry. Wash it every day with soap and water as told by your health care provider. Check the bite area every day for signs of infection. Check for: Redness, swelling, or pain. Fluid or blood. Warmth. Pus or a bad smell. Managing pain, itching, and swelling  You may apply cortisone cream, calamine lotion, or a paste made of baking soda and water to the bite area as told by your health care provider. If directed, put ice on the bite area. Put ice in a plastic bag. Place a towel between your skin and the bag. Leave the ice on for 20 minutes, 2-3 times a day. General instructions Apply or take over-the-counter and prescription medicines only as told by your health care provider.  If you were prescribed an antibiotic medicine, take or apply it as told by your health care provider. Do not stop using the antibiotic even if your condition improves. Keep all follow-up visits as told by your health care provider. This is important. How is this prevented? To  help reduce your risk of insect bites: When you are outdoors, wear clothing that covers your arms and legs. This is especially important in the early morning and evening. Use insect repellent. The best insect repellents contain DEET, picaridin, oil of lemon eucalyptus (OLE), or IR3535. Consider spraying your clothing with a pesticide called permethrin. Permethrin helps prevent insect bites. It works for several weeks and for up to 5-6 clothing washes. Do not apply permethrin directly to the skin. If your home windows do not have screens, consider installing them. If you will be sleeping in an area where there are mosquitoes, consider covering your sleeping area with a mosquito net. Contact a health care provider if: You have redness, swelling, or pain in the bite area. You have fluid or blood coming from the bite area. The bite area feels warm to the touch. You have pus or a bad smell coming from the bite area. You have a fever. Get help right away if: You have joint pain. You have a rash. You feel unusually tired or sleepy. You have neck pain. You have a headache. You have unusual weakness. You develop symptoms of an anaphylactic reaction. These may include: Flushed skin. Hives. Swelling of the eyes, lips, face, mouth, tongue, or throat. Difficulty breathing, speaking, or swallowing. Wheezing. Dizziness or light-headedness. Fainting. Pain or cramping in the abdomen. Vomiting. Diarrhea. These symptoms may represent a serious problem that is an emergency. Do not wait to see if the symptoms will go away. Do the following right away: Use the auto-injector pen as you have been instructed. Get medical help. Call your local emergency services (911 in the [place] Do not drive yourself to the hospital. Summary An insect bite can make your skin red, itchy, and swollen. Treatment is usually not needed. Symptoms often go away on their own. When treatment is recommended, it may involve taking  medicine, applying medicine to the area, or applying ice. Apply or take over-the-counter and prescription medicines only as told by your health care provider. Use insect repellent to help prevent insect bites. Contact a health care provider if you have any signs of infection in the bite area. This information is not intended to replace advice given to you by your health care provider. Make sure you discuss any questions you have with your health care provider. Document Revised: 10/14/2017 Document Reviewed: 10/15/2017 Elsevier Patient Education  [place].

## 2021-05-16 ENCOUNTER — Other Ambulatory Visit: Payer: Self-pay

## 2021-05-16 ENCOUNTER — Ambulatory Visit (INDEPENDENT_AMBULATORY_CARE_PROVIDER_SITE_OTHER): Payer: Medicaid Other | Admitting: Endocrinology

## 2021-05-16 ENCOUNTER — Telehealth: Payer: Self-pay | Admitting: Endocrinology

## 2021-05-16 VITALS — BP 110/70 | HR 69 | Ht 71.0 in | Wt 255.6 lb

## 2021-05-16 DIAGNOSIS — E119 Type 2 diabetes mellitus without complications: Secondary | ICD-10-CM | POA: Diagnosis not present

## 2021-05-16 LAB — T4, FREE: Free T4: 0.72 ng/dL (ref 0.60–1.60)

## 2021-05-16 LAB — BASIC METABOLIC PANEL
BUN: 17 mg/dL (ref 6–23)
CO2: 27 mEq/L (ref 19–32)
Calcium: 9.9 mg/dL (ref 8.4–10.5)
Chloride: 104 mEq/L (ref 96–112)
Creatinine, Ser: 0.95 mg/dL (ref 0.40–1.50)
GFR: 92.44 mL/min (ref >60.00)
Glucose, Bld: 184 mg/dL — ABNORMAL HIGH (ref 70–99)
Potassium: 4.4 mEq/L (ref 3.5–5.1)
Sodium: 138 mEq/L (ref 135–145)

## 2021-05-16 LAB — POCT GLYCOSYLATED HEMOGLOBIN (HGB A1C): Hemoglobin A1C: 8.6 % — AB (ref 4.0–5.6)

## 2021-05-16 LAB — TSH: TSH: 2.24 u[IU]/mL (ref 0.35–5.50)

## 2021-05-16 MED ORDER — METFORMIN HCL ER 500 MG PO TB24: 1000.0000 mg | ORAL_TABLET | Freq: Every day | ORAL | 3 refills | Status: DC

## 2021-05-16 MED ORDER — TRULICITY 4.5 MG/0.5ML ~~LOC~~ SOAJ: 4.5000 mg | SUBCUTANEOUS | 3 refills | Status: DC

## 2021-05-16 MED ORDER — EMPAGLIFLOZIN 25 MG PO TABS: 25.0000 mg | ORAL_TABLET | Freq: Every day | ORAL | 3 refills | Status: DC

## 2021-05-16 NOTE — Telephone Encounter (Signed)
Patient needs approval to be sent to  [id number] [place], [place] - [address] [facility] [address] Phone:  [phone]  Fax:  [phone]    For TRULICITY 4.5 MG/0.5ML SOPN  Please provide confirmation to patient [id number]

## 2021-05-16 NOTE — Progress Notes (Signed)
Subjective:    Patient ID: Victor [name], male    DOB: 11/13/1970, 51 y.o.   MRN: [id number]  HPI Pt returns for f/u of diabetes mellitus: DM type: 2 Dx'ed: 2012 Complications: none Therapy: Trulicity and 5 oral meds.   DKA: never Severe hypoglycemia: never.   Pancreatitis: never Pancreatic imaging: normal on 2021 CT.   SDOH: he is disabled; he is on parole.  Other: he never been on insulin.   Interval history: Pt says cbg's vary from 130-150.  He takes meds meds as rx'ed.  He decreased metformin, due to nausea.  He took oral steroids 1 month ago.   Past Medical History:  Diagnosis Date   Acute on chronic congestive heart failure (HCC)    Arthritis    Gout    Hay fever    High cholesterol    Hypertension    Mental health problem    Near syncope 12/27/2019   Pre-syncope 12/27/2019   Shortness of breath 02/06/2020   Syncope 12/26/2019   Type 2 diabetes mellitus without complication, without long-term current use of insulin (HCC)    Unstable angina (HCC) 02/06/2020    Past Surgical History:  Procedure Laterality Date   EYE SURGERY     HEMORROIDECTOMY     RIGHT/LEFT HEART CATH AND CORONARY ANGIOGRAPHY N/A 02/06/2020   Procedure: RIGHT/LEFT HEART CATH AND CORONARY ANGIOGRAPHY;  Surgeon: Marykay Lex, MD;  Location: [facility] INVASIVE CV LAB;  Service: Cardiovascular;  Laterality: N/A;   WISDOM TOOTH EXTRACTION      Social History   Socioeconomic History   Marital status: Divorced    Spouse name: Not on file   Number of children: Not on file   Years of education: Not on file   Highest education level: Not on file  Occupational History   Not on file  Tobacco Use   Smoking status: Former    Types: Cigarettes    Quit date: 04/20/2000    Years since quitting: 21.0   Smokeless tobacco: Never  Vaping Use   Vaping Use: Never used  Substance and Sexual Activity   Alcohol use: Never   Drug use: Never   Sexual activity: Not Currently  Other Topics Concern   Not on file   Social History Narrative   Works 3rd shift    Social Determinants of Corporate investment banker Strain: Not on file  Food Insecurity: Not on file  Transportation Needs: Not on file  Physical Activity: Not on file  Stress: Not on file  Social Connections: Not on file  Intimate Partner Violence: Not on file    Current Outpatient Medications on File Prior to Visit  Medication Sig Dispense Refill   ACCU-CHEK GUIDE test strip 1 each by Other route 2 (two) times daily. as directed     Accu-Chek Softclix Lancets lancets 1 each by Other route in the morning and at bedtime.     acetaminophen (TYLENOL) 325 MG tablet Take 2 tablets (650 mg total) by mouth every 6 (six) hours as needed for mild pain (or Fever >/= 101).     bromocriptine (PARLODEL) 2.5 MG tablet Take 1 tablet (2.5 mg total) by mouth at bedtime. 90 tablet 3   furosemide (LASIX) 20 MG tablet Take 1 tablet (20 mg total) by mouth daily. Take with 40 mg for a total of 60 mg 30 tablet 1   furosemide (LASIX) 40 MG tablet Take 40 mg by mouth daily. Take with 20 mg for a total of  60 mg     gabapentin (NEURONTIN) 300 MG capsule Take 300 mg by mouth every evening.     glipiZIDE (GLUCOTROL) 5 MG tablet Take 1 tablet (5 mg total) by mouth daily before breakfast. 90 tablet 3   hydrOXYzine (ATARAX) 10 MG tablet Take 1 tablet (10 mg total) by mouth 3 (three) times daily as needed. 30 tablet 0   losartan (COZAAR) 25 MG tablet Take 25 mg by mouth daily.     metoprolol succinate (TOPROL-XL) 25 MG 24 hr tablet Take 0.5 tablets (12.5 mg total) by mouth daily. 45 tablet 1   potassium chloride SA (KLOR-CON) 20 MEQ tablet Take 20 mEq by mouth daily.     rosuvastatin (CRESTOR) 10 MG tablet Take 10 mg by mouth daily.     sertraline (ZOLOFT) 50 MG tablet Take 50 mg by mouth daily.     triamcinolone cream (KENALOG) 0.1 % Apply 1 application topically 2 (two) times daily. 80 g 0   witch hazel-glycerin (TUCKS) pad Place rectally.     No current  facility-administered medications on file prior to visit.    Allergies  Allergen Reactions   Shellfish-Derived Products Hives   Coconut Oil Hives and Rash    Family History  Problem Relation Age of Onset   Diabetes Mother    Hypertension Father    Hypertension Brother    Hypertension Maternal Grandfather    Hypertension Paternal Grandmother     BP 110/70    Pulse 69    Ht 5\' 11"  (1.803 m)    Wt 255 lb 9.6 oz (115.9 kg)    SpO2 97%    BMI 35.65 kg/m    Review of Systems     Objective:   Physical Exam RLE: he cannot remove sock, due to ankle bracelet.  Pt says he has bilat leg edema.     Lab Results  Component Value Date   HGBA1C 8.6 (A) 05/16/2021      Assessment & Plan:  Type 2 DM: uncontrolled.   Steroid rx 1 month ago: check fructosamine, to assess more recent glycemic control  Patient Instructions  Please continue the same diabetes medications for now.   Blood tests are requested for you today.  We'll let you know about the results.   Please see your primary care provider, at [facility].   check your blood sugar once a day.  vary the time of day when you check, between before the 3 meals, and at bedtime.  also check if you have symptoms of your blood sugar being too high or too low.  please keep a record of the readings and bring it to your next appointment here (or you can bring the meter itself).  You can write it on any piece of paper.  please call [facility] sooner if your blood sugar goes below 70, or if most of your readings are over 200.   Please come back for a follow-up appointment in 2 months.

## 2021-05-16 NOTE — Patient Instructions (Addendum)
Please continue the same diabetes medications for now.   Blood tests are requested for you today.  We'll let you know about the results.   Please see your primary care provider, at [facility].   check your blood sugar once a day.  vary the time of day when you check, between before the 3 meals, and at bedtime.  also check if you have symptoms of your blood sugar being too high or too low.  please keep a record of the readings and bring it to your next appointment here (or you can bring the meter itself).  You can write it on any piece of paper.  please call us sooner if your blood sugar goes below 70, or if most of your readings are over 200.   Please come back for a follow-up appointment in 2 months.

## 2021-05-20 LAB — FRUCTOSAMINE: Fructosamine: 353 umol/L — ABNORMAL HIGH (ref 205–285)

## 2021-05-26 ENCOUNTER — Telehealth: Payer: Self-pay

## 2021-05-26 NOTE — Telephone Encounter (Signed)
Spoke with pt regarding needing a PA for Jardiance. She stated that the pharmacy stated that she would prior approval for next refill. She has some now to carry her over.

## 2021-05-27 ENCOUNTER — Emergency Department (HOSPITAL_COMMUNITY): Payer: Medicaid Other

## 2021-05-27 ENCOUNTER — Emergency Department (HOSPITAL_COMMUNITY)
Admission: EM | Admit: 2021-05-27 | Discharge: 2021-05-28 | Disposition: A | Discharge status: Home / Self Care | Payer: Medicaid Other | Attending: Emergency Medicine | Admitting: Emergency Medicine

## 2021-05-27 ENCOUNTER — Other Ambulatory Visit: Payer: Self-pay

## 2021-05-27 DIAGNOSIS — I110 Hypertensive heart disease with heart failure: Secondary | ICD-10-CM | POA: Insufficient documentation

## 2021-05-27 DIAGNOSIS — M545 Low back pain, unspecified: Secondary | ICD-10-CM | POA: Insufficient documentation

## 2021-05-27 DIAGNOSIS — R600 Localized edema: Secondary | ICD-10-CM | POA: Diagnosis not present

## 2021-05-27 DIAGNOSIS — E119 Type 2 diabetes mellitus without complications: Secondary | ICD-10-CM | POA: Insufficient documentation

## 2021-05-27 DIAGNOSIS — Z7984 Long term (current) use of oral hypoglycemic drugs: Secondary | ICD-10-CM | POA: Insufficient documentation

## 2021-05-27 DIAGNOSIS — Z79899 Other long term (current) drug therapy: Secondary | ICD-10-CM | POA: Insufficient documentation

## 2021-05-27 DIAGNOSIS — M79605 Pain in left leg: Secondary | ICD-10-CM | POA: Insufficient documentation

## 2021-05-27 DIAGNOSIS — I509 Heart failure, unspecified: Secondary | ICD-10-CM | POA: Insufficient documentation

## 2021-05-27 DIAGNOSIS — M533 Sacrococcygeal disorders, not elsewhere classified: Secondary | ICD-10-CM | POA: Insufficient documentation

## 2021-05-27 MED ORDER — ACETAMINOPHEN 500 MG PO TABS
1000.0000 mg | ORAL_TABLET | Freq: Once | ORAL | Status: AC
  Administered 2021-05-27: 1000 mg via ORAL
  Filled 2021-05-27: qty 2

## 2021-05-27 NOTE — ED Provider Triage Note (Signed)
Emergency Medicine Provider Triage Evaluation Note  Victor [name] , a 51 y.o. male  was evaluated in triage.  Pt complains of pain to left lower extremity.  Pain has been present for the last 3 days.  Patient describes pain as "aching and like rubber bands around my leg."  Pain is worse with standing and ambulation.  Patient has not tried any modalities today to alleviate his symptoms.  Patient denies any recent falls or injuries.  Review of Systems  Positive: Left lower extremity pain Negative: Numbness, weakness, color change, wound  Physical Exam  BP (!) 118/98 (BP Location: Right Arm)    Pulse 76    Temp 98.3 F (36.8 C) (Oral)    Resp 16    Ht 5\' 11"  (1.803 m)    Wt 115.7 kg    SpO2 100%    BMI 35.57 kg/m  Gen:   Awake, no distress   Resp:  Normal effort  MSK:   Moves extremities without difficulty Other:  +2 left DP pulse.  No swelling or tenderness to bilateral lower extremities.  Tenderness to left anterior thigh.  Medical Decision Making  Medically screening exam initiated at 9:21 PM.  Appropriate orders placed.  was informed that the remainder of the evaluation will be completed by another provider, this initial triage assessment does not replace that evaluation, and the importance of remaining in the ED until their evaluation is complete.     [name], Haskel Schroeder 05/27/21 2123

## 2021-05-27 NOTE — ED Triage Notes (Signed)
Pt c/o left leg from his thigh and radiating down his leg. Pt reports it has been aching for the past 3 days.

## 2021-05-28 ENCOUNTER — Emergency Department (HOSPITAL_BASED_OUTPATIENT_CLINIC_OR_DEPARTMENT_OTHER): Payer: Medicaid Other

## 2021-05-28 DIAGNOSIS — M7989 Other specified soft tissue disorders: Secondary | ICD-10-CM

## 2021-05-28 MED ORDER — METHOCARBAMOL 500 MG PO TABS: 500.0000 mg | ORAL_TABLET | Freq: Four times a day (QID) | ORAL | 0 refills | Status: DC

## 2021-05-28 MED ORDER — METHOCARBAMOL 500 MG PO TABS
500.0000 mg | ORAL_TABLET | Freq: Once | ORAL | Status: AC
  Administered 2021-05-28: 500 mg via ORAL
  Filled 2021-05-28: qty 1

## 2021-05-28 NOTE — Discharge Instructions (Signed)
Please read and follow all provided instructions.  Your diagnoses today include:  1. Left leg pain     Tests performed today include: An x-ray of the affected area - does NOT show any broken bones Ultrasound of your leg - did not show any blood clots Vital signs. See below for your results today.   Medications prescribed:  Robaxin (methocarbamol) - muscle relaxer medication  DO NOT drive or perform any activities that require you to be awake and alert because this medicine can make you drowsy.   Take any prescribed medications only as directed.  Home care instructions:  Follow any educational materials contained in this packet Follow R.I.C.E. Protocol: R - rest your injury  I  - use ice on injury without applying directly to skin C - compress injury with bandage or splint E - elevate the injury as much as possible  Follow-up instructions: Please follow-up with your primary care provider in 3-5 days for recheck if not improving.   Return instructions:  Please return if your toes or feet are numb or tingling, appear gray or blue, or you have severe pain (also elevate the leg and loosen splint or wrap if you were given one) Please return to the Emergency Department if you experience worsening symptoms.  Please return if you have any other emergent concerns.  Additional Information:  Your vital signs today were: BP (!) 133/95 (BP Location: Right Arm)    Pulse (!) 56    Temp 98.3 F (36.8 C) (Oral)    Resp 18    Ht 5\' 11"  (1.803 m)    Wt 115.7 kg    SpO2 99%    BMI 35.57 kg/m  If your blood pressure (BP) was elevated above 135/85 this visit, please have this repeated by your doctor within one month. --------------

## 2021-05-28 NOTE — ED Provider Notes (Signed)
[facility] EMERGENCY DEPARTMENT Provider Note   CSN: [id number] Arrival date & time: 05/27/21  2011     History  Chief Complaint  Patient presents with   Leg Pain    Victor [name] is a 51 y.o. male.  Patient with history of diabetes, congestive heart failure, chronic lower extremity edema, hypertension --presents to the emergency department for evaluation of left leg pain.  This has been ongoing for about 3 days.  He describes it as a tight "bandlike" feeling in his left lower thigh.  He has a history of bulging disks in his back but states that this feels different than his usual symptoms.  Swelling seems to be at baseline.  He does have some mild pain in the lower back. Patient denies warning symptoms of back pain including: fecal incontinence, urinary retention or overflow incontinence, night sweats, waking from sleep with back pain, unexplained fevers or weight loss, h/o cancer, IVDU, recent trauma.   Patient denies history of blood clots.  No treatments prior to arrival.       Home Medications Prior to Admission medications   Medication Sig Start Date End Date Taking? Authorizing Provider  ACCU-CHEK GUIDE test strip 1 each by Other route 2 (two) times daily. as directed 01/15/21   [provider]  Accu-Chek Softclix Lancets lancets 1 each by Other route in the morning and at bedtime. 01/15/21   [provider]  acetaminophen (TYLENOL) 325 MG tablet Take 2 tablets (650 mg total) by mouth every 6 (six) hours as needed for mild pain (or Fever >/= 101). 12/28/19   Maury Dus, MD  bromocriptine (PARLODEL) 2.5 MG tablet Take 1 tablet (2.5 mg total) by mouth at bedtime. 07/09/20   Romero Belling, MD  empagliflozin (JARDIANCE) 25 MG TABS tablet Take 1 tablet (25 mg total) by mouth daily before breakfast. 05/16/21   Romero Belling, MD  furosemide (LASIX) 20 MG tablet Take 1 tablet (20 mg total) by mouth daily. Take with 40 mg for a total of 60 mg 05/13/21    Mayers, Cari S, PA-C  furosemide (LASIX) 40 MG tablet Take 40 mg by mouth daily. Take with 20 mg for a total of 60 mg 01/10/20   [provider]  gabapentin (NEURONTIN) 300 MG capsule Take 300 mg by mouth every evening. 12/22/20   [provider]  glipiZIDE (GLUCOTROL) 5 MG tablet Take 1 tablet (5 mg total) by mouth daily before breakfast. 07/09/20   Romero Belling, MD  hydrOXYzine (ATARAX) 10 MG tablet Take 1 tablet (10 mg total) by mouth 3 (three) times daily as needed. 05/13/21   Mayers, Cari S, PA-C  losartan (COZAAR) 25 MG tablet Take 25 mg by mouth daily. 01/12/20   [provider]  metFORMIN (GLUCOPHAGE-XR) 500 MG 24 hr tablet Take 2 tablets (1,000 mg total) by mouth daily. 05/16/21   Romero Belling, MD  metoprolol succinate (TOPROL-XL) 25 MG 24 hr tablet Take 0.5 tablets (12.5 mg total) by mouth daily. 02/19/20   Tobb, Kardie, DO  potassium chloride SA (KLOR-CON) 20 MEQ tablet Take 20 mEq by mouth daily.    [provider]  rosuvastatin (CRESTOR) 10 MG tablet Take 10 mg by mouth daily.    [provider]  sertraline (ZOLOFT) 50 MG tablet Take 50 mg by mouth daily.    [provider]  triamcinolone cream (KENALOG) 0.1 % Apply 1 application topically 2 (two) times daily. 05/13/21   Mayers, Cari S, PA-C  TRULICITY 4.5  MG/0.5ML SOPN Inject 4.5 mg into the skin once a week. 05/16/21   Romero Belling, MD  witch hazel-glycerin (TUCKS) pad Place rectally. 03/05/20   [provider]      Allergies    Shellfish-derived products and Coconut oil    Review of Systems   Review of Systems  Physical Exam Updated Vital Signs BP (!) 133/95 (BP Location: Right Arm)    Pulse (!) 56    Temp 98.3 F (36.8 C) (Oral)    Resp 18    Ht 5\' 11"  (1.803 m)    Wt 115.7 kg    SpO2 99%    BMI 35.57 kg/m  Physical Exam Vitals and nursing note reviewed.  Constitutional:      Appearance: He is well-developed.  HENT:     Head: Normocephalic and atraumatic.  Eyes:      Conjunctiva/sclera: Conjunctivae normal.  Abdominal:     Palpations: Abdomen is soft.     Tenderness: There is no abdominal tenderness. There is no right CVA tenderness or left CVA tenderness.  Musculoskeletal:        General: Tenderness present. Normal range of motion.     Cervical back: Normal range of motion.     Right lower leg: Edema present.     Left lower leg: Edema present.     Comments: No step-off noted with palpation of spine.  Mild left lumbar and sacral paraspinous tenderness.    Bilateral lower extremity edema, 1+ to the mid ankles, appears to be symmetric.  Patient has an ankle bracelet on the right ankle.  No severe tenderness to palpation of the left or right thigh, no cellulitis, warmth, redness.  2+ pedal pulses bilaterally.  Skin:    General: Skin is warm and dry.  Neurological:     Mental Status: He is alert.     Sensory: No sensory deficit.     Motor: No abnormal muscle tone.     Deep Tendon Reflexes: Reflexes are normal and symmetric.     Comments: 5/5 strength in entire lower extremities bilaterally. No sensation deficit.   Psychiatric:        Mood and Affect: Mood normal.    ED Results / Procedures / Treatments   Labs (all labs ordered are listed, but only abnormal results are displayed) Labs Reviewed - No data to display  EKG None  Radiology DG Hip Unilat W or Wo Pelvis 2-3 Views Left  Result Date: 05/27/2021 CLINICAL DATA:  Left lower extremity pain for 3 days EXAM: DG HIP (WITH OR WITHOUT PELVIS) 2-3V LEFT COMPARISON:  None. FINDINGS: Degenerative changes in the lower lumbar spine and hips. No evidence of acute fracture or dislocation. No focal bone lesion or bone destruction. Visualized sacrum appears intact. IMPRESSION: Degenerative changes in the hips.  No acute bony abnormalities. Electronically Signed   By: 07/25/2021 M.D.   On: 05/27/2021 21:59    Procedures Procedures    Medications Ordered in ED Medications  acetaminophen  (TYLENOL) tablet 1,000 mg (1,000 mg Oral Given 05/27/21 2125)  methocarbamol (ROBAXIN) tablet 500 mg (500 mg Oral Given 05/28/21 07/26/21)    ED Course/ Medical Decision Making/ A&P    Patient seen and examined. History obtained directly from patient. Work-up including labs, imaging, EKG ordered in triage, if performed, were reviewed.    Labs/EKG: Independently reviewed and interpreted.  This included: None ordered  Imaging: Independently reviewed and interpreted.  This included: X-ray of the left hip, agree no fracture  Medications/Fluids: Ordered: P.o. Robaxin.   Most recent vital signs reviewed and are as follows: BP (!) 133/95 (BP Location: Right Arm)    Pulse (!) 56    Temp 98.3 F (36.8 C) (Oral)    Resp 18    Ht 5\' 11"  (1.803 m)    Wt 115.7 kg    SpO2 99%    BMI 35.57 kg/m   Initial impression: Left lower extremity pain, possible radiculopathy or MSK pain, would like to rule out DVT.  Patient agrees to stay for ultrasound.  This was ordered.  10:34 AM Reassessment performed. Patient appears comfortable.    Reviewed additional pertinent lab work and imaging with patient at bedside including: DVT ultrasound, negative.  Most current vital signs reviewed and are as follows: BP (!) 133/95 (BP Location: Right Arm)    Pulse (!) 56    Temp 98.3 F (36.8 C) (Oral)    Resp 18    Ht 5\' 11"  (1.803 m)    Wt 115.7 kg    SpO2 99%    BMI 35.57 kg/m   Plan: Discharge to home  Home treatment: Prescription written for Robaxin. Encouraged used of OTC Tylenol as directed on the packaging.   Patient counseled on proper use of muscle relaxant medication.  They were told not to drink alcohol, drive any vehicle, or do any dangerous activities while taking this medication.  Patient verbalized understanding.  Return and follow-up instructions: Encouraged return to ED with worsening pain, redness, swelling, warmth of the leg. Encouraged patient to follow-up with their provider in 3-5 days. Patient  verbalized understanding and agreed with plan.                               Medical Decision Making Amount and/or Complexity of Data Reviewed ECG/medicine tests: ordered.  Risk Prescription drug management.    Patient with left lower extremity pain.  Considered DVT, DVT study negative.  Considered bony injury to the hip, x-ray negative.  No significant swelling, redness, warmth to suggest abscess or cellulitis.  Suspect musculoskeletal pain or possible radiculopathy.  Patient looks well.  At this point, symptomatic care indicated with PCP follow-up for additional treatment or monitoring.          Final Clinical Impression(s) / ED Diagnoses Final diagnoses:  Left leg pain    Rx / DC Orders ED Discharge Orders          Ordered    methocarbamol (ROBAXIN) 500 MG tablet  4 times daily        05/28/21 1032              Renne Crigler, PA-C 05/28/21 1036    Edwin Dada P, DO 05/28/21 1208

## 2021-05-28 NOTE — Progress Notes (Signed)
Lower extremity venous LT study completed.  Preliminary results relayed to [place], [place]. Patient returned to ED waiting room.  See CV Proc for preliminary results report.   Jean Rosenthal, RDMS, RVT

## 2021-06-04 ENCOUNTER — Other Ambulatory Visit: Payer: Self-pay | Admitting: Physician Assistant

## 2021-06-04 DIAGNOSIS — I5033 Acute on chronic diastolic (congestive) heart failure: Secondary | ICD-10-CM

## 2021-06-05 ENCOUNTER — Encounter: Payer: Self-pay | Admitting: Family Medicine

## 2021-06-05 ENCOUNTER — Other Ambulatory Visit: Payer: Self-pay

## 2021-06-05 ENCOUNTER — Ambulatory Visit: Payer: Medicaid Other | Attending: Family Medicine | Admitting: Family Medicine

## 2021-06-05 VITALS — BP 142/84 | HR 87 | Ht 71.0 in | Wt 261.0 lb

## 2021-06-05 DIAGNOSIS — E1149 Type 2 diabetes mellitus with other diabetic neurological complication: Secondary | ICD-10-CM

## 2021-06-05 DIAGNOSIS — G5622 Lesion of ulnar nerve, left upper limb: Secondary | ICD-10-CM | POA: Diagnosis not present

## 2021-06-05 DIAGNOSIS — E119 Type 2 diabetes mellitus without complications: Secondary | ICD-10-CM

## 2021-06-05 DIAGNOSIS — F419 Anxiety disorder, unspecified: Secondary | ICD-10-CM

## 2021-06-05 DIAGNOSIS — M5432 Sciatica, left side: Secondary | ICD-10-CM

## 2021-06-05 DIAGNOSIS — E1159 Type 2 diabetes mellitus with other circulatory complications: Secondary | ICD-10-CM

## 2021-06-05 DIAGNOSIS — Z6836 Body mass index (BMI) 36.0-36.9, adult: Secondary | ICD-10-CM

## 2021-06-05 DIAGNOSIS — I152 Hypertension secondary to endocrine disorders: Secondary | ICD-10-CM

## 2021-06-05 LAB — GLUCOSE, POCT (MANUAL RESULT ENTRY): POC Glucose: 325 mg/dl — AB (ref 70–99)

## 2021-06-05 MED ORDER — MELOXICAM 7.5 MG PO TABS: 7.5000 mg | ORAL_TABLET | Freq: Every day | ORAL | 1 refills | Status: DC

## 2021-06-05 MED ORDER — METHOCARBAMOL 500 MG PO TABS: 500.0000 mg | ORAL_TABLET | Freq: Four times a day (QID) | ORAL | 3 refills | Status: DC

## 2021-06-05 NOTE — Progress Notes (Signed)
Has cramp in left thigh. Hand tightness.

## 2021-06-05 NOTE — Patient Instructions (Signed)
Sciatica Sciatica is pain, numbness, weakness, or tingling along the path of the sciatic nerve. The sciatic nerve starts in the lower back and runs down the back of each leg. The nerve controls the muscles in the lower leg and in the back of the knee. It also provides feeling (sensation) to the back of the thigh, the lower leg, and the sole of the foot. Sciatica is a symptom of another medical condition that pinches or puts pressure on the sciatic nerve. Sciatica most often only affects one side of the body. Sciatica usually goes away on its own or with treatment. In some cases, sciatica may come back (recur). What are the causes? This condition is caused by pressure on the sciatic nerve or pinching of the nerve. This may be the result of: A disk in between the bones of the spine bulging out too far (herniated disk). Age-related changes in the spinal disks. A pain disorder that affects a muscle in the buttock. Extra bone growth near the sciatic nerve. A break (fracture) of the pelvis. Pregnancy. Tumor. This is rare. What increases the risk? The following factors may make you more likely to develop this condition: Playing sports that place pressure or stress on the spine. Having poor strength and flexibility. A history of back injury or surgery. Sitting for long periods of time. Doing activities that involve repetitive bending or lifting. Obesity. What are the signs or symptoms? Symptoms can vary from mild to very severe, and they may include: Any of these problems in the lower back, leg, hip, or buttock: Mild tingling, numbness, or dull aches. Burning sensations. Sharp pains. Numbness in the back of the calf or the sole of the foot. Leg weakness. Severe back pain that makes movement difficult. Symptoms may get worse when you cough, sneeze, or laugh, or when you sit or stand for long periods of time. How is this diagnosed? This condition may be diagnosed based on: Your symptoms and  medical history. A physical exam. Blood tests. Imaging tests, such as: X-rays. MRI. CT scan. How is this treated? In many cases, this condition improves on its own without treatment. However, treatment may include: Reducing or modifying physical activity. Exercising and stretching. Icing and applying heat to the affected area. Medicines that help to: Relieve pain and swelling. Relax your muscles. Injections of medicines that help to relieve pain, irritation, and inflammation around the sciatic nerve (steroids). Surgery. Follow these instructions at home: Medicines Take over-the-counter and prescription medicines only as told by your health care provider. Ask your health care provider if the medicine prescribed to you: Requires you to avoid driving or using heavy machinery. Can cause constipation. You may need to take these actions to prevent or treat constipation: Drink enough fluid to keep your urine pale yellow. Take over-the-counter or prescription medicines. Eat foods that are high in fiber, such as beans, whole grains, and fresh fruits and vegetables. Limit foods that are high in fat and processed sugars, such as fried or sweet foods. Managing pain   If directed, put ice on the affected area. Put ice in a plastic bag. Place a towel between your skin and the bag. Leave the ice on for 20 minutes, 2-3 times a day. If directed, apply heat to the affected area. Use the heat source that your health care provider recommends, such as a moist heat pack or a heating pad. Place a towel between your skin and the heat source. Leave the heat on for 20-30 minutes. Remove   the heat if your skin turns bright red. This is especially important if you are unable to feel pain, heat, or cold. You may have a greater risk of getting burned. Activity  Return to your normal activities as told by your health care provider. Ask your health care provider what activities are safe for you. Avoid  activities that make your symptoms worse. Take brief periods of rest throughout the day. When you rest for longer periods, mix in some mild activity or stretching between periods of rest. This will help to prevent stiffness and pain. Avoid sitting for long periods of time without moving. Get up and move around at least one time each hour. Exercise and stretch regularly, as told by your health care provider. Do not lift anything that is heavier than 10 lb (4.5 kg) while you have symptoms of sciatica. When you do not have symptoms, you should still avoid heavy lifting, especially repetitive heavy lifting. When you lift objects, always use proper lifting technique, which includes: Bending your knees. Keeping the load close to your body. Avoiding twisting. General instructions Maintain a healthy weight. Excess weight puts extra stress on your back. Wear supportive, comfortable shoes. Avoid wearing high heels. Avoid sleeping on a mattress that is too soft or too hard. A mattress that is firm enough to support your back when you sleep may help to reduce your pain. Keep all follow-up visits as told by your health care provider. This is important. Contact a health care provider if: You have pain that: Wakes you up when you are sleeping. Gets worse when you lie down. Is worse than you have experienced in the past. Lasts longer than 4 weeks. You have an unexplained weight loss. Get help right away if: You are not able to control when you urinate or have bowel movements (incontinence). You have: Weakness in your lower back, pelvis, buttocks, or legs that gets worse. Redness or swelling of your back. A burning sensation when you urinate. Summary Sciatica is pain, numbness, weakness, or tingling along the path of the sciatic nerve. This condition is caused by pressure on the sciatic nerve or pinching of the nerve. Sciatica can cause pain, numbness, or tingling in the lower back, legs, hips, and  buttocks. Treatment often includes rest, exercise, medicines, and applying ice or heat. This information is not intended to replace advice given to you by your health care provider. Make sure you discuss any questions you have with your health care provider. Document Revised: 04/25/2018 Document Reviewed: 04/25/2018 Elsevier Patient Education  2022 [employer] Inc.  

## 2021-06-05 NOTE — Progress Notes (Signed)
Subjective:  Patient ID: Victor [name], male    DOB: 11-04-1970  Age: 51 y.o. MRN: [id number]  CC: New Patient (Initial Visit)   HPI Victor [name] is a 51 y.o. year old male with a history of type 2 diabetes mellitus (A1c 8.6), His diabetes is managed by endocrine with his last visit on 05/16/2021. Moved here from [place] in 04/2021. He had an ED visit last week for left lower extremity pain for which DVT was ruled out by means of negative Doppler and pain thought to be musculoskeletal versus radiculopathy.  Interval History: Today he complains of cramp in his left thigh which is the same cramp that ttok him to the ED. He describes it as a Nurse, mental health for 2-3 weeks and is not relied by anything. It radiates from the L hip down L leg and he has some numbness. He ambulates with a cane due to arthritis in his knees and hips. Symptoms are 7-8/10 when siting still but when he moves or wakes up in the morning symptoms are worse.  He has neuropathy in his hands and was told he would need surgery while he was in [place]. He is unable to make a fist in his left hand. Diabetes is managed by endocrinology, Dr Loanne Drilling with whom he had elevated last month.  He does not need refills today. Past Medical History:  Diagnosis Date   Acute on chronic congestive heart failure (HCC)    Arthritis    Gout    Hay fever    High cholesterol    Hypertension    Mental health problem    Near syncope 12/27/2019   Pre-syncope 12/27/2019   Shortness of breath 02/06/2020   Syncope 12/26/2019   Type 2 diabetes mellitus without complication, without long-term current use of insulin (HCC)    Unstable angina ([place]) 02/06/2020    Past Surgical History:  Procedure Laterality Date   EYE SURGERY     HEMORROIDECTOMY     RIGHT/LEFT HEART CATH AND CORONARY ANGIOGRAPHY N/A 02/06/2020   Procedure: RIGHT/LEFT HEART CATH AND CORONARY ANGIOGRAPHY;  Surgeon: Leonie Man, MD;  Location: [facility];  Service:  Cardiovascular;  Laterality: N/A;   WISDOM TOOTH EXTRACTION      Family History  Problem Relation Age of Onset   Diabetes Mother    Hypertension Father    Hypertension Brother    Hypertension Maternal Grandfather    Hypertension Paternal Grandmother     Allergies  Allergen Reactions   Shellfish-Derived Products Hives   Coconut Oil Hives and Rash    Outpatient Medications Prior to Visit  Medication Sig Dispense Refill   ACCU-CHEK GUIDE test strip 1 each by Other route 2 (two) times daily. as directed     Accu-Chek Softclix Lancets lancets 1 each by Other route in the morning and at bedtime.     acetaminophen (TYLENOL) 325 MG tablet Take 2 tablets (650 mg total) by mouth every 6 (six) hours as needed for mild pain (or Fever >/= 101).     bromocriptine (PARLODEL) 2.5 MG tablet Take 1 tablet (2.5 mg total) by mouth at bedtime. 90 tablet 3   empagliflozin (JARDIANCE) 25 MG TABS tablet Take 1 tablet (25 mg total) by mouth daily before breakfast. 90 tablet 3   furosemide (LASIX) 20 MG tablet Take 1 tablet (20 mg total) by mouth daily. Take with 40 mg for a total of 60 mg 30 tablet 1   furosemide (LASIX) 40 MG tablet Take  40 mg by mouth daily. Take with 20 mg for a total of 60 mg     gabapentin (NEURONTIN) 300 MG capsule Take 300 mg by mouth every evening.     glipiZIDE (GLUCOTROL) 5 MG tablet Take 1 tablet (5 mg total) by mouth daily before breakfast. 90 tablet 3   hydrOXYzine (ATARAX) 10 MG tablet Take 1 tablet (10 mg total) by mouth 3 (three) times daily as needed. 30 tablet 0   losartan (COZAAR) 25 MG tablet Take 25 mg by mouth daily.     metFORMIN (GLUCOPHAGE-XR) 500 MG 24 hr tablet Take 2 tablets (1,000 mg total) by mouth daily. 180 tablet 3   metoprolol succinate (TOPROL-XL) 25 MG 24 hr tablet Take 0.5 tablets (12.5 mg total) by mouth daily. 45 tablet 1   potassium chloride SA (KLOR-CON) 20 MEQ tablet Take 20 mEq by mouth daily.     rosuvastatin (CRESTOR) 10 MG tablet Take 10 mg by  mouth daily.     sertraline (ZOLOFT) 50 MG tablet Take 50 mg by mouth daily.     triamcinolone cream (KENALOG) 0.1 % Apply 1 application topically 2 (two) times daily. 80 g 0   TRULICITY 4.5 0000000 SOPN Inject 4.5 mg into the skin once a week. 6 mL 3   witch hazel-glycerin (TUCKS) pad Place rectally.     methocarbamol (ROBAXIN) 500 MG tablet Take 1 tablet (500 mg total) by mouth 4 (four) times daily. 20 tablet 0   No facility-administered medications prior to visit.     ROS Review of Systems  Constitutional:  Negative for activity change and appetite change.  HENT:  Negative for sinus pressure and sore throat.   Eyes:  Negative for visual disturbance.  Respiratory:  Negative for cough, chest tightness and shortness of breath.   Cardiovascular:  Negative for chest pain and leg swelling.  Gastrointestinal:  Negative for abdominal distention, abdominal pain, constipation and diarrhea.  Endocrine: Negative.   Genitourinary:  Negative for dysuria.  Musculoskeletal:        See HPI  Skin:  Negative for rash.  Allergic/Immunologic: Negative.   Neurological:  Negative for weakness, light-headedness and numbness.  Psychiatric/Behavioral:  Negative for dysphoric mood and suicidal ideas.    Objective:  BP (!) 142/84    Pulse 87    Ht 5\' 11"  (1.803 m)    Wt 261 lb (118.4 kg)    SpO2 99%    BMI 36.40 kg/m   BP/Weight 06/05/2021 [id number]  Systolic BP [id number] -  Diastolic BP 84 82 -  Wt. (Lbs) 261 - 255  BMI 36.4 - 35.57      Physical Exam Constitutional:      Appearance: He is well-developed.  Cardiovascular:     Rate and Rhythm: Normal rate.     Heart sounds: Normal heart sounds. No murmur heard. Pulmonary:     Effort: Pulmonary effort is normal.     Breath sounds: Normal breath sounds. No wheezing or rales.  Chest:     Chest wall: No tenderness.  Abdominal:     General: Bowel sounds are normal. There is no distension.     Palpations: Abdomen is soft. There is no  mass.     Tenderness: There is no abdominal tenderness.  Musculoskeletal:     Right lower leg: No edema.     Left lower leg: No edema.     Comments: Wasting of muscles of lateral aspect of left dorsum.  Unable to make a  fist in left hand.  Able to make a partial fist and right hand. Slight tenderness on palpation of left side of back.  Positive straight leg raise bilaterally; left greater than right.  Neurological:     Mental Status: He is alert and oriented to person, place, and time.  Psychiatric:        Mood and Affect: Mood normal.    CMP Latest Ref Rng & Units 05/16/2021 02/06/2020 02/06/2020  Glucose 70 - 99 mg/dL 184(H) - -  BUN 6 - 23 mg/dL 17 - -  Creatinine 0.40 - 1.50 mg/dL 0.95 - -  Sodium 135 - 145 mEq/L 138 141 141  Potassium 3.5 - 5.1 mEq/L 4.4 3.8 3.9  Chloride 96 - 112 mEq/L 104 - -  CO2 19 - 32 mEq/L 27 - -  Calcium 8.4 - 10.5 mg/dL 9.9 - -  Total Protein 6.5 - 8.1 g/dL - - -  Total Bilirubin 0.3 - 1.2 mg/dL - - -  Alkaline Phos 38 - 126 U/L - - -  AST 15 - 41 U/L - - -  ALT 0 - 44 U/L - - -    Lipid Panel     Component Value Date/Time   CHOL 168 12/27/2019 1228   TRIG 185 (H) 12/27/2019 1228   HDL 32 (L) 12/27/2019 1228   CHOLHDL 5.3 12/27/2019 1228   VLDL 37 12/27/2019 1228   LDLCALC 99 12/27/2019 1228    CBC    Component Value Date/Time   WBC 5.7 01/31/2020 1518   WBC 4.3 12/28/2019 0817   RBC 5.30 01/31/2020 1518   RBC 5.48 12/28/2019 0817   HGB 13.6 02/06/2020 0810   HGB 14.6 01/31/2020 1518   HCT 40.0 02/06/2020 0810   HCT 44.4 01/31/2020 1518   PLT 239 01/31/2020 1518   MCV 84 01/31/2020 1518   MCH 27.5 01/31/2020 1518   MCH 27.0 12/28/2019 0817   MCHC 32.9 01/31/2020 1518   MCHC 32.7 12/28/2019 0817   RDW 13.0 01/31/2020 1518    Lab Results  Component Value Date   HGBA1C 8.6 (A) 05/16/2021    Assessment & Plan:  1. Type 2 diabetes mellitus without complication, without long-term current use of insulin (HCC) Uncontrolled with  A1c of 8.6 Advised to continue regimen as prescribed by endocrinology and keep follow-up appointments. Counseled on Diabetic diet, my plate method, [id number] minutes of moderate intensity exercise/week Blood sugar logs with fasting goals of 80-120 mg/dl, random of less than 180 and in the event of sugars less than 60 mg/dl or greater than 400 mg/dl encouraged to notify the clinic. Advised on the need for annual eye exams, annual foot exams, Pneumonia vaccine. - POCT glucose (manual entry) - Ambulatory referral to Ophthalmology  2. Anxiety Currently controlled on Zoloft This is managed by mental health at [facility] He would benefit from Cymbalta due to analgesic effect but I have advised him to discuss this with his psychiatrist  3. Other diabetic neurological complication associated with type 2 diabetes mellitus (HCC) Currently on gabapentin  4. Ulnar neuropathy of left upper extremity Uncontrolled He does have wasting in his left hand Cymbalta will be beneficial with regards to pain but will need to check with psychiatrist given he is already on Zoloft - AMB referral to orthopedics  5. Sciatica of left side Uncontrolled - Ambulatory referral to Physical Therapy - methocarbamol (ROBAXIN) 500 MG tablet; Take 1 tablet (500 mg total) by mouth 4 (four) times daily.  Dispense: 60 tablet;  Refill: 3 - meloxicam (MOBIC) 7.5 MG tablet; Take 1 tablet (7.5 mg total) by mouth daily.  Dispense: 30 tablet; Refill: 1  6. Hypertension associated with diabetes ([place]) Slightly above goal No regimen change today but will recheck at next visit Counseled on blood pressure goal of less than 130/80, low-sodium, DASH diet, medication compliance, 150 minutes of moderate intensity exercise per week. Discussed medication compliance, adverse effects.  7. Morbid obesity ([place]) Counseled on 150 minutes of exercise per week, healthy eating (including decreased daily intake of saturated fats, cholesterol, added sugars,  sodium),healthcare maintenance.     Meds ordered this encounter  Medications   methocarbamol (ROBAXIN) 500 MG tablet    Sig: Take 1 tablet (500 mg total) by mouth 4 (four) times daily.    Dispense:  60 tablet    Refill:  3   meloxicam (MOBIC) 7.5 MG tablet    Sig: Take 1 tablet (7.5 mg total) by mouth daily.    Dispense:  30 tablet    Refill:  1    Follow-up: Return in about 3 months (around 09/02/2021).       Charlott Rakes, MD, FAAFP. [facility] [place], [place]   06/05/2021, 9:37 AM

## 2021-06-12 ENCOUNTER — Other Ambulatory Visit: Payer: Self-pay

## 2021-06-12 ENCOUNTER — Ambulatory Visit (INDEPENDENT_AMBULATORY_CARE_PROVIDER_SITE_OTHER): Payer: Medicaid Other | Admitting: Surgery

## 2021-06-12 ENCOUNTER — Ambulatory Visit: Payer: Self-pay

## 2021-06-12 ENCOUNTER — Encounter: Payer: Self-pay | Admitting: Surgery

## 2021-06-12 VITALS — BP 121/82 | HR 83 | Ht 71.0 in | Wt 261.0 lb

## 2021-06-12 DIAGNOSIS — M5441 Lumbago with sciatica, right side: Secondary | ICD-10-CM | POA: Diagnosis not present

## 2021-06-12 DIAGNOSIS — M4726 Other spondylosis with radiculopathy, lumbar region: Secondary | ICD-10-CM

## 2021-06-12 DIAGNOSIS — M5442 Lumbago with sciatica, left side: Secondary | ICD-10-CM | POA: Diagnosis not present
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
## 2021-06-23 ENCOUNTER — Other Ambulatory Visit: Payer: Medicaid Other

## 2021-06-23 NOTE — Therapy (Signed)
OUTPATIENT PHYSICAL THERAPY EVALUATION   Patient Name: Victor [name] MRN: [id number] DOB:1970/12/21, 51 y.o., male Today's Date: 06/24/2021   PT End of Session - 06/24/21 0853     Visit Number 1    Number of Visits 8    Date for PT Re-Evaluation 08/19/21    Authorization Type [employer]    Authorization - Number of Visits 27    PT Start Time 0910    PT Stop Time 0955    PT Time Calculation (min) 45 min    Activity Tolerance Patient tolerated treatment well    Behavior During Therapy [facility] for tasks assessed/performed             Past Medical History:  Diagnosis Date   Acute on chronic congestive heart failure (HCC)    Arthritis    Gout    Hay fever    High cholesterol    Hypertension    Mental health problem    Near syncope 12/27/2019   Pre-syncope 12/27/2019   Shortness of breath 02/06/2020   Syncope 12/26/2019   Type 2 diabetes mellitus without complication, without long-term current use of insulin (HCC)    Unstable angina (HCC) 02/06/2020   Past Surgical History:  Procedure Laterality Date   EYE SURGERY     HEMORROIDECTOMY     RIGHT/LEFT HEART CATH AND CORONARY ANGIOGRAPHY N/A 02/06/2020   Procedure: RIGHT/LEFT HEART CATH AND CORONARY ANGIOGRAPHY;  Surgeon: Marykay Lex, MD;  Location: [facility];  Service: Cardiovascular;  Laterality: N/A;   WISDOM TOOTH EXTRACTION     Patient Active Problem List   Diagnosis Date Noted   Anxiety 06/05/2021   Bedbug bite 05/13/2021   Localized edema 05/13/2021   PSVT (paroxysmal supraventricular tachycardia) (HCC) 02/19/2020   NSVT (nonsustained ventricular tachycardia) 02/19/2020   Obesity (BMI 30-39.9) 02/19/2020   Shortness of breath 02/06/2020   Unstable angina (HCC) 02/06/2020   Mental health problem    Hypertension    High cholesterol    Hay fever    Gout    Arthritis    Type 2 diabetes mellitus without complication, without long-term current use of insulin (HCC)    Near syncope 12/27/2019   Pre-syncope  12/27/2019   Acute on chronic congestive heart failure (HCC)    Syncope 12/26/2019    PCP: Nechama Guard, FNP  REFERRING PROVIDER: Hoy Register, MD  REFERRING DIAG: Sciatica of left side  THERAPY DIAG:  Chronic bilateral low back pain with left-sided sciatica  Muscle weakness (generalized)  ONSET DATE: "ongoing for about a month"  SUBJECTIVE:          SUBJECTIVE STATEMENT: Patient reports feeling of charlie horse on left side from the left hip/butt down to the calf muscles, feels like he is sitting on a nerve. He also reports previous chronic low back pain, and neuropathy of both hands and feet, he has trouble making a fist. He reports the charlie horse feeling has been ongoing for about a month and came on with no specific mechanism. He reports some days will be worse than others. Sitting or standing for extended periods will aggravate the pain and make the leg feel like it goes to sleep. Patient also notes sleep difficulties due to the pain.   Patient ambulates with cane due to previous issues, states he has passed out multiple times and also with knee and hip arthritis.  PERTINENT HISTORY:  DM II, CHF, HTN, BMI, LE edema, chronic hip/knee arthritis  PAIN:  Are  you having pain? Yes NPRS scale: 8-9/10 Pain location: Back Pain orientation: Lower PAIN TYPE: Chronic Pain description: Constant, "charlie horse" Aggravating factors: Sitting or standing extended periods, bending Relieving factors: Nothing  PRECAUTIONS: None  WEIGHT BEARING RESTRICTIONS No  FALLS:  Has patient fallen in last 6 months? No  LIVING ENVIRONMENT: Lives with: lives alone Lives in: House/apartment Stairs: No  OCCUPATION: Disability  PLOF: Independent  PATIENT GOALS: Find ways to improve pain   OBJECTIVE:  DIAGNOSTIC FINDINGS:  Lumbar CT 07/22/2019: No acute fracture. Minimal anterior height loss of T11 and T12 vertebral bodies is congenital or chronic degenerative. Moderate to severe  lumbar spondylosis.   PATIENT SURVEYS:  Modified Oswestry 25/50   SCREENING FOR RED FLAGS: Negative  COGNITION: Overall cognitive status: Within functional limits for tasks assessed     SENSATION: Light touch: Patient reports generalized lower leg and foot sensation deficits secondary to neuropathy  MUSCLE LENGTH: Slight   POSTURE:  Increase in lumbar lordosis  PALPATION: Tender to palpation left lumbar paraspinals, gluteal/piriformis regions with referral to posterior thigh  LUMBAR AROM  AROM A/PROM  06/24/2021  Flexion 75% - reports tightening of lower back and left hamstring  Extension 50% - reports midline low back pain  Right lateral flexion 75%  Left lateral flexion 75%  Right rotation 75%  Left rotation 75%   All movement slow and hesitant  No directional preferences noted with movement  LE AROM/PROM:   Hip PROM grossly WFL  LE MMT:  MMT Right 06/24/2021 Left 06/24/2021  Core 3  Hip flexion 4- 4-  Hip extension 3 3-  Hip abduction 3 3-  Knee flexion 4 4  Knee extension 4 4   LUMBAR SPECIAL TESTS:  Straight leg raise test: Positive  FUNCTIONAL TESTS:  Sit to stand: patient requires BUE for support to stand secondary to back, hip, knee, left leg pain SLS: unable bilaterally  GAIT: Assistive device utilized: Single point cane Level of assistance: Modified independence Comments: patient with decreased gait speed, wide BOS with bilateral toe out,    TODAY'S TREATMENT  Piriformis stretch 2 x 30 sec (left only) LTR 3 x 5 sec each Posterior pelvic tilt 5 x 5 sec Side clamshell x 10 Seated hamstring stretch 2 x 30 sec  PATIENT EDUCATION:  Education details: Exam findings, POC, HEP Person educated: Patient Education method: Explanation, Demonstration, Tactile cues, Verbal cues, and Handouts Education comprehension: verbalized understanding, returned demonstration, verbal cues required, tactile cues required, and needs further education  HOME  EXERCISE PROGRAM: Access Code: [email or web address]   ASSESSMENT: CLINICAL IMPRESSION: Patient is a 51 y.o. male who was seen today for physical therapy evaluation and treatment for chronic low back pain with posterior left leg pain. Patient with high irritability of symptoms this visit and high resting pain level so evaluation limited due to pain. Patient's left leg pain does seem to be radicular, however no directional preference noted this visit. Patient demonstrates gross limitations in lumbar motion and significant strength deficit of the core and hips likely contributing to symptoms. Patient does have previous history of bilateral hip and knee arthritis limiting mobility.   OBJECTIVE IMPAIRMENTS Abnormal gait, decreased activity tolerance, decreased balance, difficulty walking, decreased ROM, decreased strength, impaired flexibility, impaired sensation, improper body mechanics, postural dysfunction, and pain.   ACTIVITY LIMITATIONS cleaning, community activity, driving, meal prep, occupation, laundry, yard work, and shopping.   PERSONAL FACTORS Fitness, Past/current experiences, and Time since onset of injury/illness/exacerbation are also affecting patient's functional outcome.  REHAB POTENTIAL: Good  CLINICAL DECISION MAKING: Stable/uncomplicated  EVALUATION COMPLEXITY: Low   GOALS: Goals reviewed with patient? Yes  SHORT TERM GOALS:  Patient will be I with initial HEP in order to progress with therapy. Baseline: HEP provided at evaluation Target date: 07/22/2021 Goal status: INITIAL  2.  Patient will report pain level </= 6/10 in order to reduce functional limitations with household tasks and lifting. Baseline: 8-9/10 pain level Target date: 07/22/2021 Goal status: INITIAL  LONG TERM GOALS:  Patient will be I with final HEP to maintain progress from PT. Baseline: HEP provided at evaluation Target date: 08/19/2021 Goal status: INITIAL  2.  Patient will report </= 15/50 on Modified  ODI in order to indicate improved functional ability Baseline: 25/50 Modified ODI Target date: 08/19/2021 Goal status: INITIAL  3.  Patient will demonstrate lumbar AROM without increase in pain in order to improve ability to perform household tasks. Baseline: patient reports increase in low back and left leg pain primarily with flexion and extension movements Target date: 08/19/2021 Goal status: INITIAL  4.  Patient will demonstrate >/= 4-/5 MMT of core and hip strength in order to improve standing and walking tolerance Baseline: patient demonstrates gross limitations in core and hip strength (see above) Target date: 08/19/2021 Goal status: INITIAL   PLAN: PT FREQUENCY: 1x/week  PT DURATION: 8 weeks  PLANNED INTERVENTIONS: Therapeutic exercises, Therapeutic activity, Neuromuscular re-education, Balance training, Gait training, Patient/Family education, Joint manipulation, Joint mobilization, Aquatic Therapy, Dry Needling, Electrical stimulation, Spinal manipulation, Spinal mobilization, Cryotherapy, Moist heat, and Manual therapy  PLAN FOR NEXT SESSION: Review HEP and progress PRN, manual/dry needling for left lumbar and hip region, lumbar and hip flexibility, progress core stabilization and hip strengthening   Rosana Hoes, PT, DPT, LAT, ATC 06/24/21  1:27 PM Phone: [phone] Fax: [phone]

## 2021-06-24 ENCOUNTER — Other Ambulatory Visit: Payer: Self-pay

## 2021-06-24 ENCOUNTER — Ambulatory Visit: Payer: Medicaid Other | Attending: Family Medicine | Admitting: Physical Therapy

## 2021-06-24 ENCOUNTER — Encounter: Payer: Self-pay | Admitting: Physical Therapy

## 2021-06-24 DIAGNOSIS — M6281 Muscle weakness (generalized): Secondary | ICD-10-CM | POA: Diagnosis present

## 2021-06-24 DIAGNOSIS — M5432 Sciatica, left side: Secondary | ICD-10-CM | POA: Diagnosis not present

## 2021-06-24 DIAGNOSIS — G8929 Other chronic pain: Secondary | ICD-10-CM | POA: Insufficient documentation

## 2021-06-24 DIAGNOSIS — M5442 Lumbago with sciatica, left side: Secondary | ICD-10-CM | POA: Diagnosis present

## 2021-06-24 NOTE — Patient Instructions (Signed)
Access Code: [email or web address] ?URL: [email or web address] ?Date: 06/24/2021 ?Prepared by: Rosana Hoes ? ?Exercises ?Supine Piriformis Stretch with Foot on Ground - 1 x daily - 3 reps - 30 seconds hold ?Supine Lower Trunk Rotation - 1 x daily - 10 reps - 5 seconds hold ?Supine Posterior Pelvic Tilt - 1 x daily - 2 sets - 5 reps - 5 seconds hold ?Clamshell - 1 x daily - 2 sets - 10 reps ?Seated Hamstring Stretch - 1 x daily - 3 reps - 30 seconds hold ? ?

## 2021-07-01 ENCOUNTER — Ambulatory Visit: Payer: Medicaid Other | Attending: Family Medicine | Admitting: Family Medicine

## 2021-07-01 ENCOUNTER — Other Ambulatory Visit: Payer: Self-pay

## 2021-07-01 VITALS — BP 116/79 | HR 75 | Ht 71.0 in | Wt 260.0 lb

## 2021-07-01 DIAGNOSIS — Z1211 Encounter for screening for malignant neoplasm of colon: Secondary | ICD-10-CM | POA: Diagnosis not present

## 2021-07-01 DIAGNOSIS — E1149 Type 2 diabetes mellitus with other diabetic neurological complication: Secondary | ICD-10-CM | POA: Diagnosis not present

## 2021-07-01 DIAGNOSIS — R600 Localized edema: Secondary | ICD-10-CM

## 2021-07-01 DIAGNOSIS — M47816 Spondylosis without myelopathy or radiculopathy, lumbar region: Secondary | ICD-10-CM | POA: Diagnosis not present

## 2021-07-01 NOTE — Progress Notes (Signed)
? ?Subjective:  ?Patient ID: Victor [name], male    DOB: 02-04-1971  Age: 51 y.o. MRN: [id number] ? ?CC: No chief complaint on file. ? ? ?HPI ?Victor Lieulmer Bodmer is a 51 y.o. year old male with a history of  type 2 diabetes mellitus (A1c 8.6).  He had a visit to establish care with me last month and was supposed to follow-up in 3 months. ? 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 ? ?He is here because he noticed LLE swelling a few weeks ago and he has been taking Lasix 60mg  consistently. L leg edema > R leg edema. He has also to go up and his shoe size from 11 to 14. His L hip is also hurting and this is extending from his lower back to his left hip. ?Past Medical History:  ?Diagnosis Date  ? Acute on chronic congestive heart failure (HCC)   ? Arthritis   ? Gout   ? Hay fever   ? High cholesterol   ? Hypertension   ? Mental health problem   ? Near syncope 12/27/2019  ? Pre-syncope 12/27/2019  ? Shortness of breath 02/06/2020  ? Syncope 12/26/2019  ? Type 2 diabetes mellitus without complication, without long-term current use of insulin (HCC)   ? Unstable angina (HCC) 02/06/2020  ? ? ?Past Surgical History:  ?Procedure Laterality Date  ? EYE SURGERY    ? HEMORROIDECTOMY    ? RIGHT/LEFT HEART CATH AND CORONARY ANGIOGRAPHY N/A 02/06/2020  ? Procedure: RIGHT/LEFT HEART CATH AND CORONARY ANGIOGRAPHY;  Surgeon: Marykay LexHarding, David W, MD;  [facility];  Service: Cardiovascular;  Laterality: N/A;  ? WISDOM TOOTH EXTRACTION    ? ? ?Family History  ?Problem Relation Age of Onset  ? Diabetes Mother   ? Hypertension Father   ? Hypertension Brother   ? Hypertension Maternal Grandfather   ? Hypertension Paternal Grandmother   ? ? ?Social History   ? ?Socioeconomic History  ? Marital status: Divorced  ?  Spouse name: Not on file  ? Number of children: Not on file  ? Years of education: Not on file  ? Highest education level: Not on file  ?Occupational History  ? Not on file  ?Tobacco Use  ? Smoking status: Former  ?  Types: Cigarettes  ?  Quit date: 04/20/2000  ?  Years since quitting: 21.2  ? Smokeless tobacco: Never  ?Vaping Use  ? Vaping Use: Never used  ?Substance and Sexual Activity  ? Alcohol use: Never  ? Drug use: Never  ? Sexual activity: Not Currently  ?Other Topics Concern  ? Not on file  ?Social History Narrative  ? Works 3rd shift   ? ?Social Determinants of Health  ? ?Financial Resource Strain: Not on file  ?Food Insecurity: Not on file  ?Transportation Needs: Not on file  ?Physical Activity: Not on file  ?Stress: Not on file  ?Social Connections: Not on file  ? ? ?Allergies  ?Allergen Reactions  ? Shellfish-Derived Products Hives  ? Coconut Oil Hives and Rash  ? ? ?Outpatient Medications Prior to Visit  ?Medication Sig Dispense Refill  ? ACCU-CHEK GUIDE test strip 1 each by Other route 2 (two) times daily. as  directed    ? Accu-Chek Softclix Lancets lancets 1 each by Other route in the morning and at bedtime.    ? acetaminophen (TYLENOL) 325 MG tablet Take 2 tablets (650 mg total) by mouth every 6 (six) hours as needed for mild pain (or Fever >/= 101).    ? bromocriptine (PARLODEL) 2.5 MG tablet Take 1 tablet (2.5 mg total) by mouth at bedtime. 90 tablet 3  ? empagliflozin (JARDIANCE) 25 MG TABS tablet Take 1 tablet (25 mg total) by mouth daily before breakfast. 90 tablet 3  ? furosemide (LASIX) 20 MG tablet Take 1 tablet (20 mg total) by mouth daily. Take with 40 mg for a total of 60 mg 30 tablet 1  ? furosemide (LASIX) 40 MG tablet Take 40 mg by mouth daily. Take with 20 mg for a total of 60 mg    ? gabapentin (NEURONTIN) 300 MG capsule Take 300 mg by mouth every evening.    ? glipiZIDE (GLUCOTROL) 5 MG tablet Take 1 tablet (5 mg total) by  mouth daily before breakfast. 90 tablet 3  ? hydrOXYzine (ATARAX) 10 MG tablet Take 1 tablet (10 mg total) by mouth 3 (three) times daily as needed. 30 tablet 0  ? losartan (COZAAR) 25 MG tablet Take 25 mg by mouth daily.    ? meloxicam (MOBIC) 7.5 MG tablet Take 1 tablet (7.5 mg total) by mouth daily. 30 tablet 1  ? metFORMIN (GLUCOPHAGE-XR) 500 MG 24 hr tablet Take 2 tablets (1,000 mg total) by mouth daily. 180 tablet 3  ? methocarbamol (ROBAXIN) 500 MG tablet Take 1 tablet (500 mg total) by mouth 4 (four) times daily. 60 tablet 3  ? metoprolol succinate (TOPROL-XL) 25 MG 24 hr tablet Take 0.5 tablets (12.5 mg total) by mouth daily. 45 tablet 1  ? potassium chloride SA (KLOR-CON) 20 MEQ tablet Take 20 mEq by mouth daily.    ? rosuvastatin (CRESTOR) 10 MG tablet Take 10 mg by mouth daily.    ? sertraline (ZOLOFT) 50 MG tablet Take 50 mg by mouth daily.    ? triamcinolone cream (KENALOG) 0.1 % Apply 1 application topically 2 (two) times daily. 80 g 0  ? TRULICITY 4.5 MG/0.5ML SOPN Inject 4.5 mg into the skin once a week. 6 mL 3  ? witch hazel-glycerin (TUCKS) pad Place rectally.    ? ?No facility-administered medications prior to visit.  ? ? ? ?ROS ?Review of Systems  ?Constitutional:  Negative for activity change and appetite change.  ?HENT:  Negative for sinus pressure and sore throat.   ?Eyes:  Negative for visual disturbance.  ?Respiratory:  Negative for cough, chest tightness and shortness of breath.   ?Cardiovascular:  Positive for leg swelling. Negative for chest pain.  ?Gastrointestinal:  Negative for abdominal distention, abdominal pain, constipation and diarrhea.  ?Endocrine: Negative.   ?Genitourinary:  Negative for dysuria.  ?Musculoskeletal:  Positive for back pain. Negative for joint swelling and myalgias.  ?Skin:  Negative for rash.  ?Allergic/Immunologic: Negative.   ?Neurological:  Negative for weakness, light-headedness and numbness.  ?Psychiatric/Behavioral:  Negative for dysphoric mood and  suicidal ideas.   ? ?Objective:  ?There were no vitals taken for this visit. ? ?BP/Weight 06/12/2021 06/05/2021 05/28/2021  ?Systolic BP 121 142 113  ?Diastolic BP 82 84 82  ?Wt. (Lbs) 261 261 -  ?BMI 36.4 36.4 -  ? ? ? ? ?Physical Exam ?Constitutional:   ?   Appearance: He is well-developed.  ?Cardiovascular:  ?   Rate  and Rhythm: Normal rate.  ?   Heart sounds: Normal heart sounds. No murmur heard. ?Pulmonary:  ?   Effort: Pulmonary effort is normal.  ?   Breath sounds: Normal breath sounds. No wheezing or rales.  ?Chest:  ?   Chest wall: No tenderness.  ?Abdominal:  ?   General: Bowel sounds are normal. There is no distension.  ?   Palpations: Abdomen is soft. There is no mass.  ?   Tenderness: There is no abdominal tenderness.  ?Musculoskeletal:  ?   Right lower leg: Edema (2+ non pitting) present.  ?   Left lower leg: Edema (1+ non pitting) present.  ?   Comments: Negative Denna Haggard' sign bilaterally  ?Neurological:  ?   Mental Status: He is alert and oriented to person, place, and time.  ?Psychiatric:     ?   Mood and Affect: Mood normal.  ? ? ?CMP Latest Ref Rng & Units 05/16/2021 02/06/2020 02/06/2020  ?Glucose 70 - 99 mg/dL 315(Q) - -  ?BUN 6 - 23 mg/dL 17 - -  ?Creatinine 0.40 - 1.50 mg/dL 0.08 - -  ?Sodium 135 - 145 mEq/L 138 141 141  ?Potassium 3.5 - 5.1 mEq/L 4.4 3.8 3.9  ?Chloride 96 - 112 mEq/L 104 - -  ?CO2 19 - 32 mEq/L 27 - -  ?Calcium 8.4 - 10.5 mg/dL 9.9 - -  ?Total Protein 6.5 - 8.1 g/dL - - -  ?Total Bilirubin 0.3 - 1.2 mg/dL - - -  ?Alkaline Phos 38 - 126 U/L - - -  ?AST 15 - 41 U/L - - -  ?ALT 0 - 44 U/L - - -  ? ? ?Lipid Panel  ?   ?Component Value Date/Time  ? CHOL 168 12/27/2019 1228  ? TRIG 185 (H) 12/27/2019 1228  ? HDL 32 (L) 12/27/2019 1228  ? CHOLHDL 5.3 12/27/2019 1228  ? VLDL 37 12/27/2019 1228  ? LDLCALC 99 12/27/2019 1228  ? ? ?CBC ?   ?Component Value Date/Time  ? WBC 5.7 01/31/2020 1518  ? WBC 4.3 12/28/2019 0817  ? RBC 5.30 01/31/2020 1518  ? RBC 5.48 12/28/2019 0817  ? HGB 13.6  02/06/2020 0810  ? HGB 14.6 01/31/2020 1518  ? HCT 40.0 02/06/2020 0810  ? HCT 44.4 01/31/2020 1518  ? PLT 239 01/31/2020 1518  ? MCV 84 01/31/2020 1518  ? MCH 27.5 01/31/2020 1518  ? MCH 27.0 12/28/2019 0817  ? MCHC 32.9 10/13/2

## 2021-07-01 NOTE — Progress Notes (Signed)
Swelling in legs, ?Lower back pain. ?Discuss MRI ?Referral to pain management. ?

## 2021-07-01 NOTE — Patient Instructions (Addendum)
Edema ?Edema is when you have too much fluid in your body or under your skin. Edema may make your legs, feet, and ankles swell. Swelling often happens in looser tissues, such as around your eyes. This is a common condition. It gets more common as you get older. ?There are many possible causes of edema. These include: ?Eating too much salt (sodium). ?Being on your feet or sitting for a long time. ?Certain medical conditions, such as: ?Pregnancy. ?Heart failure. ?Liver disease. ?Kidney disease. ?Cancer. ?Hot weather may make edema worse. Edema is usually painless. Your skin may look swollen or shiny. ?Follow these instructions at home: ?Medicines ?Take over-the-counter and prescription medicines only as told by your doctor. ?Your doctor may prescribe a medicine to help your body get rid of extra water (diuretic). Take this medicine if you are told to take it. ?Eating and drinking ?Eat a low-salt (low-sodium) diet as told by your doctor. Sometimes, eating less salt may reduce swelling. ?Depending on the cause of your swelling, you may need to limit how much fluid you drink (fluid restriction). ?General instructions ?Raise the injured area above the level of your heart while you are sitting or lying down. ?Do not sit still or stand for a long time. ?Do not wear tight clothes. Do not wear garters on your upper legs. ?Exercise your legs. This can help the swelling go down. ?Wear compression stockings as told by your doctor. It is important that these are the right size. These should be prescribed by your doctor to prevent possible injuries. ?If elastic bandages or wraps are recommended, use them as told by your doctor. ?Contact a doctor if: ?Treatment is not working. ?You have heart, liver, or kidney disease and have symptoms of edema. ?You have sudden and unexplained weight gain. ?Get help right away if: ?You have shortness of breath or chest pain. ?You cannot breathe when you lie down. ?You have pain, redness, or warmth  in the swollen areas. ?You have heart, liver, or kidney disease and get edema all of a sudden. ?You have a fever and your symptoms get worse all of a sudden. ?These symptoms may be an emergency. Get help right away. Call 911. ?Do not wait to see if the symptoms will go away. ?Do not drive yourself to the hospital. ?Summary ?Edema is when you have too much fluid in your body or under your skin. ?Edema may make your legs, feet, and ankles swell. Swelling often happens in looser tissues, such as around your eyes. ?Raise the injured area above the level of your heart while you are sitting or lying down. ?Follow your doctor's instructions about diet and how much fluid you can drink. ?This information is not intended to replace advice given to you by your health care provider. Make sure you discuss any questions you have with your health care provider. ?Document Revised: 12/09/2020 Document Reviewed: 12/09/2020 ?Elsevier Patient Education ? 2022 [employer] ? ?

## 2021-07-02 ENCOUNTER — Encounter: Payer: Self-pay | Admitting: Family Medicine

## 2021-07-02 LAB — CMP14+EGFR
ALT: 35 IU/L (ref 0–44)
AST: 19 IU/L (ref 0–40)
Albumin/Globulin Ratio: 1.8 (ref 1.2–2.2)
Albumin: 4.7 g/dL (ref 3.8–4.9)
Alkaline Phosphatase: 115 IU/L (ref 44–121)
BUN/Creatinine Ratio: 15 (ref 9–20)
BUN: 14 mg/dL (ref 6–24)
Bilirubin Total: 0.3 mg/dL (ref 0.0–1.2)
CO2: 26 mmol/L (ref 20–29)
Calcium: 9.8 mg/dL (ref 8.7–10.2)
Chloride: 100 mmol/L (ref 96–106)
Creatinine, Ser: 0.96 mg/dL (ref 0.76–1.27)
Globulin, Total: 2.6 g/dL (ref 1.5–4.5)
Glucose: 242 mg/dL — ABNORMAL HIGH (ref 70–99)
Potassium: 4 mmol/L (ref 3.5–5.2)
Sodium: 138 mmol/L (ref 134–144)
Total Protein: 7.3 g/dL (ref 6.0–8.5)
eGFR: 95 mL/min/{1.73_m2} (ref >59)

## 2021-07-02 LAB — MICROALBUMIN / CREATININE URINE RATIO
Creatinine, Urine: 116.8 mg/dL
Microalb/Creat Ratio: 4 mg/g creat (ref 0–29)
Microalbumin, Urine: 4.2 ug/mL

## 2021-07-02 LAB — URINALYSIS
Bilirubin, UA: NEGATIVE
Ketones, UA: NEGATIVE
Leukocytes,UA: NEGATIVE
Nitrite, UA: NEGATIVE
Protein,UA: NEGATIVE
RBC, UA: NEGATIVE
Specific Gravity, UA: >=1.03 — AB (ref 1.005–1.030)
Urobilinogen, Ur: 0.2 mg/dL (ref 0.2–1.0)
pH, UA: 6 (ref 5.0–7.5)

## 2021-07-02 LAB — BRAIN NATRIURETIC PEPTIDE: BNP: 4.6 pg/mL (ref 0.0–100.0)

## 2021-07-03 ENCOUNTER — Ambulatory Visit: Payer: Medicaid Other | Admitting: Surgery

## 2021-07-03 ENCOUNTER — Encounter: Payer: Self-pay | Admitting: Physical Therapy

## 2021-07-03 ENCOUNTER — Ambulatory Visit: Payer: Medicaid Other | Admitting: Physical Therapy

## 2021-07-03 ENCOUNTER — Other Ambulatory Visit: Payer: Self-pay

## 2021-07-03 DIAGNOSIS — M5442 Lumbago with sciatica, left side: Secondary | ICD-10-CM | POA: Diagnosis not present

## 2021-07-03 NOTE — Patient Instructions (Signed)
Access Code: [email or web address] ?URL: [email or web address] ?Date: 07/03/2021 ?Prepared by: Rosana Hoes ? ?Exercises ?Supine Piriformis Stretch with Foot on Ground - 1 x daily - 3 reps - 30 seconds hold ?Supine Lower Trunk Rotation - 1 x daily - 10 reps - 5 seconds hold ?Supine Posterior Pelvic Tilt - 1 x daily - 2 sets - 5 reps - 5 seconds hold ?Straight Leg Raise - 1 x daily - 2 sets - 5 reps ?Clamshell - 1 x daily - 2 sets - 10 reps ?Seated Hamstring Stretch - 1 x daily - 3 reps - 30 seconds hold ? ?

## 2021-07-03 NOTE — Therapy (Signed)
?OUTPATIENT PHYSICAL THERAPY TREATMENT NOTE ? ? ?Patient Name: Victor [name] ?MRN: [id number] ?DOB:1970-09-05, 51 y.o., male ?Today's Date: 07/03/2021 ? ?PCP: Hoy Register, MD ?REFERRING PROVIDER: Hoy Register, MD ? ? PT End of Session - 07/03/21 0818   ? ? Visit Number 2   ? Number of Visits 8   ? Date for PT Re-Evaluation 08/19/21   ? Authorization Type [employer]   ? Authorization - Number of Visits 27   ? PT Start Time 0820   ? PT Stop Time 0900   ? PT Time Calculation (min) 40 min   ? Activity Tolerance Patient tolerated treatment well   ? Behavior During Therapy [facility] for tasks assessed/performed   ? ?  ?  ? ?  ? ? ?Past Medical History:  ?Diagnosis Date  ? Acute on chronic congestive heart failure (HCC)   ? Arthritis   ? Gout   ? Hay fever   ? High cholesterol   ? Hypertension   ? Mental health problem   ? Near syncope 12/27/2019  ? Pre-syncope 12/27/2019  ? Shortness of breath 02/06/2020  ? Syncope 12/26/2019  ? Type 2 diabetes mellitus without complication, without long-term current use of insulin (HCC)   ? Unstable angina (HCC) 02/06/2020  ? ?Past Surgical History:  ?Procedure Laterality Date  ? EYE SURGERY    ? HEMORROIDECTOMY    ? RIGHT/LEFT HEART CATH AND CORONARY ANGIOGRAPHY N/A 02/06/2020  ? Procedure: RIGHT/LEFT HEART CATH AND CORONARY ANGIOGRAPHY;  Surgeon: Marykay Lex, MD;  Location: [facility] INVASIVE CV LAB;  Service: Cardiovascular;  Laterality: N/A;  ? WISDOM TOOTH EXTRACTION    ? ?Patient Active Problem List  ? Diagnosis Date Noted  ? Anxiety 06/05/2021  ? Bedbug bite 05/13/2021  ? Localized edema 05/13/2021  ? PSVT (paroxysmal supraventricular tachycardia) (HCC) 02/19/2020  ? NSVT (nonsustained ventricular tachycardia) 02/19/2020  ? Obesity (BMI 30-39.9) 02/19/2020  ? Shortness of breath 02/06/2020  ? Unstable angina (HCC) 02/06/2020  ? Mental health problem   ? Hypertension   ? High cholesterol   ? Hay fever   ? Gout   ? Arthritis   ? Type 2 diabetes mellitus without complication, without long-term  current use of insulin (HCC)   ? Near syncope 12/27/2019  ? Pre-syncope 12/27/2019  ? Acute on chronic congestive heart failure (HCC)   ? Syncope 12/26/2019  ? ? ?REFERRING PROVIDER: Hoy Register, MD ?  ?REFERRING DIAG: Sciatica of left side ? ?THERAPY DIAG:  ?Chronic bilateral low back pain with left-sided sciatica ? ?Muscle weakness (generalized) ? ?PERTINENT HISTORY: DM II, CHF, HTN, BMI, LE edema, chronic hip/knee arthritis ? ?PRECAUTIONS: None ? ?SUBJECTIVE: Patient reports he is doing about the same, he has an MRI scheduled for next week. He has been trying to work on his exercises. ? ?PAIN:  ?Are you having pain? Yes ?NPRS scale: 8/10 ?Pain location: Back ?Pain orientation: Lower ?PAIN TYPE: Chronic ?Pain description: Constant, "charlie horse" ?Aggravating factors: Sitting or standing extended periods, bending ?Relieving factors: Nothing ? ?PATIENT GOALS: Find ways to improve pain ? ? ?OBJECTIVE:  ?PATIENT SURVEYS:  ?Modified Oswestry 25/50  ?  ?PALPATION: ?Tender to palpation left lumbar paraspinals, gluteal/piriformis regions with referral to posterior thigh ?  ?LUMBAR AROM ?  ?AROM A/PROM  ?06/24/2021  ?Flexion 75% - reports tightening of lower back and left hamstring  ?Extension 50% - reports midline low back pain  ?Right lateral flexion 75%  ?Left lateral flexion 75%  ?Right rotation 75%  ?Left rotation  75%  ?          All movement slow and hesitant ?  ?LE MMT: ?  ?MMT Right ?06/24/2021 Left ?06/24/2021 Lt ?07/03/2021  ?Core 3   ?Hip flexion 4- 4-   ?Hip extension 3 3- 3-  ?Hip abduction 3 3- 3-  ?Knee flexion 4 4   ?Knee extension 4 4   ?  ?LUMBAR SPECIAL TESTS:  ?Straight leg raise test: Positive ?  ?FUNCTIONAL TESTS:  ?Sit to stand: patient requires BUE for support to stand secondary to back, hip, knee, left leg pain ?SLS: unable bilaterally ?  ?GAIT: ?Assistive device utilized: Single point cane ?Level of assistance: Modified independence ?Comments: patient with decreased gait speed, wide BOS with  bilateral toe out ?  ?  ?TODAY'S TREATMENT ?[facility]                                                DATE: 07[facility]03/20[facility] ?Therapeutic Exercise: ?NuStep L6 x 5 min with UE/LE while taking subjective ?Piriformis stretch 2 x 30 sec each ?LTR 5 x 5 sec each ?Posterior pelvic tilt 5 x 5 sec, with belt and FR 5 x 5 sec ?SLR partial range 2 x 5 each ?Hooklying clamshell with blue 2 x 10 ? ? ?[facility]                                                DATE: 06/24/2021 ?Therapeutic Exercise: ?Piriformis stretch 2 x 30 sec (left only) ?LTR 3 x 5 sec each ?Posterior pelvic tilt 5 x 5 sec ?Side clamshell x 10 ?Seated hamstring stretch 2 x 30 sec ?  ?PATIENT EDUCATION:  ?Education details: HEP update ?Person educated: Patient ?Education method: Explanation, Demonstration, Tactile cues, Verbal cues, and Handouts ?Education comprehension: verbalized understanding, returned demonstration, verbal cues required, tactile cues required, and needs further education ?  ?HOME EXERCISE PROGRAM: ?Access Code: [email or web address] ?  ?  ?ASSESSMENT: ?CLINICAL IMPRESSION: ?Patient tolerated therapy much better this visit with no adverse effects. He deferred dry needling this visit due to fear of needles. Therapy focused on continued stretching for lumbar and hips, and progression of core and hip strengthening. He was able to tolerate and complete light core stabilization but with repeated complaints of increased pain and he did demonstrate increased difficulty with exercises on left. Max cueing needed for proper technique with pelvic tilt and abdominal activation. Update HEP to progress core stabilization exercises. Patient would benefit from continued skilled PT to progress his mobility and strength in order to reduce pain and maximize functional ability. ?  ?  ?OBJECTIVE IMPAIRMENTS Abnormal gait, decreased activity tolerance, decreased balance, difficulty walking, decreased ROM, decreased strength, impaired flexibility, impaired  sensation, improper body mechanics, postural dysfunction, and pain.  ?  ?ACTIVITY LIMITATIONS cleaning, community activity, driving, meal prep, occupation, laundry, yard work, and shopping.  ?  ?PERSONAL FACTORS Fitness, Past/current experiences, and Time since onset of injury/illness/exacerbation are also affecting patient's functional outcome.  ?  ?  ?GOALS: ?Goals reviewed with patient? Yes ?  ?SHORT TERM GOALS: ?  ?Patient will be I with initial HEP in order to progress with therapy. ?Baseline: HEP provided at evaluation ?Target date: 07/22/2021 ?Goal status: INITIAL ?  ?  2.  Patient will report pain level </= 6/10 in order to reduce functional limitations with household tasks and lifting. ?Baseline: 8-9/10 pain level ?Target date: 07/22/2021 ?Goal status: INITIAL ?  ?LONG TERM GOALS: ?  ?Patient will be I with final HEP to maintain progress from PT. ?Baseline: HEP provided at evaluation ?Target date: 08/19/2021 ?Goal status: INITIAL ?  ?2.  Patient will report </= 15/50 on Modified ODI in order to indicate improved functional ability ?Baseline: 25/50 Modified ODI ?Target date: 08/19/2021 ?Goal status: INITIAL ?  ?3.  Patient will demonstrate lumbar AROM without increase in pain in order to improve ability to perform household tasks. ?Baseline: patient reports increase in low back and left leg pain primarily with flexion and extension movements ?Target date: 08/19/2021 ?Goal status: INITIAL ?  ?4.  Patient will demonstrate >/= 4-/5 MMT of core and hip strength in order to improve standing and walking tolerance ?Baseline: patient demonstrates gross limitations in core and hip strength (see above) ?Target date: 08/19/2021 ?Goal status: INITIAL ?  ?  ?PLAN: ?PT FREQUENCY: 1x/week ?  ?PT DURATION: 8 weeks ?  ?PLANNED INTERVENTIONS: Therapeutic exercises, Therapeutic activity, Neuromuscular re-education, Balance training, Gait training, Patient/Family education, Joint manipulation, Joint mobilization, Aquatic Therapy, Dry  Needling, Electrical stimulation, Spinal manipulation, Spinal mobilization, Cryotherapy, Moist heat, and Manual therapy ?  ?PLAN FOR NEXT SESSION: Review HEP and progress PRN, manual/dry needling for left lumbar and

## 2021-07-09 ENCOUNTER — Ambulatory Visit
Admission: RE | Admit: 2021-07-09 | Discharge: 2021-07-09 | Disposition: A | Discharge status: Home / Self Care | Payer: Medicaid Other | Source: Ambulatory Visit | Attending: Surgery | Admitting: Surgery

## 2021-07-09 ENCOUNTER — Other Ambulatory Visit: Payer: Self-pay

## 2021-07-09 DIAGNOSIS — M4726 Other spondylosis with radiculopathy, lumbar region: Secondary | ICD-10-CM

## 2021-07-10 ENCOUNTER — Encounter: Payer: Self-pay | Admitting: Physical Therapy

## 2021-07-10 ENCOUNTER — Ambulatory Visit: Payer: Medicaid Other | Admitting: Physical Therapy

## 2021-07-10 DIAGNOSIS — G8929 Other chronic pain: Secondary | ICD-10-CM

## 2021-07-10 DIAGNOSIS — M6281 Muscle weakness (generalized): Secondary | ICD-10-CM

## 2021-07-10 DIAGNOSIS — M5442 Lumbago with sciatica, left side: Secondary | ICD-10-CM | POA: Diagnosis not present

## 2021-07-10 NOTE — Therapy (Signed)
?OUTPATIENT PHYSICAL THERAPY TREATMENT NOTE ? ? ?Patient Name: Victor [name] ?MRN: [id number] ?DOB:1970-07-28, 51 y.o., male ?Today's Date: 07/10/2021 ? ?PCP: Charlott Rakes, MD ?REFERRING PROVIDER: Charlott Rakes, MD ? ? PT End of Session - 07/10/21 0805   ? ? Visit Number 3   ? Number of Visits 8   ? Date for PT Re-Evaluation 08/19/21   ? Authorization Type [employer]   ? Authorization - Number of Visits 27   ? PT Start Time [phone]   ? PT Stop Time [phone]   ? PT Time Calculation (min) 39 min   ? ?  ?  ? ?  ? ? ?Past Medical History:  ?Diagnosis Date  ? Acute on chronic congestive heart failure ([place])   ? Arthritis   ? Gout   ? Hay fever   ? High cholesterol   ? Hypertension   ? Mental health problem   ? Near syncope 12/27/2019  ? Pre-syncope 12/27/2019  ? Shortness of breath 02/06/2020  ? Syncope 12/26/2019  ? Type 2 diabetes mellitus without complication, without long-term current use of insulin ([place])   ? Unstable angina ([place]) 02/06/2020  ? ?Past Surgical History:  ?Procedure Laterality Date  ? EYE SURGERY    ? HEMORROIDECTOMY    ? RIGHT/LEFT HEART CATH AND CORONARY ANGIOGRAPHY N/A 02/06/2020  ? Procedure: RIGHT/LEFT HEART CATH AND CORONARY ANGIOGRAPHY;  Surgeon: Leonie Man, MD;  Location: [place] CV LAB;  Service: Cardiovascular;  Laterality: N/A;  ? WISDOM TOOTH EXTRACTION    ? ?Patient Active Problem List  ? Diagnosis Date Noted  ? Anxiety 06/05/2021  ? Bedbug bite 05/13/2021  ? Localized edema 05/13/2021  ? PSVT (paroxysmal supraventricular tachycardia) ([place]) 02/19/2020  ? NSVT (nonsustained ventricular tachycardia) 02/19/2020  ? Obesity (BMI 30-39.9) 02/19/2020  ? Shortness of breath 02/06/2020  ? Unstable angina ([place]) 02/06/2020  ? Mental health problem   ? Hypertension   ? High cholesterol   ? Hay fever   ? Gout   ? Arthritis   ? Type 2 diabetes mellitus without complication, without long-term current use of insulin ([place])   ? Near syncope 12/27/2019  ? Pre-syncope 12/27/2019  ? Acute on chronic congestive  heart failure ([place])   ? Syncope 12/26/2019  ? ? ?REFERRING PROVIDER: Charlott Rakes, MD ?  ?REFERRING DIAG: Sciatica of left side ? ?THERAPY DIAG:  ?Chronic bilateral low back pain with left-sided sciatica ? ?Muscle weakness (generalized) ? ?PERTINENT HISTORY: DM II, CHF, HTN, BMI, LE edema, chronic hip/knee arthritis ? ?PRECAUTIONS: None ? ?SUBJECTIVE: I was a sore in my hips after last session. I am doing a few reps of the exercises but I get tired. I am waiting on the MRI results.  ? ?PAIN:  ?Are you having pain? Yes ?NPRS scale: 6/10 ?Pain location: Back ?Pain orientation: Lower ?PAIN TYPE: Chronic ?Pain description: Constant, "charlie horse" ?Aggravating factors: Sitting or standing extended periods, bending ?Relieving factors: Nothing ? ?PATIENT GOALS: Find ways to improve pain ? ? ?OBJECTIVE:  ?PATIENT SURVEYS:  ?Modified Oswestry 25/50  ?  ?PALPATION: ?Tender to palpation left lumbar paraspinals, gluteal/piriformis regions with referral to posterior thigh ?  ?LUMBAR AROM ?  ?AROM A/PROM  ?06/24/2021  ?Flexion 75% - reports tightening of lower back and left hamstring  ?Extension 50% - reports midline low back pain  ?Right lateral flexion 75%  ?Left lateral flexion 75%  ?Right rotation 75%  ?Left rotation 75%  ?          All  movement slow and hesitant ?  ?LE MMT: ?  ?MMT Right ?06/24/2021 Left ?06/24/2021 Lt ?07/03/2021  ?Core 3   ?Hip flexion 4- 4-   ?Hip extension 3 3- 3-  ?Hip abduction 3 3- 3-  ?Knee flexion 4 4   ?Knee extension 4 4   ?  ?LUMBAR SPECIAL TESTS:  ?Straight leg raise test: Positive ?  ?FUNCTIONAL TESTS:  ?Sit to stand: patient requires BUE for support to stand secondary to back, hip, knee, left leg pain ?SLS: unable bilaterally ?  ?GAIT: ?Assistive device utilized: Single point cane ?Level of assistance: Modified independence ?Comments: patient with decreased gait speed, wide BOS with bilateral toe out ?  ?  ?TODAY'S TREATMENT ?[facility] Adult PT Treatment:                                                 DATE: 07/10/2021 ?Therapeutic Exercise: ?NuStep L5 x 5 min with UE/LE while taking subjective ?STS x 10 from mat table with airex  ?Seated hamstring stretch 30 sec x 2 each ?S/L clam 10 x 2 each  ?PPT 5 sec x 10  ?Bridge x 10 ?Piriformis stretch 2 x 30 sec each ?SLR 2 x 10 each  ? ? ?[facility] [facility] Adult PT Treatment:                                                DATE: 07/03/2021 ?Therapeutic Exercise: ?NuStep L6 x 5 min with UE/LE while taking subjective ?Piriformis stretch 2 x 30 sec each ?LTR 5 x 5 sec each ?Posterior pelvic tilt 5 x 5 sec, with belt and FR 5 x 5 sec ?SLR partial range 2 x 5 each ?Hooklying clamshell with blue 2 x 10 ? ? ?[facility] Adult PT Treatment:                                                DATE: 06/24/2021 ?Therapeutic Exercise: ?Piriformis stretch 2 x 30 sec (left only) ?LTR 3 x 5 sec each ?Posterior pelvic tilt 5 x 5 sec ?Side clamshell x 10 ?Seated hamstring stretch 2 x 30 sec ?  ?PATIENT EDUCATION:  ?Education details: HEP update ?Person educated: Patient ?Education method: Explanation, Demonstration, Tactile cues, Verbal cues, and Handouts ?Education comprehension: verbalized understanding, returned demonstration, verbal cues required, tactile cues required, and needs further education ?  ?HOME EXERCISE PROGRAM: ?Access Code: [email or web address] ?  ?  ?ASSESSMENT: ?CLINICAL IMPRESSION: ?Patient demonstrates improved tolerance to increased reps of therex today. He reports lower level back pain today due to resting mostly for the last few days. He is waiting on MRI results.  ?  ?  ?OBJECTIVE IMPAIRMENTS Abnormal gait, decreased activity tolerance, decreased balance, difficulty walking, decreased ROM, decreased strength, impaired flexibility, impaired sensation, improper body mechanics, postural dysfunction, and pain.  ?  ?ACTIVITY LIMITATIONS cleaning, community activity, driving, meal prep, occupation, laundry, yard work, and shopping.  ?  ?PERSONAL FACTORS Fitness, Past/current experiences, and Time since onset  of injury/illness/exacerbation are also affecting patient's functional outcome.  ?  ?  ?GOALS: ?Goals reviewed with patient? Yes ?  ?SHORT TERM  GOALS: ?  ?Patient will be I with initial HEP in order to progress with therapy. ?Baseline: HEP provided at evaluation ?Target date: 07/22/2021 ?Goal status: INITIAL ?  ?2.  Patient will report pain level </= 6/10 in order to reduce functional limitations with household tasks and lifting. ?Baseline: 8-9/10 pain level ?Target date: 07/22/2021 ?Goal status: INITIAL ?  ?LONG TERM GOALS: ?  ?Patient will be I with final HEP to maintain progress from PT. ?Baseline: HEP provided at evaluation ?Target date: 08/19/2021 ?Goal status: INITIAL ?  ?2.  Patient will report </= 15/50 on Modified ODI in order to indicate improved functional ability ?Baseline: 25/50 Modified ODI ?Target date: 08/19/2021 ?Goal status: INITIAL ?  ?3.  Patient will demonstrate lumbar AROM without increase in pain in order to improve ability to perform household tasks. ?Baseline: patient reports increase in low back and left leg pain primarily with flexion and extension movements ?Target date: 08/19/2021 ?Goal status: INITIAL ?  ?4.  Patient will demonstrate >/= 4-/5 MMT of core and hip strength in order to improve standing and walking tolerance ?Baseline: patient demonstrates gross limitations in core and hip strength (see above) ?Target date: 08/19/2021 ?Goal status: INITIAL ?  ?  ?PLAN: ?PT FREQUENCY: 1x/week ?  ?PT DURATION: 8 weeks ?  ?PLANNED INTERVENTIONS: Therapeutic exercises, Therapeutic activity, Neuromuscular re-education, Balance training, Gait training, Patient/Family education, Joint manipulation, Joint mobilization, Aquatic Therapy, Dry Needling, Electrical stimulation, Spinal manipulation, Spinal mobilization, Cryotherapy, Moist heat, and Manual therapy ?  ?PLAN FOR NEXT SESSION: Review HEP and progress PRN, manual/dry needling for left lumbar and hip region, lumbar and hip flexibility, progress core  stabilization and hip strengthening ? ? ? ?Hessie Diener, PTA ?07/10/21 8:42 AM ?Phone: [phone] ?Fax: [phone]  ?  ? ? ?

## 2021-07-17 ENCOUNTER — Ambulatory Visit: Payer: Medicaid Other | Admitting: Physical Therapy

## 2021-07-17 DIAGNOSIS — M5442 Lumbago with sciatica, left side: Secondary | ICD-10-CM | POA: Diagnosis not present

## 2021-07-17 DIAGNOSIS — G8929 Other chronic pain: Secondary | ICD-10-CM

## 2021-07-17 DIAGNOSIS — M6281 Muscle weakness (generalized): Secondary | ICD-10-CM

## 2021-07-17 NOTE — Therapy (Signed)
?OUTPATIENT PHYSICAL THERAPY TREATMENT NOTE ? ? ?Patient Name: Victor [name] ?MRN: [id number] ?DOB:September 27, 1970, 51 y.o., male ?Today's Date: 07/17/2021 ? ?PCP: Hoy Register, MD ?REFERRING PROVIDER: Hoy Register, MD ? ? PT End of Session - 07/17/21 0853   ? ? Visit Number 4   ? Number of Visits 8   ? Date for PT Re-Evaluation 08/19/21   ? Authorization Type [employer]   ? Authorization - Number of Visits 27   ? PT Start Time [phone]   ? PT Stop Time 0929   ? PT Time Calculation (min) 42 min   ? ?  ?  ? ?  ? ? ?Past Medical History:  ?Diagnosis Date  ? Acute on chronic congestive heart failure (HCC)   ? Arthritis   ? Gout   ? Hay fever   ? High cholesterol   ? Hypertension   ? Mental health problem   ? Near syncope 12/27/2019  ? Pre-syncope 12/27/2019  ? Shortness of breath 02/06/2020  ? Syncope 12/26/2019  ? Type 2 diabetes mellitus without complication, without long-term current use of insulin (HCC)   ? Unstable angina (HCC) 02/06/2020  ? ?Past Surgical History:  ?Procedure Laterality Date  ? EYE SURGERY    ? HEMORROIDECTOMY    ? RIGHT/LEFT HEART CATH AND CORONARY ANGIOGRAPHY N/A 02/06/2020  ? Procedure: RIGHT/LEFT HEART CATH AND CORONARY ANGIOGRAPHY;  Surgeon: Marykay Lex, MD;  Location: [facility] INVASIVE CV LAB;  Service: Cardiovascular;  Laterality: N/A;  ? WISDOM TOOTH EXTRACTION    ? ?Patient Active Problem List  ? Diagnosis Date Noted  ? Anxiety 06/05/2021  ? Bedbug bite 05/13/2021  ? Localized edema 05/13/2021  ? PSVT (paroxysmal supraventricular tachycardia) (HCC) 02/19/2020  ? NSVT (nonsustained ventricular tachycardia) 02/19/2020  ? Obesity (BMI 30-39.9) 02/19/2020  ? Shortness of breath 02/06/2020  ? Unstable angina (HCC) 02/06/2020  ? Mental health problem   ? Hypertension   ? High cholesterol   ? Hay fever   ? Gout   ? Arthritis   ? Type 2 diabetes mellitus without complication, without long-term current use of insulin (HCC)   ? Near syncope 12/27/2019  ? Pre-syncope 12/27/2019  ? Acute on chronic congestive  heart failure (HCC)   ? Syncope 12/26/2019  ? ? ?REFERRING PROVIDER: Hoy Register, MD ?  ?REFERRING DIAG: Sciatica of left side ? ?THERAPY DIAG:  ?Chronic bilateral low back pain with left-sided sciatica ? ?Muscle weakness (generalized) ? ?PERTINENT HISTORY: DM II, CHF, HTN, BMI, LE edema, chronic hip/knee arthritis ? ?PRECAUTIONS: None ? ?SUBJECTIVE: I feel like I have ice packs on my lower back and hip area. My hands are aching. I am going the exercises. I see MD tomorrow to discuss MRI results.  ? ?PAIN:  ?Are you having pain? Yes ?NPRS scale: 8/10 ?Pain location: Back ?Pain orientation: Lower ?PAIN TYPE: Chronic ?Pain description: Constant, "charlie horse" ?Aggravating factors: Sitting or standing extended periods, bending ?Relieving factors: Nothing ? ?PATIENT GOALS: Find ways to improve pain ? ? ?OBJECTIVE:  ?PATIENT SURVEYS:  ?Modified Oswestry 25/50  ?  ?PALPATION: ?Tender to palpation left lumbar paraspinals, gluteal/piriformis regions with referral to posterior thigh ?  ?LUMBAR AROM ?  ?AROM A/PROM  ?06/24/2021  ?Flexion 75% - reports tightening of lower back and left hamstring  ?Extension 50% - reports midline low back pain  ?Right lateral flexion 75%  ?Left lateral flexion 75%  ?Right rotation 75%  ?Left rotation 75%  ?  All movement slow and hesitant ?  ?LE MMT: ?  ?MMT Right ?06/24/2021 Left ?06/24/2021 Lt ?07/03/2021  ?Core 3   ?Hip flexion 4- 4-   ?Hip extension 3 3- 3-  ?Hip abduction 3 3- 3-  ?Knee flexion 4 4   ?Knee extension 4 4   ?  ?LUMBAR SPECIAL TESTS:  ?Straight leg raise test: Positive ?  ?FUNCTIONAL TESTS:  ?Sit to stand: patient requires BUE for support to stand secondary to back, hip, knee, left leg pain ?SLS: unable bilaterally ?  ?GAIT: ?Assistive device utilized: Single point cane ?Level of assistance: Modified independence ?Comments: patient with decreased gait speed, wide BOS with bilateral toe out ?  ?  ?TODAY'S TREATMENT ?[facility]                                                 DATE: 07/17/2021 ?Therapeutic Exercise: ?NuStep L5 x 5 min with UE/LE while taking subjective ?STS 2 x 10 from mat table with airex  ?LAQ 5# 10x2 ?Supine hamstring stretch ?Bridge x 10 ?SLR 3 x 10 each 2# cues for abdominal brace ? ?[facility]:                                                DATE: 07/10/2021 ?Therapeutic Exercise: ?NuStep L5 x 5 min with UE/LE while taking subjective ?STS x 10 from mat table with airex  ?Seated hamstring stretch 30 sec x 2 each ?S/L clam 10 x 2 each  ?PPT 5 sec x 10  ?Bridge x 10 ?Piriformis stretch 2 x 30 sec each ?SLR 2 x 10 each  ? ? ?[facility] Adult PT Treatment:                                                DATE: 07/03/2021 ?Therapeutic Exercise: ?NuStep L6 x 5 min with UE/LE while taking subjective ?Piriformis stretch 2 x 30 sec each ?LTR 5 x 5 sec each ?Posterior pelvic tilt 5 x 5 sec, with belt and FR 5 x 5 sec ?SLR partial range 2 x 5 each ?Hooklying clamshell with blue 2 x 10 ? ? ?[facility]                                                DATE: 06/24/2021 ?Therapeutic Exercise: ?Piriformis stretch 2 x 30 sec (left only) ?LTR 3 x 5 sec each ?Posterior pelvic tilt 5 x 5 sec ?Side clamshell x 10 ?Seated hamstring stretch 2 x 30 sec ?  ?PATIENT EDUCATION:  ?Education details: HEP update ?Person educated: Patient ?Education method: Explanation, Demonstration, Tactile cues, Verbal cues, and Handouts ?Education comprehension: verbalized understanding, returned demonstration, verbal cues required, tactile cues required, and needs further education ?  ?HOME EXERCISE PROGRAM: ?Access Code: [email or web address] ?  ?  ?ASSESSMENT: ?CLINICAL IMPRESSION: ?Patient reports compliance with HEP. He has difficulty rising from standard chairs, requiring UE assist. His hands are painful and  he pushes with his forearm and leans to the side. Continued with sit-stand from elevated surface as well Knee, hip and core strength. He reported his back was sore at end of session. He  will meet with MD tomorrow to discuss MRI results.  ?  ?  ?OBJECTIVE IMPAIRMENTS Abnormal gait, decreased activity tolerance, decreased balance, difficulty walking, decreased ROM, decreased strength, impaired flexibility, impaired sensation, improper body mechanics, postural dysfunction, and pain.  ?  ?ACTIVITY LIMITATIONS cleaning, community activity, driving, meal prep, occupation, laundry, yard work, and shopping.  ?  ?PERSONAL FACTORS Fitness, Past/current experiences, and Time since onset of injury/illness/exacerbation are also affecting patient's functional outcome.  ?  ?  ?GOALS: ?Goals reviewed with patient? Yes ?  ?SHORT TERM GOALS: ?  ?Patient will be I with initial HEP in order to progress with therapy. ?Baseline: HEP provided at evaluation ?Target date: 07/22/2021 ?Goal status: INITIAL ?  ?2.  Patient will report pain level </= 6/10 in order to reduce functional limitations with household tasks and lifting. ?Baseline: 8-9/10 pain level ?Target date: 07/22/2021 ?Goal status: INITIAL ?  ?LONG TERM GOALS: ?  ?Patient will be I with final HEP to maintain progress from PT. ?Baseline: HEP provided at evaluation ?Target date: 08/19/2021 ?Goal status: INITIAL ?  ?2.  Patient will report </= 15/50 on Modified ODI in order to indicate improved functional ability ?Baseline: 25/50 Modified ODI ?Target date: 08/19/2021 ?Goal status: INITIAL ?  ?3.  Patient will demonstrate lumbar AROM without increase in pain in order to improve ability to perform household tasks. ?Baseline: patient reports increase in low back and left leg pain primarily with flexion and extension movements ?Target date: 08/19/2021 ?Goal status: INITIAL ?  ?4.  Patient will demonstrate >/= 4-/5 MMT of core and hip strength in order to improve standing and walking tolerance ?Baseline: patient demonstrates gross limitations in core and hip strength (see above) ?Target date: 08/19/2021 ?Goal status: INITIAL ?  ?  ?PLAN: ?PT FREQUENCY: 1x/week ?  ?PT DURATION: 8  weeks ?  ?PLANNED INTERVENTIONS: Therapeutic exercises, Therapeutic activity, Neuromuscular re-education, Balance training, Gait training, Patient/Family education, Joint manipulation, Joint mobilization,

## 2021-07-18 ENCOUNTER — Ambulatory Visit: Payer: Medicaid Other | Admitting: Endocrinology

## 2021-07-18 ENCOUNTER — Encounter: Payer: Self-pay | Admitting: Specialist

## 2021-07-18 ENCOUNTER — Ambulatory Visit (INDEPENDENT_AMBULATORY_CARE_PROVIDER_SITE_OTHER): Payer: Medicaid Other | Admitting: Specialist

## 2021-07-18 VITALS — BP 112/83 | HR 69 | Ht 71.0 in | Wt 261.0 lb

## 2021-07-18 DIAGNOSIS — M48062 Spinal stenosis, lumbar region with neurogenic claudication: Secondary | ICD-10-CM | POA: Diagnosis not present

## 2021-07-18 DIAGNOSIS — M5136 Other intervertebral disc degeneration, lumbar region: Secondary | ICD-10-CM | POA: Diagnosis not present

## 2021-07-18 DIAGNOSIS — M5116 Intervertebral disc disorders with radiculopathy, lumbar region: Secondary | ICD-10-CM | POA: Diagnosis not present
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
## 2021-07-18 NOTE — Patient Instructions (Signed)
Plan: Avoid frequent bending and stooping  ?No lifting greater than 10 lbs. ?May use ice or moist heat for pain. ?Weight loss is of benefit. ? ?Exercise is important to improve your indurance and does allow people to function better inspite of back pain. ?You have a lumbar disc herniation with weakness on exam left side more than right. ?Avoid bending, stooping and avoid lifting weights greater than 10 lbs. ?Avoid prolong standing and walking. ?Order for a new walker with wheels. ?Surgery scheduling secretary Tivis Ringer, will call you in the next week to schedule for surgery.  ?Will need cardiology evaluation or risk concerning surgical intervention. ?Surgery recommended is a one level lumbar Microdiscectomy left L3-4  this would be done with the OR microscope. ?Continue with pain management. ?Risk of surgery includes risk of infection 1 in 200 patients, bleeding 1/2% chance you would need a transfusion.   Risk to the nerves is one in 10,000. ? ?Expect improved walking and standing tolerance. Expect relief of leg pain but numbness ?may persist depending on the length and degree of pressure that has been present. ?

## 2021-07-22 ENCOUNTER — Telehealth: Payer: Self-pay | Admitting: *Deleted

## 2021-07-22 NOTE — Telephone Encounter (Signed)
? ?  Pre-operative Risk Assessment  ?  ?Patient Name: Victor [name]  ?DOB: 1970/12/22 ?MRN: [id number]  ? ?  ? ?Request for Surgical Clearance   ? ?Procedure:   LUMBAR MICRODISCECTOMY ?  ?Date of Surgery:  Clearance TBD                              ?   ?Surgeon:  DR. Basil Dess ?Surgeon's Group or Practice Name:  [facility] ?Phone number:  [phone] ?Fax number:  [phone] ?  ?Type of Clearance Requested:   ?- Medical  ?  ?Type of Anesthesia:  General  ?  ?Additional requests/questions:   ? ?Signed, ?Jeanann Lewandowsky   ?07/22/2021, 11:54 AM  ? ?

## 2021-07-22 NOTE — Therapy (Addendum)
?OUTPATIENT PHYSICAL THERAPY TREATMENT NOTE ? ?DISCHARGE ? ? ?Patient Name: Victor [name] ?MRN: [id number] ?DOB:Apr 27, 1970, 51 y.o., male ?Today's Date: 07/24/2021 ? ?PCP: Charlott Rakes, MD ?REFERRING PROVIDER: Charlott Rakes, MD ? ? PT End of Session - 07/24/21 [id number]   ? ? Visit Number 5   ? Number of Visits 8   ? Date for PT Re-Evaluation 08/19/21   ? Authorization Type [facility] [employer]   ? Authorization - Number of Visits 27   ? PT Start Time 0825   ? PT Stop Time [id number]   ? PT Time Calculation (min) 40 min   ? Activity Tolerance Patient tolerated treatment well   ? Behavior During Therapy [facility] for tasks assessed/performed   ? ?  ?  ? ?  ? ? ? ?Past Medical History:  ?Diagnosis Date  ? Acute on chronic congestive heart failure ([place])   ? Arthritis   ? Gout   ? Hay fever   ? High cholesterol   ? Hypertension   ? Mental health problem   ? Near syncope 12/27/2019  ? Pre-syncope 12/27/2019  ? Shortness of breath 02/06/2020  ? Syncope 12/26/2019  ? Type 2 diabetes mellitus without complication, without long-term current use of insulin ([place])   ? Unstable angina ([place]) 02/06/2020  ? ?Past Surgical History:  ?Procedure Laterality Date  ? EYE SURGERY    ? HEMORROIDECTOMY    ? RIGHT/LEFT HEART CATH AND CORONARY ANGIOGRAPHY N/A 02/06/2020  ? Procedure: RIGHT/LEFT HEART CATH AND CORONARY ANGIOGRAPHY;  Surgeon: Leonie Man, MD;  Location: [place] [facility];  Service: Cardiovascular;  Laterality: N/A;  ? WISDOM TOOTH EXTRACTION    ? ?Patient Active Problem List  ? Diagnosis Date Noted  ? Anxiety 06/05/2021  ? Bedbug bite 05/13/2021  ? Localized edema 05/13/2021  ? PSVT (paroxysmal supraventricular tachycardia) ([place]) 02/19/2020  ? NSVT (nonsustained ventricular tachycardia) ([place]) 02/19/2020  ? Obesity (BMI 30-39.9) 02/19/2020  ? Shortness of breath 02/06/2020  ? Unstable angina ([place]) 02/06/2020  ? Mental health problem   ? Hypertension   ? High cholesterol   ? Hay fever   ? Gout   ? Arthritis   ? Type 2 diabetes mellitus without complication,  without long-term current use of insulin ([place])   ? Near syncope 12/27/2019  ? Pre-syncope 12/27/2019  ? Acute on chronic congestive heart failure ([place])   ? Syncope 12/26/2019  ? ? ?REFERRING PROVIDER: Charlott Rakes, MD ?  ?REFERRING DIAG: Sciatica of left side ? ?THERAPY DIAG:  ?Chronic bilateral low back pain with left-sided sciatica ? ?Muscle weakness (generalized) ? ?PERTINENT HISTORY: DM II, CHF, HTN, BMI, LE edema, chronic hip/knee arthritis ? ?PRECAUTIONS: None ? ?SUBJECTIVE: Pain reports continued low back pain. States he saw his doctor and they are talking about surgery. He still has trouble with standing and sitting without using his hands. ? ?PAIN:  ?Are you having pain? Yes ?NPRS scale: 8-9/10 ?Pain location: Left lower back and buttock region ?PAIN TYPE: Chronic ?Pain description: Constant, "charlie horse" ?Aggravating factors: Sitting or standing extended periods, bending ?Relieving factors: Nothing ? ?PATIENT GOALS: Find ways to improve pain ? ? ?OBJECTIVE:  ?PATIENT SURVEYS:  ?Modified Oswestry 25/50  ?  ?PALPATION: ?Tender to palpation left lumbar paraspinals, gluteal/piriformis regions with referral to posterior thigh ?  ?LUMBAR AROM ?  ?AROM A/PROM  ?06/24/2021  ?07/24/2021  ?Flexion 75% - reports tightening of lower back and left hamstring   ?Extension 50% - reports midline low back pain 50%  ?Right lateral  flexion 75%   ?Left lateral flexion 75%   ?Right rotation 75%   ?Left rotation 75%   ?          All movement slow and hesitant ?  ?LE MMT: ?  ?MMT Right ?06/24/2021 Left ?06/24/2021 Lt ?07/03/2021  ?07/24/2021  ?Core 3  3  ?Hip flexion 4- 4-    ?Hip extension 3 3- 3-   ?Hip abduction 3 3- 3-   ?Knee flexion 4 4    ?Knee extension 4 4    ?  ?LUMBAR SPECIAL TESTS:  ?Straight leg raise test: Positive ?  ?FUNCTIONAL TESTS:  ?Sit to stand: patient requires BUE for support to stand secondary to back, hip, knee, left leg pain ?SLS: unable bilaterally ?  ?GAIT: ?Assistive device utilized: Single point  cane ?Level of assistance: Modified independence ?Comments: patient with decreased gait speed, wide BOS with bilateral toe out ?  ?  ?TODAY'S TREATMENT ?[facility] Adult PT Treatment:                                                DATE: 07/24/2021 ?Therapeutic Exercise: ?NuStep L6 x 5 min with UE/LE while taking subjective ?Prone press up on elbows 3 x 10 ?Supine piriformis stretch pull/push 2 x 30 sec each (left only) ?PPT 10 x 5 sec ?Hooklying clamshell with green x 10 ?Bridge 5 x 5 sec ?Manual: ?LAD with belt for LLE x 3 bouts at various ranges of hip flexion ?DTM/TPR for left gluteal and piriformis region ? ? ?[facility] Adult PT Treatment:                                                DATE: 07/17/2021 ?Therapeutic Exercise: ?NuStep L5 x 5 min with UE/LE while taking subjective ?STS 2 x 10 from mat table with airex  ?LAQ 5# 10x2 ?Supine hamstring stretch ?Bridge x 10 ?SLR 3 x 10 each 2# cues for abdominal brace ? ?[facility] Adult PT Treatment:                                                DATE: 07/10/2021 ?Therapeutic Exercise: ?NuStep L5 x 5 min with UE/LE while taking subjective ?STS x 10 from mat table with airex  ?Seated hamstring stretch 30 sec x 2 each ?S/L clam 10 x 2 each  ?PPT 5 sec x 10  ?Bridge x 10 ?Piriformis stretch 2 x 30 sec each ?SLR 2 x 10 each  ?  ?PATIENT EDUCATION:  ?Education details: HEP ?Person educated: Patient ?Education method: Explanation, Demonstration, Tactile cues, Verbal cues ?Education comprehension: verbalized understanding, returned demonstration, verbal cues required, tactile cues required, and needs further education ?  ?HOME EXERCISE PROGRAM: ?Access Code: [email or web address] ?  ?  ?ASSESSMENT: ?CLINICAL IMPRESSION: ?Patient continues to be limited secondary to pain in therapy, no adverse effects reported. Trialed using manual therapy for pain reduction and improve mobility, patient initially reporting improvement but transient as pain returned. Also trialed prone press up for centralization but patient  continues to report no real change in symptoms. He continues to demonstrate poor core activation and  hip strength. No changes to HEP this visit. Patient would benefit from continued skilled PT to progress his mobility and strength in order to reduce pain and maximize functional ability. ?  ?  ?OBJECTIVE IMPAIRMENTS Abnormal gait, decreased activity tolerance, decreased balance, difficulty walking, decreased ROM, decreased strength, impaired flexibility, impaired sensation, improper body mechanics, postural dysfunction, and pain.  ?  ?ACTIVITY LIMITATIONS cleaning, community activity, driving, meal prep, occupation, laundry, yard work, and shopping.  ?  ?PERSONAL FACTORS Fitness, Past/current experiences, and Time since onset of injury/illness/exacerbation are also affecting patient's functional outcome.  ?  ?  ?GOALS: ?Goals reviewed with patient? Yes ?  ?SHORT TERM GOALS: ?  ?Patient will be I with initial HEP in order to progress with therapy. ?Baseline: HEP provided at evaluation ?Target date: 07/22/2021 ?07/24/2021: continues to require cues for exercises ?Goal status: ONGOING ?  ?2.  Patient will report pain level </= 6/10 in order to reduce functional limitations with household tasks and lifting. ?Baseline: 8-9/10 pain level ?07/24/2021: patient reports 8-9/10 pain ?Target date: 07/22/2021 ?Goal status: ONGOING ?  ?LONG TERM GOALS: ?  ?Patient will be I with final HEP to maintain progress from PT. ?Baseline: HEP provided at evaluation ?Target date: 08/19/2021 ?Goal status: INITIAL ?  ?2.  Patient will report </= 15/50 on Modified ODI in order to indicate improved functional ability ?Baseline: 25/50 Modified ODI ?Target date: 08/19/2021 ?Goal status: INITIAL ?  ?3.  Patient will demonstrate lumbar AROM without increase in pain in order to improve ability to perform household tasks. ?Baseline: patient reports increase in low back and left leg pain primarily with flexion and extension movements ?Target date: 08/19/2021 ?Goal  status: INITIAL ?  ?4.  Patient will demonstrate >/= 4-/5 MMT of core and hip strength in order to improve standing and walking tolerance ?Baseline: patient demonstrates gross limitations in core and hip stren

## 2021-07-22 NOTE — Telephone Encounter (Signed)
? ?  Name: Victor [name]  ?DOB: 05-02-1970  ?MRN: [id number] ? ?Primary Cardiologist: Thomasene Ripple, DO ? ?Chart reviewed as part of pre-operative protocol coverage. Because of [name]'s past medical history and time since last visit, he will require a follow-up visit in order to better assess preoperative cardiovascular risk. ? ?Pre-op covering staff: ?- Please schedule appointment and call patient to inform them. If patient already had an upcoming appointment within acceptable timeframe, please add "pre-op clearance" to the appointment notes so provider is aware. ?- Please contact requesting surgeon's office via preferred method (i.e, phone, fax) to inform them of need for appointment prior to surgery. ? ?If applicable, this message will also be routed to pharmacy pool and/or primary cardiologist for input on holding anticoagulant/antiplatelet agent as requested below so that this information is available to the clearing provider at time of patient's appointment.  ? ?Azalee Course, [place]  ?07/22/2021, 12:26 PM  ? ?

## 2021-07-22 NOTE — Telephone Encounter (Signed)
Pt has been scheduled to see Hubbard Hartshorn, NP 07/29/21, clearance will be addressed at that time. ? ?Will route back to the requesting surgeon's office to make them aware. ?

## 2021-07-23 ENCOUNTER — Other Ambulatory Visit: Payer: Self-pay | Admitting: Endocrinology

## 2021-07-23 ENCOUNTER — Encounter: Payer: Self-pay | Admitting: Endocrinology

## 2021-07-23 ENCOUNTER — Telehealth: Payer: Self-pay

## 2021-07-23 ENCOUNTER — Other Ambulatory Visit (HOSPITAL_COMMUNITY): Payer: Self-pay

## 2021-07-23 ENCOUNTER — Ambulatory Visit (INDEPENDENT_AMBULATORY_CARE_PROVIDER_SITE_OTHER): Payer: Medicaid Other | Admitting: Endocrinology

## 2021-07-23 VITALS — BP 126/90 | HR 95 | Ht 71.0 in | Wt 251.6 lb

## 2021-07-23 DIAGNOSIS — E119 Type 2 diabetes mellitus without complications: Secondary | ICD-10-CM

## 2021-07-23 LAB — POCT GLYCOSYLATED HEMOGLOBIN (HGB A1C): Hemoglobin A1C: 8.9 % — AB (ref 4.0–5.6)

## 2021-07-23 MED ORDER — LANTUS SOLOSTAR 100 UNIT/ML ~~LOC~~ SOPN: 15.0000 [IU] | PEN_INJECTOR | SUBCUTANEOUS | 99 refills | Status: DC

## 2021-07-23 NOTE — Telephone Encounter (Signed)
Called pharmacy. Pt plan covers brand Lantus Solostar. They are filling it now

## 2021-07-23 NOTE — Patient Instructions (Addendum)
I have sent a prescription to your pharmacy, to add Lantus (insulin) 15 units each morning.   ?Please see a diabetes educator, to learn about taking the insulin.   ?Please continue the same other diabetes medications ?check your blood sugar once a day.  vary the time of day when you check, between before the 3 meals, and at bedtime.  also check if you have symptoms of your blood sugar being too high or too low.  please keep a record of the readings and bring it to your next appointment here (or you can bring the meter itself).  You can write it on any piece of paper.  please call us sooner if your blood sugar goes below 70, or if most of your readings are over 200.   ?Please come back for a follow-up appointment in 3 months.    ?

## 2021-07-23 NOTE — Telephone Encounter (Signed)
Patient called in today after his visit. Patient is asking for Rx to be sent to the correct pharmacy as well as he is needing a PA for two other medications.  ? ? ?Please call and advise  ? ?Patient wasn't able to tell me the correct medication starting with the letter "G" I asked about what was on his list and said either medication was correct for the PA..  ? ?What is needing a PA is  ? ?TRULICITY 4.5 MG/0.5ML SOPN ? ?insulin glargine (LANTUS SOLOSTAR) 100 UNIT/ML Solostar Pen ? ? ?[facility] - [place] ([place]), [place] - [address] ?

## 2021-07-23 NOTE — Progress Notes (Signed)
? ?Subjective:  ? ? Patient ID: Victor [name], male    DOB: [birth year], 51 y.o.   MRN: [id number] ? ?HPI ?Pt returns for f/u of diabetes mellitus: ?DM type: 2 ?Dx'ed: 2012 ?Complications: none ?Therapy: Trulicity and 4 oral meds.   ?DKA: never ?Severe hypoglycemia: never.   ?Pancreatitis: never ?Pancreatic imaging: normal on 2021 CT.   ?SDOH: he is disabled; he is on parole.  ?Other: he never been on insulin; metformin dosage is limited by nausea.   ?Interval history: Pt says cbg's are in the high-100's.  He takes meds meds as rx'ed.  No recent steroids.   ?Past Medical History:  ?Diagnosis Date  ? Acute on chronic congestive heart failure ([facility])   ? Arthritis   ? Gout   ? Hay fever   ? High cholesterol   ? Hypertension   ? Mental health problem   ? Near syncope 12/27/2019  ? Pre-syncope 12/27/2019  ? Shortness of breath 02/06/2020  ? Syncope 12/26/2019  ? Type 2 diabetes mellitus without complication, without long-term current use of insulin ([facility])   ? Unstable angina ([facility]) 02/06/2020  ? ? ?Past Surgical History:  ?Procedure Laterality Date  ? EYE SURGERY    ? HEMORROIDECTOMY    ? RIGHT/LEFT HEART CATH AND CORONARY ANGIOGRAPHY N/A 02/06/2020  ? Procedure: RIGHT/LEFT HEART CATH AND CORONARY ANGIOGRAPHY;  Surgeon: Marykay Lex, MD;  Location: [facility] INV[facility];  Service: Cardiovascular;  Laterality: N/A;  ? WISDOM TOOTH EXTRACTION    ? ? ?Social History  ? ?Socioeconomic History  ? Marital status: Divorced  ?  Spouse name: Not on file  ? Number of children: Not on file  ? Years of education: Not on file  ? Highest education level: Not on file  ?Occupational History  ? Not on file  ?Tobacco Use  ? Smoking status: Former  ?  Types: Cigarettes  ?  Quit date: 04/20/2000  ?  Years since quitting: 21.2  ? Smokeless tobacco: Never  ?Vaping Use  ? Vaping Use: Never used  ?Substance and Sexual Activity  ? Alcohol use: Never  ? Drug use: Never  ? Sexual activity: Not Currently  ?Other Topics Concern  ? Not on file  ?Social History  Narrative  ? Works 3rd shift   ? ?Social Determinants of Health  ? ?Financial Resource Strain: Not on file  ?Food Insecurity: Not on file  ?Transportation Needs: Not on file  ?Physical Activity: Not on file  ?Stress: Not on file  ?Social Connections: Not on file  ?Intimate Partner Violence: Not on file  ? ? ?Current Outpatient Medications on File Prior to Visit  ?Medication Sig Dispense Refill  ? ACCU-CHEK GUIDE test strip 1 each by Other route 2 (two) times daily. as directed    ? Accu-Chek Softclix Lancets lancets 1 each by Other route in the morning and at bedtime.    ? acetaminophen (TYLENOL) 325 MG tablet Take 2 tablets (650 mg total) by mouth every 6 (six) hours as needed for mild pain (or Fever >/= 101).    ? bromocriptine (PARLODEL) 2.5 MG tablet Take 1 tablet (2.5 mg total) by mouth at bedtime. 90 tablet 3  ? empagliflozin (JARDIANCE) 25 MG TABS tablet Take 1 tablet (25 mg total) by mouth daily before breakfast. 90 tablet 3  ? furosemide (LASIX) 20 MG tablet Take 1 tablet (20 mg total) by mouth daily. Take with 40 mg for a total of 60 mg 30 tablet 1  ? furosemide (  LASIX) 40 MG tablet Take 40 mg by mouth daily. Take with 20 mg for a total of 60 mg    ? gabapentin (NEURONTIN) 300 MG capsule Take 300 mg by mouth every evening.    ? glipiZIDE (GLUCOTROL) 5 MG tablet Take 1 tablet (5 mg total) by mouth daily before breakfast. 90 tablet 3  ? hydrOXYzine (ATARAX) 10 MG tablet Take 1 tablet (10 mg total) by mouth 3 (three) times daily as needed. 30 tablet 0  ? losartan (COZAAR) 25 MG tablet Take 25 mg by mouth daily.    ? meloxicam (MOBIC) 7.5 MG tablet Take 1 tablet (7.5 mg total) by mouth daily. 30 tablet 1  ? metFORMIN (GLUCOPHAGE-XR) 500 MG 24 hr tablet Take 2 tablets (1,000 mg total) by mouth daily. 180 tablet 3  ? methocarbamol (ROBAXIN) 500 MG tablet Take 1 tablet (500 mg total) by mouth 4 (four) times daily. 60 tablet 3  ? metoprolol succinate (TOPROL-XL) 25 MG 24 hr tablet Take 0.5 tablets (12.5 mg total)  by mouth daily. 45 tablet 1  ? potassium chloride SA (KLOR-CON) 20 MEQ tablet Take 20 mEq by mouth daily.    ? rosuvastatin (CRESTOR) 10 MG tablet Take 10 mg by mouth daily.    ? sertraline (ZOLOFT) 50 MG tablet Take 50 mg by mouth daily.    ? triamcinolone cream (KENALOG) 0.1 % Apply 1 application topically 2 (two) times daily. 80 g 0  ? TRULICITY 4.5 MG/0.5ML SOPN Inject 4.5 mg into the skin once a week. 6 mL 3  ? witch hazel-glycerin (TUCKS) pad Place rectally.    ? HYDROcodone-acetaminophen (NORCO/VICODIN) 5-325 MG tablet Take 0.5-1 tablets by mouth 2 (two) times daily.    ? ?No current facility-administered medications on file prior to visit.  ? ? ?Allergies  ?Allergen Reactions  ? Shellfish-Derived Products Hives  ? Coconut Oil Hives and Rash  ? ? ?Family History  ?Problem Relation Age of Onset  ? Diabetes Mother   ? Hypertension Father   ? Hypertension Brother   ? Hypertension Maternal Grandfather   ? Hypertension Paternal Grandmother   ? ? ?BP 126/90 (BP Location: Left Arm, Patient Position: Sitting, Cuff Size: Normal)   Pulse 95   Ht 5\' 11"  (1.803 m)   Wt 251 lb 9.6 oz (114.1 kg)   SpO2 95%   BMI 35.09 kg/m?  ? ? ?Review of Systems ? ?   ?Objective:  ? Physical Exam ?VITAL SIGNS:  See vs page. ?GENERAL: no distress.   ? ? ?Lab Results  ?Component Value Date  ? CREATININE 0.96 07/01/2021  ? BUN 14 07/01/2021  ? NA 138 07/01/2021  ? K 4.0 07/01/2021  ? CL 100 07/01/2021  ? CO2 26 07/01/2021  ? ?A1c=8.9% ?   ?Assessment & Plan:  ?Type 2 DM: uncontrolled.  I advised insulin.   ? ?Patient Instructions  ?I have sent a prescription to your pharmacy, to add Lantus (insulin) 15 units each morning.   ?Please see a diabetes educator, to learn about taking the insulin.   ?Please continue the same other diabetes medications ?check your blood sugar once a day.  vary the time of day when you check, between before the 3 meals, and at bedtime.  also check if you have symptoms of your blood sugar being too high or too  low.  please keep a record of the readings and bring it to your next appointment here (or you can bring the meter itself).  You can  write it on any piece of paper.  please call us sooner if your blood sugar goes below 70, or if most of your readings are over 200.   ?Please come back for a follow-up appointment in 3 months.    ? ? ?

## 2021-07-24 ENCOUNTER — Encounter: Payer: Self-pay | Admitting: Physical Therapy

## 2021-07-24 ENCOUNTER — Other Ambulatory Visit: Payer: Self-pay

## 2021-07-24 ENCOUNTER — Encounter: Payer: Medicaid Other | Attending: Endocrinology | Admitting: Dietician

## 2021-07-24 ENCOUNTER — Ambulatory Visit: Payer: Medicaid Other | Attending: Family Medicine | Admitting: Physical Therapy

## 2021-07-24 ENCOUNTER — Other Ambulatory Visit (HOSPITAL_COMMUNITY): Payer: Self-pay

## 2021-07-24 DIAGNOSIS — E119 Type 2 diabetes mellitus without complications: Secondary | ICD-10-CM | POA: Diagnosis present

## 2021-07-24 DIAGNOSIS — M5442 Lumbago with sciatica, left side: Secondary | ICD-10-CM | POA: Insufficient documentation

## 2021-07-24 DIAGNOSIS — Z713 Dietary counseling and surveillance: Secondary | ICD-10-CM | POA: Insufficient documentation

## 2021-07-24 DIAGNOSIS — M6281 Muscle weakness (generalized): Secondary | ICD-10-CM | POA: Insufficient documentation

## 2021-07-24 DIAGNOSIS — Z6834 Body mass index (BMI) 34.0-34.9, adult: Secondary | ICD-10-CM | POA: Diagnosis not present

## 2021-07-24 DIAGNOSIS — G8929 Other chronic pain: Secondary | ICD-10-CM | POA: Insufficient documentation

## 2021-07-24 NOTE — Patient Instructions (Addendum)
Take the Lantus (insulin) every morning  as prescribed by your doctor.  Please call me if you have any questions. ? [facility] ph # [phone] ? ?Eat more Non-Starchy Vegetables ?These include greens, broccoli, cauliflower, cabbage, carrots, beets, eggplant, peppers, squash and others. ?Minimize added sugars and refined grains ?Rethink what you drink.  Choose beverages without added sugar.  Look for 0 carbs on the label. ?See the list of whole grains below.  Find alternatives to usual sweet treats. ?Choose whole foods over processed. ?Make simple meals at home more often than eating out. ? ?Tips to increase fiber in your diet: ?(All plants have fiber.  Eat a variety. There are more than are on this list.) ?Slowly increase the amount of fiber you eat to 25-35 grams per day.  (More is fine if you tolerate it.) ?Fiber from whole grains, nuts and seeds ?Quinoa, 1/2 cup = 5 grams ?Bulgur, 1/2 cup = 4.1 grams ?Popcorn, 3 cups = 3.6 grams ?Whole Wheat Spaghetti, 1/2 cup = 3.2 grams ?Barley, 1/2 cup = 3 grams ?Oatmeal, 1/2 cup = 2 grams ?Whole Wheat English Muffin = 3 grams ?Corn, 1/2 cup = 2.1 grams ?Brown Rice, 1/2 cup = 1.8 grams ?Flax seeds, 1 Tablespoon = 2.8 grams ?Chia seeds, 1 Tablespoon = 11 grams ?Almonds, 1 ounce = 3.5 grams fiber ?Fiber from legumes ?Kidney beans, 1/2 cup 7.9 grams ?Lentils, 1/2 cup = 7.8 grams ?Pinto beans, 1/2 cup = 7.7 grams ?Black beans, 1/2 cup = 7.6 grams ?Lima beans, 1/2 cup 6.4 grams ?Chick peas, 1/2 cup = 5.3 grams ?Black eyed peas, 1/2 cup = 4 grams ?Fiber from fruits and vegetables ?Pear, 6 grams ?Apple. 3.3 grams ?Raspberries or Blackberries, 3/4 cup = 6 grams ?Strawberries or Blueberries, 1 cup = 3.4 grams ?Baked sweet potato 3.8 grams fiber ?Baked potato with skin 4.4 grams  ?Peas, 1/2 cup = 4.4 grams  ?Spinach, 1/2 cup cooked = 3.5 grams  ?Avocado, 1/2 = 5 grams ? ? ? ? ? ? ? ? ?

## 2021-07-24 NOTE — Telephone Encounter (Signed)
Error

## 2021-07-24 NOTE — Progress Notes (Signed)
?Diabetes Self-Management Education ? ?Visit Type: First/Initial ? ?Appt. Start Time: 1445 Appt. End Time: 1224 ? ?07/25/2021 ? ?Victor [name], identified by name and date of birth, is a 51 y.o. male with a diagnosis of Diabetes: Type 2.  ? ?ASSESSMENT ?Patient is here today alone. ?He is to start taking insulin once a day.  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 ?He needs instruction on this as well as controlling his blood glucose. ?He has been fasting 5:30 am-sundown at night for religious reasons but he states that he does not think that he can continue this.   ?Blood glucose in the office 150 today (fasting) ?Needs full diabetes education at follow up visit. ?Follow up call to patient 07/25/2021.  Patient is going to pick up the insulin this am.  He is more confident that he can give the insulin injections.  Instructed him to call for questions. ? ?History includes Type 2 Diabetes (01/2020), neuropathy, glaucoma, HTN, HLD, CHF ?Medications include:  glipizide, Jardiance, Metformin, Trulicity, and is to start Lantus 15 units q am, lasix, potassium ? ?Patient lives alone.  He does his own shopping and cooking. ?He is going to physical therapy due to his back (sciatica) and states that he will need surgery soon. ?He is on disability.  He is concerned about his health and has little support.  We spoke about him texting his sister daily and developing a safety plan. 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 ? ?Height _0  (1.803 m), weight 248 lb (112.5 kg). ?Body mass index is 34.59 kg/m?. ? ? Diabetes Self-Management Education - 07/25/21 0919   ? ?  ? Visit Information  ? Visit Type First/Initial   ?  ? Initial Visit  ? Diabetes Type Type 2   ? Are you currently following a meal plan? No   ? Are you taking your medications as prescribed? Yes   ? Date Diagnosed 01/2020   ?  ? Health Coping  ? How would you rate your overall health? Fair   ?  ? Psychosocial Assessment  ? Patient Belief/Attitude about Diabetes Afraid    ? Self-care barriers Impaired vision   fear of needles  ? Self-management support Doctor's office   ? Other persons present Patient   ? Patient Concerns Medication;Glycemic Control   ? Special Needs None;Large print   ? Preferred Learning Style No preference indicated   ? Learning Readiness Ready   ? How often do you need to have someone help you when you read instructions, pamphlets, or other written materials from your doctor or pharmacy? 1 - Never   ? What is the last grade level you completed in school? 12   ?  ? Pre-Education Assessment  ? Patient understands the diabetes disease and treatment process. Needs Instruction   ? Patient understands incorporating nutritional management into lifestyle. Needs Instruction   ? Patient undertands incorporating physical activity into lifestyle. Needs Instruction   ? Patient understands using medications safely. Needs Instruction   ? Patient understands monitoring blood glucose, interpreting and using results Needs Instruction   ? Patient understands prevention, detection, and treatment of acute complications. Needs Instruction   ? Patient understands prevention, detection, and treatment of chronic complications. Needs Instruction   ? Patient understands how to develop strategies to address psychosocial issues. Needs Instruction   ? Patient understands how to develop strategies to promote health/change behavior. Needs Instruction   ?  ? Complications  ? Last HgB A1C per patient/outside source  8.9 %   07/23/2021  ? How often do you check your blood sugar? 0 times/day (not testing)   ? Fasting Blood glucose range (mg/dL) 130-179   ? Number of hypoglycemic episodes per month 0   ?  ? Dietary Intake  ? Breakfast 2 packs oatmeal, banana usually but currently fasting   ? Dinner smoked [place] neck, mac & cheese, peas, cabbage   ? Beverage(s) water, juice, occasional regular soda, flavored water, coffee with sweet and low and powdered creamer   ?  ? Exercise  ? Exercise Type Light  (walking / raking leaves)   ?  ? Patient Education  ? Previous Diabetes Education No   ? Nutrition management  Role of diet in the treatment of diabetes and the relationship between the three main macronutrients and blood glucose level;Meal options for control of blood glucose level and chronic complications.   ? Medications Taught/reviewed insulin injection, site rotation, insulin storage and needle disposal.   extreme fear of needles  ? Acute complications Taught treatment of hypoglycemia - the 15 rule.   ? Psychosocial adjustment Worked with patient to identify barriers to care and solutions;Identified and addressed patients feelings and concerns about diabetes   ?  ? Individualized Goals (developed by patient)  ? Nutrition General guidelines for healthy choices and portions discussed   ? Medications take my medication as prescribed   ? Monitoring  test my blood glucose as discussed   ?  ? Post-Education Assessment  ? Patient understands the diabetes disease and treatment process. Needs Review   ? Patient understands incorporating nutritional management into lifestyle. Needs Review   ? Patient undertands incorporating physical activity into lifestyle. Needs Review   ? Patient understands using medications safely. Needs Review   ? Patient understands monitoring blood glucose, interpreting and using results Needs Review   ? Patient understands prevention, detection, and treatment of acute complications. Needs Review   ? Patient understands prevention, detection, and treatment of chronic complications. Needs Review   ? Patient understands how to develop strategies to address psychosocial issues. Needs Review   ? Patient understands how to develop strategies to promote health/change behavior. Needs Review   ?  ? Outcomes  ? Expected Outcomes Demonstrated interest in learning. Expect positive outcomes   ? Future DMSE 2 months   ? Program Status Not Completed   ? ?  ?  ? ?  ? ? ?Individualized Plan for Diabetes  Self-Management Training:  ? ?Learning Objective:  Patient will have a greater understanding of diabetes self-management. ?Patient education plan is to attend individual and/or group sessions per assessed needs and concerns. ?  ?Plan:  ? ?Patient Instructions  ?Take the Lantus (insulin) every morning  as prescribed by your doctor.  Please call me if you have any questions. ? ?Theodosia ph # [phone] ? ?Eat more Non-Starchy Vegetables ?These include greens, broccoli, cauliflower, cabbage, carrots, beets, eggplant, peppers, squash and others. ?Minimize added sugars and refined grains ?Rethink what you drink.  Choose beverages without added sugar.  Look for 0 carbs on the label. ?See the list of whole grains below.  Find alternatives to usual sweet treats. ?Choose whole foods over processed. ?Make simple meals at home more often than eating out. ? ?Tips to increase fiber in your diet: ?(All plants have fiber.  Eat a variety. There are more than are on this list.) ?Slowly increase the amount of fiber you eat to 25-35 grams per day.  (More is  fine if you tolerate it.) ?Fiber from whole grains, nuts and seeds ?Quinoa, 1/2 cup = 5 grams ?Bulgur, 1/2 cup = 4.1 grams ?Popcorn, 3 cups = 3.6 grams ?Whole Wheat Spaghetti, 1/2 cup = 3.2 grams ?Barley, 1/2 cup = 3 grams ?Oatmeal, 1/2 cup = 2 grams ?Whole Wheat English Muffin = 3 grams ?Corn, 1/2 cup = 2.1 grams ?Brown Rice, 1/2 cup = 1.8 grams ?Flax seeds, 1 Tablespoon = 2.8 grams ?Chia seeds, 1 Tablespoon = 11 grams ?Almonds, 1 ounce = 3.5 grams fiber ?Fiber from legumes ?Kidney beans, 1/2 cup 7.9 grams ?Lentils, 1/2 cup = 7.8 grams ?Pinto beans, 1/2 cup = 7.7 grams ?Black beans, 1/2 cup = 7.6 grams ?Lima beans, 1/2 cup 6.4 grams ?Chick peas, 1/2 cup = 5.3 grams ?Black eyed peas, 1/2 cup = 4 grams ?Fiber from fruits and vegetables ?Pear, 6 grams ?Apple. 3.3 grams ?Raspberries or Blackberries, 3/4 cup = 6 grams ?Strawberries or Blueberries, 1 cup = 3.4 grams ?Baked sweet  potato 3.8 grams fiber ?Baked potato with skin 4.4 grams  ?Peas, 1/2 cup = 4.4 grams  ?Spinach, 1/2 cup cooked = 3.5 grams  ?Avocado, 1/2 = 5 grams ? ? ? ? ? ? ? ?Expected Outcomes:  Demonstrated interest in

## 2021-07-24 NOTE — Telephone Encounter (Signed)
I'm calling the pharmacy when they reopen at 2pm. Trulicity is showing last filled on 05/19/21 for a 3 month supply, and Lantus is showing it was filled on yesterday but I'm unable to see what the co-pay is. Will follow up.

## 2021-07-24 NOTE — Telephone Encounter (Signed)
I called the [facility] and they have not filled either medication for the patient. Do you know where the pt last filled these medications? From what I see when I test bill, is PA no required.  ?

## 2021-07-25 ENCOUNTER — Encounter: Payer: Self-pay | Admitting: Dietician

## 2021-07-28 NOTE — Progress Notes (Signed)
? ?Office Visit  ?  ?Patient Name: Victor [name] ?Date of Encounter: 07/29/2021 ? ?PCP:  Hoy RegisterNewlin, Enobong, MD ?  ?[facility]  ?Cardiologist:  Thomasene RippleKardie Tobb, DO  ?Advanced Practice Provider:  No care team member to display ?Electrophysiologist:  None  ?   ? ?Chief Complaint  ?  ?Victor [name] is a 51 y.o. male with a hx of diastolic heart failure,LE edema, HTN, HLD, DM2, syncope, obesity, NSVT presents today for preoperative clearance  ? ?Past Medical History  ?  ?Past Medical History:  ?Diagnosis Date  ? Acute on chronic congestive heart failure (HCC)   ? Arthritis   ? Gout   ? Hay fever   ? High cholesterol   ? Hypertension   ? Mental health problem   ? Near syncope 12/27/2019  ? Pre-syncope 12/27/2019  ? Shortness of breath 02/06/2020  ? Syncope 12/26/2019  ? Type 2 diabetes mellitus without complication, without long-term current use of insulin (HCC)   ? Unstable angina (HCC) 02/06/2020  ? ?Past Surgical History:  ?Procedure Laterality Date  ? EYE SURGERY    ? HEMORROIDECTOMY    ? RIGHT/LEFT HEART CATH AND CORONARY ANGIOGRAPHY N/A 02/06/2020  ? Procedure: RIGHT/LEFT HEART CATH AND CORONARY ANGIOGRAPHY;  Surgeon: Marykay LexHarding, David W, MD;  [facility];  Service: Cardiovascular;  Laterality: N/A;  ? WISDOM TOOTH EXTRACTION    ? ? ?Allergies ? ?Allergies  ?Allergen Reactions  ? Shellfish-Derived Products Hives  ? Coconut Oil Hives and Rash  ? ? ?History of Present Illness  ?  ?Victor [name] is a 51 y.o. male with a hx of diastolic heart failure, LE edema, HTN, HLD, DM2, syncope, obesity, NSVT last seen 02/2020 by Dr. Servando Salinaobb. ? ?Echo 12/2019 with LVEF 60-65%, no RWMA, RV normal size/function, trivial MR, mild to moderate aortic valve sclerosis without stenosis, small PFO unable to be excluded. Monitor 01/2020 with 2 rune VT and 1 run NSVT. Toprol 12.5mg  QD [facility] with angiographic normal coronary arteries, normal right and left heart pressures, LVEF 55-65%. He was seen by  vascular and recommended for lymphedema pump. ? ?Presents today for preop clearance for lumbar microdisectomy. He is hopeful surgery will increase his mobility. Reports no shortness of breath nor dyspnea on exertion. Reports no chest pain, pressure, or tightness. No edema, orthopnea, PND. Reports no palpitations.  He des walk around his home for exercise. He is able to complete 4 METS of exercise.  ? ?EKGs/Labs/Other Studies Reviewed:  ? ?The following studies were reviewed today: ? ?Zio monitor 01/2020 ?The patient wore the monitor for 6 days 10 hours starting January 31, 2020   . ?Indication: Syncope ?The minimum heart rate was 54 bpm, maximum heart rate was 231 bpm, and average heart rate was 83 bpm. ?Predominant underlying rhythm was Sinus Rhythm. ?  ?2 Ventricular Tachycardia runs occurred, the run with the fastest interval lasting 5 beats with a maximum rate of 231 bpm (average 146 bpm); the run with the fastest ?interval was also the longest.  ?  ?1 run of Supraventricular Tachycardia occurred lasting 4 beats with a maximal rate of 130 bpm (average 122 bpm). ?  ?Premature atrial complexes were rare less than 1%. ?Premature Ventricular complexes were rare less than 1%. ?  ?No pauses, No AV block and no atrial fibrillation present. ?No patient triggered events were noted.  ?  ?Conclusion: This study is remarkable for the following: ?  1.  2 episodes of nonsustained ventricular tachycardia. ?                            2.  One episode paroxysmal supraventricular tachycardia.   ?  ?Right and left heart catheterization 01/2020 ?The left ventricular systolic function is normal. The left ventricular ejection fraction is 55-65% by visual estimate. ?LV end diastolic pressure is normal. ?Right Heart Cath pressures are normal. Low normal cardiac output-index. ?-------------------------- ?Angiographically normal coronary arteries with Left dominant system and cloacal Left Main ?   ?SUMMARY ?Angiographic normal coronary arteries with dominant Circumflex (LCx).   ?Cloacal Left Main. ?Normal Left and Right Heart Pressures-LVEDP 12 mmHg, PCWP 9 mmHg. ?Cardiac Output & Index: Fick 6.41, 2.79; thermal 5.08, 2.21 ?  ?TTE IMPRESSIONS 12/2019 ? 1. Left ventricular ejection fraction, by estimation, is 60 to 65%. The left ventricle has normal function. The left ventricle has no regional  ?  ricular diastolic parameters were  ?normal.  ? 2. Right ventricular systolic function is normal. The right ventricular  ?size is normal. There is normal pulmonary artery systolic pressure.  ? 3. Cannot r/o small PFO by color.  ? 4. The mitral valve is normal in structure. Trivial mitral valve  ?regurgitation. No evidence of mitral stenosis.  ? 5. The aortic valve is normal in structure. Aortic valve regurgitation is  ?not visualized. Mild to moderate aortic valve sclerosis/calcification is  ?present, without any evidence of aortic stenosis.  ? 6. The inferior vena cava is normal in size with greater than 50%  ?respiratory variability, suggesting right atrial pressure of 3 mmHg.  ?  ?EKG:  EKG is ordered today.  The ekg ordered today demonstrates NSR 83 bpm with no acute ST/T wave changes.  ? ?Recent Labs: ?05/16/2021: TSH 2.24 ?07/01/2021: ALT 35; BNP 4.6; BUN 14; Creatinine, Ser 0.96; Potassium 4.0; Sodium 138  ?Recent Lipid Panel ?   ?Component Value Date/Time  ? CHOL 168 12/27/2019 1228  ? TRIG 185 (H) 12/27/2019 1228  ? HDL 32 (L) 12/27/2019 1228  ? CHOLHDL 5.3 12/27/2019 1228  ? VLDL 37 12/27/2019 1228  ? LDLCALC 99 12/27/2019 1228  ? ? ?Home Medications  ? ?Current Meds  ?Medication Sig  ? ACCU-CHEK GUIDE test strip 1 each by Other route 2 (two) times daily. as directed  ? Accu-Chek Softclix Lancets lancets 1 each by Other route in the morning and at bedtime.  ? acetaminophen (TYLENOL) 325 MG tablet Take 2 tablets (650 mg total) by mouth every 6 (six) hours as needed for mild pain (or Fever >/= 101).  ?  bromocriptine (PARLODEL) 2.5 MG tablet Take 1 tablet (2.5 mg total) by mouth at bedtime.  ? empagliflozin (JARDIANCE) 25 MG TABS tablet Take 1 tablet (25 mg total) by mouth daily before breakfast.  ? furosemide (LASIX) 20 MG tablet Take 1 tablet (20 mg total) by mouth daily. Take with 40 mg for a total of 60 mg  ? furosemide (LASIX) 40 MG tablet Take 40 mg by mouth daily. Take with 20 mg for a total of 60 mg  ? gabapentin (NEURONTIN) 300 MG capsule Take 300 mg by mouth every evening.  ? glipiZIDE (GLUCOTROL) 5 MG tablet Take 1 tablet (5 mg total) by mouth daily before breakfast.  ? HYDROcodone-acetaminophen (NORCO/VICODIN) 5-325 MG tablet Take 0.5-1 tablets by mouth 2 (two) times daily.  ? hydrOXYzine (ATARAX) 10 MG tablet Take 1 tablet (10 mg total) by mouth  3 (three) times daily as needed.  ? insulin glargine (LANTUS SOLOSTAR) 100 UNIT/ML Solostar Pen Inject 15 Units into the skin every morning. And pen needles 1/day  ? losartan (COZAAR) 25 MG tablet Take 25 mg by mouth daily.  ? meloxicam (MOBIC) 7.5 MG tablet Take 1 tablet (7.5 mg total) by mouth daily.  ? metFORMIN (GLUCOPHAGE-XR) 500 MG 24 hr tablet Take 2 tablets (1,000 mg total) by mouth daily.  ? methocarbamol (ROBAXIN) 500 MG tablet Take 1 tablet (500 mg total) by mouth 4 (four) times daily.  ? metoprolol succinate (TOPROL-XL) 25 MG 24 hr tablet Take 0.5 tablets (12.5 mg total) by mouth daily.  ? potassium chloride SA (KLOR-CON) 20 MEQ tablet Take 20 mEq by mouth daily.  ? rosuvastatin (CRESTOR) 10 MG tablet Take 10 mg by mouth daily.  ? sertraline (ZOLOFT) 50 MG tablet Take 50 mg by mouth daily.  ? triamcinolone cream (KENALOG) 0.1 % Apply 1 application topically 2 (two) times daily.  ? TRULICITY 4.5 MG/0.5ML SOPN Inject 4.5 mg into the skin once a week.  ? witch hazel-glycerin (TUCKS) pad Place rectally.  ?  ? ?Review of Systems  ?    ?All other systems reviewed and are otherwise negative except as noted above. ? ?Physical Exam  ?  ?VS:  BP 110/80 (BP  Location: Left Arm, Patient Position: Sitting, Cuff Size: Large)   Pulse 83   Ht 5\' 11"  (1.803 m)   Wt 253 lb 4.8 oz (114.9 kg)   BMI 35.33 kg/m?  , BMI Body mass index is 35.33 kg/m?. ? ?Wt Readings from Last 3 Encounters:  ?04

## 2021-07-29 ENCOUNTER — Ambulatory Visit (INDEPENDENT_AMBULATORY_CARE_PROVIDER_SITE_OTHER): Payer: Medicaid Other | Admitting: Family

## 2021-07-29 ENCOUNTER — Encounter (HOSPITAL_BASED_OUTPATIENT_CLINIC_OR_DEPARTMENT_OTHER): Payer: Self-pay | Admitting: Family

## 2021-07-29 VITALS — BP 110/80 | HR 83 | Ht 71.0 in | Wt 253.3 lb

## 2021-07-29 DIAGNOSIS — E119 Type 2 diabetes mellitus without complications: Secondary | ICD-10-CM

## 2021-07-29 DIAGNOSIS — Z01818 Encounter for other preprocedural examination: Secondary | ICD-10-CM

## 2021-07-29 DIAGNOSIS — R600 Localized edema: Secondary | ICD-10-CM

## 2021-07-29 DIAGNOSIS — E669 Obesity, unspecified: Secondary | ICD-10-CM

## 2021-07-29 NOTE — Patient Instructions (Addendum)
Medication Instructions:  ?Continue your current medications.  ? ?*If you need a refill on your cardiac medications before your next appointment, please call your pharmacy* ? ? ?Lab Work: ?None ordered today.  ? ?Testing/Procedures: ?Your EKG today showed normal sinus rhythm which is a good result! ? ? ?Follow-Up: ?At [facility], you and your health needs are our priority.  As part of our continuing mission to provide you with exceptional heart care, we have created designated Provider Care Teams.  These Care Teams include your primary Cardiologist (physician) and Advanced Practice Providers (APPs -  Physician Assistants and Nurse Practitioners) who all work together to provide you with the care you need, when you need it. ? ?We recommend signing up for the patient portal called "MyChart".  Sign up information is provided on this After Visit Summary.  MyChart is used to connect with patients for Virtual Visits (Telemedicine).  Patients are able to view lab/test results, encounter notes, upcoming appointments, etc.  Non-urgent messages can be sent to your provider as well.   ?To learn more about what you can do with MyChart, go to [email or web address].   ? ?Your next appointment:   ?6 month(s) ? ?The format for your next appointment:   ?In Person ? ?Provider:   ?Berniece Salines, DO   ? ?Other Instructions ? ?Heart Healthy Diet Recommendations: ?A low-salt diet is recommended. Meats should be grilled, baked, or boiled. Avoid fried foods. Focus on lean protein sources like fish or chicken with vegetables and fruits. The American Heart Association is a Microbiologist!  American Heart Association Diet and Lifeystyle Recommendations   ? ?Exercise recommendations: ?The American Heart Association recommends 150 minutes of moderate intensity exercise weekly. ?Try 30 minutes of moderate intensity exercise 4-5 times per week. ?This could include walking, jogging, or swimming. ? ?Important Information About Sugar ? ? ? ? ?   ?

## 2021-07-30 ENCOUNTER — Other Ambulatory Visit: Payer: Self-pay

## 2021-07-30 MED ORDER — INSULIN PEN NEEDLE 30G X 8 MM MISC: 1.0000 | 6 refills | Status: DC | PRN

## 2021-07-31 ENCOUNTER — Other Ambulatory Visit: Payer: Self-pay

## 2021-07-31 DIAGNOSIS — E119 Type 2 diabetes mellitus without complications: Secondary | ICD-10-CM

## 2021-07-31 MED ORDER — PEN NEEDLES 32G X 4 MM MISC: 3 refills | Status: DC

## 2021-08-04 ENCOUNTER — Telehealth: Payer: Self-pay

## 2021-08-04 NOTE — Telephone Encounter (Signed)
Received cardiac clearance for surgery.  However, patient's A1c went up this month to 8.9.  Please advise. ?

## 2021-08-05 ENCOUNTER — Ambulatory Visit: Payer: Medicaid Other | Admitting: Critical Care Medicine

## 2021-08-07 ENCOUNTER — Encounter: Payer: Self-pay | Admitting: Specialist

## 2021-08-07 ENCOUNTER — Ambulatory Visit (INDEPENDENT_AMBULATORY_CARE_PROVIDER_SITE_OTHER): Payer: Medicaid Other | Admitting: Specialist

## 2021-08-07 VITALS — BP 116/77 | HR 76 | Ht 71.0 in | Wt 253.3 lb

## 2021-08-07 DIAGNOSIS — M5116 Intervertebral disc disorders with radiculopathy, lumbar region: Secondary | ICD-10-CM

## 2021-08-07 DIAGNOSIS — M5136 Other intervertebral disc degeneration, lumbar region: Secondary | ICD-10-CM | POA: Diagnosis not present

## 2021-08-07 NOTE — Progress Notes (Signed)
? ?Office Visit Note ?  ?Patient: Victor [name]           ?Date of Birth: 1970-06-05           ?MRN: [id number] ?Visit Date: 08/07/2021 ?             ?Requested by: Hoy RegisterNewlin, Enobong, MD ?[address] ?[address] ?[place],  [place] [id number] ?PCP: Hoy RegisterNewlin, Enobong, MD ? ? ?Assessment & Plan: ?Visit Diagnoses:  ?1. Degenerative disc disease, lumbar   ?2. Herniation of lumbar intervertebral disc with radiculopathy   ? ? ?Plan: Avoid frequent bending and stooping  ?No lifting greater than 10 lbs. ?May use ice or moist heat for pain. ?Weight loss is of benefit. ?Best medication for lumbar disc disease is arthritis medications like motrin, celebrex and naprosyn. ?Exercise is important to improve your indurance and does allow people to function better inspite of back pain. ?Continue with medications through pain management, after surgery we will provide meds for post surgical pain for 6 weeks.  ?  Avoid frequent bending and stooping  ?No lifting greater than 10 lbs. ?May use ice or moist heat for pain. ?Weight loss is of benefit. ?Best medication for lumbar disc disease is arthritis medications like motrin, celebrex and naprosyn. ?Exercise is important to improve your indurance and does allow people to function better inspite of back pain. ?You have a lumbar disc herniation with weakness on exam left side more than right. ?Avoid bending, stooping and avoid lifting weights greater than 10 lbs. ?Avoid prolong standing and walking. ?Order for a new walker with wheels. ?Surgery scheduling secretary Tivis RingerSherri Billings, will call you in the next week to schedule for surgery.  ?Surgery recommended is a one level lumbar Microdiscectomy left L3-4 and left L4-5 lateral recess decompression, this would be done with the OR microscope. ?Continue with pain management. ?Risk of surgery includes risk of infection 1 in 200 patients, bleeding 1/2% chance you would need a transfusion.   Risk to the nerves is one in 10,000. ?  ? ?Follow-Up  Instructions: Return in about 4 weeks (around 09/04/2021).  ? ?Orders:  ?No orders of the defined types were placed in this encounter. ? ?No orders of the defined types were placed in this encounter. ? ? ? ? Procedures: ?No procedures performed ? ? ?Clinical Data: ?No additional findings. ? ? ?Subjective: ?Chief Complaint  ?Patient presents with  ? Lower Back - Follow-up  ? ? ?51 year old male with history of low back and left leg pain, anterior right thigh and left anterior leg pain, pain into the left upper medial (PSIS) buttock. He is using a cane and feels weak in the left leg. Underwent HgbA1c about 2 weeks ago and it was 8.9. Surgery is to be rescheduled and the blood glucose brought under control before surgery can be done with less risk of infection. There is a 10 x greater rate of infection if HgbA1c is above 8.0. Bowel and bladder is normal. ?Pain with discomfort at night and it is constant and causes him to toss and twist. ? ?Review of Systems  ?Constitutional: Negative.   ?HENT: Negative.    ?Eyes: Negative.   ?Respiratory: Negative.    ?Cardiovascular: Negative.   ?Gastrointestinal: Negative.   ?Endocrine: Negative.   ?Genitourinary: Negative.   ?Musculoskeletal: Negative.   ?Skin: Negative.   ?Allergic/Immunologic: Negative.   ?Neurological: Negative.   ?Hematological: Negative.   ?Psychiatric/Behavioral: Negative.    ? ? ?Objective: ?Vital Signs: BP 116/77 (BP Location:  Left Arm, Patient Position: Sitting)   Pulse 76   Ht 5\' 11"  (1.803 m)   Wt 253 lb 4.8 oz (114.9 kg)   BMI 35.33 kg/m?  ? ?Physical Exam ?Constitutional:   ?   Appearance: He is well-developed.  ?HENT:  ?   Head: Normocephalic and atraumatic.  ?Eyes:  ?   Pupils: Pupils are equal, round, and reactive to light.  ?Pulmonary:  ?   Effort: Pulmonary effort is normal.  ?   Breath sounds: Normal breath sounds.  ?Abdominal:  ?   General: Bowel sounds are normal.  ?   Palpations: Abdomen is soft.  ?Musculoskeletal:  ?   Cervical back: Normal  range of motion and neck supple.  ?   Lumbar back: Positive right straight leg raise test. Negative left straight leg raise test.  ?Skin: ?   General: Skin is warm and dry.  ?Neurological:  ?   Mental Status: He is alert and oriented to person, place, and time.  ?Psychiatric:     ?   Behavior: Behavior normal.     ?   Thought Content: Thought content normal.     ?   Judgment: Judgment normal.  ? ?Back Exam  ? ?Tenderness  ?The patient is experiencing tenderness in the lumbar. ? ?Range of Motion  ?Extension:  abnormal  ?Flexion:  abnormal  ?Lateral bend right:  abnormal  ?Lateral bend left:  abnormal  ?Rotation right:  abnormal  ?Rotation left:  abnormal  ? ?Muscle Strength  ?Right Quadriceps:  5/5  ?Left Quadriceps:  4/5  ?Right Hamstrings:  5/5  ?Left Hamstrings:  5/5  ? ?Tests  ?Straight leg raise right: positive ?Straight leg raise left: negative ? ?Reflexes  ?Patellar:  0/4 ?Achilles:  2/4 ?Biceps:  2/4 ?Babinski's sign: normal  ? ?Other  ?Toe walk: abnormal ?Heel walk: abnormal ?Sensation: decreased ?Gait: antalgic  ?Erythema: no back redness ?Scars: absent ? ? ? ?Specialty Comments:  ?No specialty comments available. ? ?Imaging: ?No results found. ? ? ?PMFS History: ?Patient Active Problem List  ? Diagnosis Date Noted  ? Anxiety 06/05/2021  ? Bedbug bite 05/13/2021  ? Localized edema 05/13/2021  ? PSVT (paroxysmal supraventricular tachycardia) (HCC) 02/19/2020  ? NSVT (nonsustained ventricular tachycardia) (HCC) 02/19/2020  ? Obesity (BMI 30-39.9) 02/19/2020  ? Shortness of breath 02/06/2020  ? Unstable angina (HCC) 02/06/2020  ? Mental health problem   ? Hypertension   ? High cholesterol   ? Hay fever   ? Gout   ? Arthritis   ? Type 2 diabetes mellitus without complication, without long-term current use of insulin (HCC)   ? Near syncope 12/27/2019  ? Pre-syncope 12/27/2019  ? Acute on chronic congestive heart failure (HCC)   ? Syncope 12/26/2019  ? ?Past Medical History:  ?Diagnosis Date  ? Acute on chronic  congestive heart failure (HCC)   ? Arthritis   ? Gout   ? Hay fever   ? High cholesterol   ? Hypertension   ? Mental health problem   ? Near syncope 12/27/2019  ? Pre-syncope 12/27/2019  ? Shortness of breath 02/06/2020  ? Syncope 12/26/2019  ? Type 2 diabetes mellitus without complication, without long-term current use of insulin (HCC)   ? Unstable angina (HCC) 02/06/2020  ?  ?Family History  ?Problem Relation Age of Onset  ? Diabetes Mother   ? Hypertension Father   ? Hypertension Brother   ? Hypertension Maternal Grandfather   ? Hypertension Paternal Grandmother   ?  ?  Past Surgical History:  ?Procedure Laterality Date  ? EYE SURGERY    ? HEMORROIDECTOMY    ? RIGHT/LEFT HEART CATH AND CORONARY ANGIOGRAPHY N/A 02/06/2020  ? Procedure: RIGHT/LEFT HEART CATH AND CORONARY ANGIOGRAPHY;  Surgeon: Marykay Lex, MD;  Location: [facility] INVASIVE CV LAB;  Service: Cardiovascular;  Laterality: N/A;  ? WISDOM TOOTH EXTRACTION    ? ?Social History  ? ?Occupational History  ? Not on file  ?Tobacco Use  ? Smoking status: Former  ?  Types: Cigarettes  ?  Quit date: 04/20/2000  ?  Years since quitting: 21.3  ? Smokeless tobacco: Never  ?Vaping Use  ? Vaping Use: Never used  ?Substance and Sexual Activity  ? Alcohol use: Never  ? Drug use: Never  ? Sexual activity: Not Currently  ? ? ? ? ? ? ?

## 2021-08-07 NOTE — Patient Instructions (Addendum)
Plan: Avoid frequent bending and stooping  ?No lifting greater than 10 lbs. ?May use ice or moist heat for pain. ?Weight loss is of benefit. ?Best medication for lumbar disc disease is arthritis medications like motrin, celebrex and naprosyn. ?Exercise is important to improve your indurance and does allow people to function better inspite of back pain. ?Continue with medications through pain management, after surgery we will provide meds for post surgical pain for 6 weeks.  ? Avoid frequent bending and stooping  ?No lifting greater than 10 lbs. ?May use ice or moist heat for pain. ?Weight loss is of benefit. ?Best medication for lumbar disc disease is arthritis medications like motrin, celebrex and naprosyn. ?Exercise is important to improve your indurance and does allow people to function better inspite of back pain. ?You have a lumbar disc herniation with weakness on exam left side more than right. ?Avoid bending, stooping and avoid lifting weights greater than 10 lbs. ?Avoid prolong standing and walking. ?Order for a new walker with wheels. ?Surgery scheduling secretary Tivis Ringer, will call you in the next week to schedule for surgery.  ?Surgery recommended is a one level lumbar Microdiscectomy left L3-4 and left L4-5 lateral recess decompression this would be done with the OR microscope. ?Continue with pain management. ?Risk of surgery includes risk of infection 1 in 200 patients, bleeding 1/2% chance you would need a transfusion.   Risk to the nerves is one in 10,000. ?  ?

## 2021-08-28 LAB — HM DIABETES EYE EXAM

## 2021-09-06 LAB — EXTERNAL GENERIC LAB PROCEDURE: COLOGUARD: NEGATIVE

## 2021-09-06 LAB — COLOGUARD: COLOGUARD: NEGATIVE

## 2021-09-09 ENCOUNTER — Ambulatory Visit: Payer: Medicaid Other | Admitting: Family Medicine

## 2021-09-10 ENCOUNTER — Encounter: Payer: Self-pay | Admitting: Specialist

## 2021-09-10 ENCOUNTER — Ambulatory Visit (INDEPENDENT_AMBULATORY_CARE_PROVIDER_SITE_OTHER): Payer: Medicaid Other | Admitting: Specialist

## 2021-09-10 VITALS — BP 127/88 | HR 85 | Ht 71.0 in | Wt 252.4 lb

## 2021-09-10 DIAGNOSIS — M5136 Other intervertebral disc degeneration, lumbar region: Secondary | ICD-10-CM

## 2021-09-10 DIAGNOSIS — M48062 Spinal stenosis, lumbar region with neurogenic claudication: Secondary | ICD-10-CM | POA: Diagnosis not present

## 2021-09-10 DIAGNOSIS — M5116 Intervertebral disc disorders with radiculopathy, lumbar region: Secondary | ICD-10-CM

## 2021-09-10 NOTE — Progress Notes (Signed)
Office Visit Note   Patient: Victor [name]           Date of Birth: 12-26-1970           MRN: [id number] Visit Date: 09/10/2021              Requested by: Hoy Register, MD [address] [place],  [place] [address] PCP: Hoy Register, MD   Assessment & Plan: Visit Diagnoses:  1. Herniation of lumbar intervertebral disc with radiculopathy   2. Degenerative disc disease, lumbar   3. Spinal stenosis of lumbar region with neurogenic claudication     Plan: Avoid bending, stooping and avoid lifting weights greater than 10 lbs. Avoid prolong standing and walking. Order for a new walker with wheels. Surgery scheduling secretary Tivis Ringer, will call you in the next week to schedule for surgery.  Surgery recommended is a one level lumbar left L3-4 hemilaminectomy with probable discectomy this would be done with microscope. Take hydrocodone for for pain. Risk of surgery includes risk of infection 1 in 200 patients, bleeding 1/100% chance you would need a transfusion.   Risk to the nerves is one in 10,000.  Expect improved walking and standing tolerance. Expect relief of leg pain but numbness may persist depending on the length and degree of pressure that has been present.  Follow-Up Instructions: Return in about 4 weeks (around 10/08/2021).   Orders:  No orders of the defined types were placed in this encounter.  No orders of the defined types were placed in this encounter.     Procedures: No procedures performed   Clinical Data: No additional findings.   Subjective: Chief Complaint  Patient presents with   Lower Back - Follow-up    51 year old male with history of back pain and left leg pain and numbness. He reports the left leg is numb the whole leg anterior thigh and posterior thigh and leg. No bowel or bladder difficulties. He is on medications for diabetes and the control is improving. He has pain in the left leg and it feels like a rubber band  about the left leg and he feels like th blood circulation is cut off and he also is having trouble with tail bone pain and it feels like an ice cube. He is able to grocery shop and leans on the grocery cart. Wants to get a treadmill.    Review of Systems  Constitutional: Negative.   HENT:  Positive for sinus pressure, sinus pain and sore throat.   Eyes: Negative.   Respiratory: Negative.  Negative for wheezing.   Cardiovascular: Negative.   Gastrointestinal: Negative.   Endocrine: Negative.   Genitourinary: Negative.   Musculoskeletal: Negative.   Skin: Negative.   Allergic/Immunologic: Negative.   Neurological:  Positive for weakness and numbness.  Hematological: Negative.   Psychiatric/Behavioral: Negative.      Objective: Vital Signs: BP 127/88 (BP Location: Left Arm, Patient Position: Sitting)   Pulse 85   Ht 5\' 11"  (1.803 m)   Wt 252 lb 6.4 oz (114.5 kg)   BMI 35.20 kg/m   Physical Exam Constitutional:      Appearance: He is well-developed.  HENT:     Head: Normocephalic and atraumatic.  Eyes:     Pupils: Pupils are equal, round, and reactive to light.  Pulmonary:     Effort: Pulmonary effort is normal.     Breath sounds: Normal breath sounds.  Abdominal:     General: Bowel sounds are  normal.     Palpations: Abdomen is soft.  Musculoskeletal:        General: Normal range of motion.     Cervical back: Normal range of motion and neck supple.  Skin:    General: Skin is warm and dry.  Neurological:     Mental Status: He is alert and oriented to person, place, and time.  Psychiatric:        Behavior: Behavior normal.        Thought Content: Thought content normal.        Judgment: Judgment normal.    Ortho Exam  Specialty Comments:  No specialty comments available.  Imaging: No results found.   PMFS History: Patient Active Problem List   Diagnosis Date Noted   Anxiety 06/05/2021   Bedbug bite 05/13/2021   Localized edema 05/13/2021   PSVT  (paroxysmal supraventricular tachycardia) (HCC) 02/19/2020   NSVT (nonsustained ventricular tachycardia) (HCC) 02/19/2020   Obesity (BMI 30-39.9) 02/19/2020   Shortness of breath 02/06/2020   Unstable angina (HCC) 02/06/2020   Mental health problem    Hypertension    High cholesterol    Hay fever    Gout    Arthritis    Type 2 diabetes mellitus without complication, without long-term current use of insulin (HCC)    Near syncope 12/27/2019   Pre-syncope 12/27/2019   Acute on chronic congestive heart failure (HCC)    Syncope 12/26/2019   Past Medical History:  Diagnosis Date   Acute on chronic congestive heart failure (HCC)    Arthritis    Gout    Hay fever    High cholesterol    Hypertension    Mental health problem    Near syncope 12/27/2019   Pre-syncope 12/27/2019   Shortness of breath 02/06/2020   Syncope 12/26/2019   Type 2 diabetes mellitus without complication, without long-term current use of insulin (HCC)    Unstable angina (HCC) 02/06/2020    Family History  Problem Relation Age of Onset   Diabetes Mother    Hypertension Father    Hypertension Brother    Hypertension Maternal Grandfather    Hypertension Paternal Grandmother     Past Surgical History:  Procedure Laterality Date   EYE SURGERY     HEMORROIDECTOMY     RIGHT/LEFT HEART CATH AND CORONARY ANGIOGRAPHY N/A 02/06/2020   Procedure: RIGHT/LEFT HEART CATH AND CORONARY ANGIOGRAPHY;  Surgeon: Marykay Lex, MD;  Location: [facility] INVASIVE CV LAB;  Service: Cardiovascular;  Laterality: N/A;   WISDOM TOOTH EXTRACTION     Social History   Occupational History   Not on file  Tobacco Use   Smoking status: Former    Types: Cigarettes    Quit date: 04/20/2000    Years since quitting: 21.4   Smokeless tobacco: Never  Vaping Use   Vaping Use: Never used  Substance and Sexual Activity   Alcohol use: Never   Drug use: Never   Sexual activity: Not Currently

## 2021-09-10 NOTE — Patient Instructions (Signed)
Avoid bending, stooping and avoid lifting weights greater than 10 lbs. Avoid prolong standing and walking. Order for a new walker with wheels. Surgery scheduling secretary Kandice Hams, will call you in the next week to schedule for surgery.  Surgery recommended is a one level lumbar left L3-4 hemilaminectomy with probable discectomy this would be done with microscope. Take hydrocodone for for pain. Risk of surgery includes risk of infection 1 in 200 patients, bleeding 1/100% chance you would need a transfusion.   Risk to the nerves is one in 10,000.  Expect improved walking and standing tolerance. Expect relief of leg pain but numbness may persist depending on the length and degree of pressure that has been present.

## 2021-09-11 ENCOUNTER — Telehealth: Payer: Self-pay | Admitting: Family Medicine

## 2021-09-11 NOTE — Telephone Encounter (Signed)
Copied from CRM [id number]. Topic: Referral - Request for Referral >> Sep 11, 2021  2:07 PM Wyonia Hough E wrote: Has patient seen PCP for this complaint? Yes  *If NO, is insurance requiring patient see PCP for this issue before PCP can refer them? Referral for which specialty: Eye care  Preferred provider/office:  Reason for referral: Diabetic / needs eye exam asap / pt went to [facility] eye care over a year ago but vision is getting worse / please advise when sent

## 2021-09-12 ENCOUNTER — Encounter: Payer: Self-pay | Admitting: Family Medicine

## 2021-09-12 LAB — HM DIABETES EYE EXAM

## 2021-09-12 NOTE — Telephone Encounter (Signed)
Patient was called and informed that referral was placed in feb of this year and he should reach out to [facility] eye care to schedule an appointment.

## 2021-09-15 ENCOUNTER — Emergency Department (HOSPITAL_COMMUNITY): Payer: Medicaid Other

## 2021-09-15 ENCOUNTER — Other Ambulatory Visit: Payer: Self-pay

## 2021-09-15 ENCOUNTER — Emergency Department (HOSPITAL_COMMUNITY)
Admission: EM | Admit: 2021-09-15 | Discharge: 2021-09-15 | Disposition: A | Discharge status: Home / Self Care | Payer: Medicaid Other | Attending: Emergency Medicine | Admitting: Emergency Medicine

## 2021-09-15 DIAGNOSIS — W050XXA Fall from non-moving wheelchair, initial encounter: Secondary | ICD-10-CM | POA: Insufficient documentation

## 2021-09-15 DIAGNOSIS — W19XXXA Unspecified fall, initial encounter: Secondary | ICD-10-CM | POA: Insufficient documentation

## 2021-09-15 DIAGNOSIS — M545 Low back pain, unspecified: Secondary | ICD-10-CM | POA: Insufficient documentation

## 2021-09-15 DIAGNOSIS — M549 Dorsalgia, unspecified: Secondary | ICD-10-CM | POA: Diagnosis present

## 2021-09-15 MED ORDER — CYCLOBENZAPRINE HCL 10 MG PO TABS: 10.0000 mg | ORAL_TABLET | Freq: Two times a day (BID) | ORAL | 0 refills | Status: DC | PRN

## 2021-09-15 MED ORDER — HYDROCODONE-ACETAMINOPHEN 5-325 MG PO TABS
1.0000 | ORAL_TABLET | Freq: Once | ORAL | Status: AC
  Administered 2021-09-15: 1 via ORAL
  Filled 2021-09-15: qty 1

## 2021-09-15 NOTE — Discharge Instructions (Signed)
Fortunately, your x-rays show no new fractures or dislocations.  It is likely that you aggravated some of the chronic conditions that you have been suffering from already.  Please use your home pain medications, and I have sent you in a prescription for a muscle relaxer since you are also having muscle spasms.  Please follow-up with your surgeon

## 2021-09-15 NOTE — ED Provider Notes (Signed)
[facility] EMERGENCY DEPARTMENT Provider Note   CSN: [id number] Arrival date & time: 09/15/21  1009     History  Chief Complaint  Patient presents with   Victor [name] is a 51 y.o. 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  Patient states that he attempted to put his foot out of the car, slipped and fell backwards landing on his buttocks and lower back.  He denies head injury or loss of consciousness.  He endorses worsening radicular pain, muscle spasms and back pain.  He is followed by Ortho care for his chronic back problems and reports that he is scheduled for surgery next month.  He uses hydrocodone at home for pain.   Fall      Home Medications Prior to Admission medications   Medication Sig Start Date End Date Taking? Authorizing Provider  cyclobenzaprine (FLEXERIL) 10 MG tablet Take 1 tablet (10 mg total) by mouth 2 (two) times daily as needed for muscle spasms. 09/15/21  Yes Raynald Blend R, PA-C  ACCU-CHEK GUIDE test strip 1 each by Other route 2 (two) times daily. as directed 01/15/21   [provider]  Accu-Chek Softclix Lancets lancets 1 each by Other route in the morning and at bedtime. 01/15/21   [provider]  acetaminophen (TYLENOL) 325 MG tablet Take 2 tablets (650 mg total) by mouth every 6 (six) hours as needed for mild pain (or Fever >/= 101). 12/28/19   Maury Dus, MD  bromocriptine (PARLODEL) 2.5 MG tablet Take 1 tablet (2.5 mg total) by mouth at bedtime. 07/09/20   Romero Belling, MD  empagliflozin (JARDIANCE) 25 MG TABS tablet Take 1 tablet (25 mg total) by mouth daily before breakfast. 05/16/21   Romero Belling, MD  furosemide (LASIX) 20 MG tablet Take 1 tablet (20 mg total) by mouth daily. Take with 40 mg for a total of 60 mg 05/13/21    Mayers, Cari S, PA-C  furosemide (LASIX) 40 MG tablet Take 40 mg by mouth daily. Take with 20 mg for a total of 60 mg 01/10/20   [provider]  gabapentin (NEURONTIN) 300 MG capsule Take 300 mg by mouth every evening. 12/22/20   [provider]  glipiZIDE (GLUCOTROL) 5 MG tablet Take 1 tablet (5 mg total) by mouth daily before breakfast. 07/09/20   Romero Belling, MD  HYDROcodone-acetaminophen (NORCO/VICODIN) 5-325 MG tablet Take 0.5-1 tablets by mouth 2 (two) times daily. 07/16/21   [provider]  hydrOXYzine (ATARAX) 10 MG tablet Take 1 tablet (10 mg total) by mouth 3 (three) times daily as needed. 05/13/21   Mayers, Cari S, PA-C  insulin glargine (LANTUS SOLOSTAR) 100 UNIT/ML Solostar Pen Inject 15 Units into the skin every morning. And pen needles 1/day 07/23/21   Romero Belling, MD  Insulin Pen Needle (NOVOFINE) 30G X 8 MM MISC Inject 10 each into the skin as needed. 07/30/21   Romero Belling, MD  Insulin Pen Needle (PEN NEEDLES) 32G X 4 MM MISC Inject 10 each into the skin as needed 07/31/21   Romero Belling, MD  losartan (COZAAR) 25 MG tablet Take 25 mg by mouth daily. 01/12/20   [provider]  meloxicam (MOBIC) 7.5 MG tablet Take 1 tablet (7.5 mg total) by mouth daily. 06/05/21   Hoy Register, MD  metFORMIN (GLUCOPHAGE-XR) 500 MG 24 hr tablet Take 2 tablets (1,000 mg total) by mouth daily. 05/16/21   Romero BellingEllison, Sean, MD  methocarbamol (ROBAXIN) 500 MG tablet Take 1 tablet (500 mg total) by mouth 4 (four) times daily. 06/05/21   Hoy RegisterNewlin, Enobong, MD  metoprolol succinate (TOPROL-XL) 25 MG 24 hr tablet Take 0.5 tablets (12.5 mg total) by mouth daily. 02/19/20   Tobb, Kardie, DO  potassium chloride SA (KLOR-CON) 20 MEQ tablet Take 20 mEq by mouth daily.    [provider]  rosuvastatin (CRESTOR) 10 MG tablet Take 10 mg by mouth daily.    [provider]  sertraline (ZOLOFT) 50 MG tablet Take 50 mg by mouth daily.    [provider]   triamcinolone cream (KENALOG) 0.1 % Apply 1 application topically 2 (two) times daily. 05/13/21   Mayers, Cari S, PA-C  TRULICITY 4.5 MG/0.5ML SOPN Inject 4.5 mg into the skin once a week. 05/16/21   Romero BellingEllison, Sean, MD  witch hazel-glycerin (TUCKS) pad Place rectally. 03/05/20   [provider]      Allergies    Shellfish-derived products and Coconut (cocos nucifera)    Review of Systems   Review of Systems  Physical Exam Updated Vital Signs BP 107/75 (BP Location: Right Arm)   Pulse 67   Temp 97.7 F (36.5 C) (Oral)   Resp 16   SpO2 100%  Physical Exam Vitals and nursing note reviewed.  Constitutional:      General: He is not in acute distress.    Appearance: He is not ill-appearing.  HENT:     Head: Atraumatic.  Eyes:     Conjunctiva/sclera: Conjunctivae normal.  Cardiovascular:     Rate and Rhythm: Normal rate and regular rhythm.     Pulses: Normal pulses.     Heart sounds: No murmur heard. Pulmonary:     Effort: Pulmonary effort is normal. No respiratory distress.     Breath sounds: Normal breath sounds.  Abdominal:     General: Abdomen is flat. There is no distension.     Palpations: Abdomen is soft.     Tenderness: There is no abdominal tenderness.  Musculoskeletal:        General: Normal range of motion.     Cervical back: Normal range of motion.     Comments: Patient is ambulatory although appears uncomfortable and uses cane.  Midline lumbar tenderness, and left lumbar tenderness.  Requires assistance putting feet up in the bed  Skin:    General: Skin is warm and dry.     Capillary Refill: Capillary refill takes less than 2 seconds.  Neurological:     General: No focal deficit present.     Mental Status: He is alert.  Psychiatric:        Mood and Affect: Mood normal.    ED Results / Procedures / Treatments   Labs (all labs ordered are listed, but only abnormal results are displayed) Labs Reviewed - No data to  display  EKG None  Radiology DG Thoracic Spine 2 View  Result Date: 09/15/2021 CLINICAL DATA:  Recent fall landing on the buttocks. Mid and low back pain. EXAM: THORACIC SPINE 2 VIEWS COMPARISON:  Prior lumbar spine radiographs. Lateral chest radiograph dated 01/07/2020. FINDINGS: No fracture, bone lesion or spondylolisthesis. Mild loss of disc height along the mid to lower thoracic spine with small anterior endplate osteophytes. Soft tissues are unremarkable. IMPRESSION: 1. No fracture or acute finding.  No malalignment. 2. Mild disc degenerative  changes. Electronically Signed   By: Amie Portland M.D.   On: 09/15/2021 11:28   DG Lumbar Spine Complete  Result Date: 09/15/2021 CLINICAL DATA:  Fall, landing on the buttocks.  Back pain. EXAM: LUMBAR SPINE - COMPLETE 4+ VIEW COMPARISON:  06/12/2021.  Lumbar MRI, 07/09/2021. FINDINGS: No fracture, bone lesion or spondylolisthesis. Mild loss of disc height at L3-L4 and L4-L5. Remaining disc spaces are well preserved. Minor anterior endplate osteophytes from the lower thoracic spine through L5. Facet joints are relatively well maintained. Soft tissues are unremarkable. IMPRESSION: 1. No fracture or acute finding. 2. Mild disc degenerative changes as detailed, stable from the prior exam. Electronically Signed   By: Amie Portland M.D.   On: 09/15/2021 11:27    Procedures Procedures    Medications Ordered in ED Medications  HYDROcodone-acetaminophen (NORCO/VICODIN) 5-325 MG per tablet 1 tablet (1 tablet Oral Given 09/15/21 1051)    ED Course/ Medical Decision Making/ A&P                           Medical Decision Making Amount and/or Complexity of Data Reviewed Radiology: ordered.  Risk Prescription drug management.   51 year old male in no acute distress, nontoxic-appearing presents to the ED for evaluation of acutely aggravated chronic lower back pain that occurred after mechanical fall just prior to arrival.  Differentials include herniated  disc, vertebral fracture, hip fracture, hip dislocation, cauda equina.  Vitals without significant abnormality.  Patient is noted to be ambulatory with a cane although appears quite uncomfortable.  He has midline lumbar tenderness and tenderness over the left lumbar musculature.  During my exam, he did wince and cry out noting that he had a "muscle spasm".  No obvious spasm seen myself.  I ordered and interpreted x-ray of the lumbar and thoracic spine which shows his chronic degenerative changes but no acute fracture or abnormality.  I agree with radiologist interpretation.  Symptoms not consistent with cauda equina.  Patient has Norco at home for his pain.  We will also send him home with Flexeril for his muscle spasms.  He is to follow-up with his orthopedic physician for evaluation.  Patient expresses understanding and is amenable to plan.  Discharged home in good condition. Final Clinical Impression(s) / ED Diagnoses Final diagnoses:  Fall, initial encounter    Rx / DC Orders ED Discharge Orders          Ordered    cyclobenzaprine (FLEXERIL) 10 MG tablet  2 times daily PRN        09/15/21 1143              Delight Ovens 09/15/21 1257    Cheryll Cockayne, MD 09/27/21 1651

## 2021-09-15 NOTE — ED Triage Notes (Signed)
Pt had mechanical fall just PTA, slipping out of the transportation van d/t wearing slippery shoes. Has pain to L lower back and L buttocks. Did not hit head, no LOC.

## 2021-09-21 ENCOUNTER — Other Ambulatory Visit: Payer: Self-pay | Admitting: Physician Assistant

## 2021-09-21 DIAGNOSIS — I5033 Acute on chronic diastolic (congestive) heart failure: Secondary | ICD-10-CM

## 2021-09-22 ENCOUNTER — Other Ambulatory Visit: Payer: Self-pay

## 2021-09-22 DIAGNOSIS — E119 Type 2 diabetes mellitus without complications: Secondary | ICD-10-CM

## 2021-09-22 MED ORDER — RELION PEN NEEDLES 32G X 4 MM MISC: 3 refills | Status: DC

## 2021-09-22 MED ORDER — TRULICITY 4.5 MG/0.5ML ~~LOC~~ SOAJ: 4.5000 mg | SUBCUTANEOUS | 3 refills | Status: DC

## 2021-09-25 ENCOUNTER — Other Ambulatory Visit: Payer: Self-pay | Admitting: Physician Assistant

## 2021-09-25 DIAGNOSIS — I5033 Acute on chronic diastolic (congestive) heart failure: Secondary | ICD-10-CM

## 2021-09-26 NOTE — Telephone Encounter (Signed)
No history of medication request prescribed or refilled by Georganna Skeans, MD.

## 2021-10-01 ENCOUNTER — Ambulatory Visit: Payer: Medicaid Other | Attending: Family Medicine | Admitting: Family Medicine

## 2021-10-01 ENCOUNTER — Encounter: Payer: Self-pay | Admitting: Family Medicine

## 2021-10-01 ENCOUNTER — Other Ambulatory Visit: Payer: Self-pay | Admitting: Physician Assistant

## 2021-10-01 ENCOUNTER — Telehealth: Payer: Self-pay | Admitting: Family Medicine

## 2021-10-01 VITALS — BP 113/82 | HR 74 | Temp 98.2°F | Ht 71.0 in | Wt 258.2 lb

## 2021-10-01 DIAGNOSIS — E1149 Type 2 diabetes mellitus with other diabetic neurological complication: Secondary | ICD-10-CM

## 2021-10-01 DIAGNOSIS — B353 Tinea pedis: Secondary | ICD-10-CM

## 2021-10-01 DIAGNOSIS — E1159 Type 2 diabetes mellitus with other circulatory complications: Secondary | ICD-10-CM | POA: Diagnosis not present

## 2021-10-01 DIAGNOSIS — I872 Venous insufficiency (chronic) (peripheral): Secondary | ICD-10-CM

## 2021-10-01 DIAGNOSIS — M47816 Spondylosis without myelopathy or radiculopathy, lumbar region: Secondary | ICD-10-CM

## 2021-10-01 DIAGNOSIS — F419 Anxiety disorder, unspecified: Secondary | ICD-10-CM

## 2021-10-01 DIAGNOSIS — I5033 Acute on chronic diastolic (congestive) heart failure: Secondary | ICD-10-CM

## 2021-10-01 DIAGNOSIS — I152 Hypertension secondary to endocrine disorders: Secondary | ICD-10-CM

## 2021-10-01 MED ORDER — ROSUVASTATIN CALCIUM 10 MG PO TABS: 10.0000 mg | ORAL_TABLET | Freq: Every day | ORAL | 1 refills | Status: DC

## 2021-10-01 MED ORDER — FUROSEMIDE 40 MG PO TABS: 40.0000 mg | ORAL_TABLET | Freq: Every day | ORAL | 3 refills | Status: DC

## 2021-10-01 MED ORDER — CLOTRIMAZOLE 1 % EX CREA: 1.0000 "application " | TOPICAL_CREAM | Freq: Two times a day (BID) | CUTANEOUS | 1 refills | Status: DC

## 2021-10-01 NOTE — Patient Instructions (Signed)
Edema ? ?Edema is when you have too much fluid in your body or under your skin. Edema may make your legs, feet, and ankles swell. Swelling often happens in looser tissues, such as around your eyes. This is a common condition. It gets more common as you get older. ?There are many possible causes of edema. These include: ?Eating too much salt (sodium). ?Being on your feet or sitting for a long time. ?Certain medical conditions, such as: ?Pregnancy. ?Heart failure. ?Liver disease. ?Kidney disease. ?Cancer. ?Hot weather may make edema worse. Edema is usually painless. Your skin may look swollen or shiny. ?Follow these instructions at home: ?Medicines ?Take over-the-counter and prescription medicines only as told by your doctor. ?Your doctor may prescribe a medicine to help your body get rid of extra water (diuretic). Take this medicine if you are told to take it. ?Eating and drinking ?Eat a low-salt (low-sodium) diet as told by your doctor. Sometimes, eating less salt may reduce swelling. ?Depending on the cause of your swelling, you may need to limit how much fluid you drink (fluid restriction). ?General instructions ?Raise the injured area above the level of your heart while you are sitting or lying down. ?Do not sit still or stand for a long time. ?Do not wear tight clothes. Do not wear garters on your upper legs. ?Exercise your legs. This can help the swelling go down. ?Wear compression stockings as told by your doctor. It is important that these are the right size. These should be prescribed by your doctor to prevent possible injuries. ?If elastic bandages or wraps are recommended, use them as told by your doctor. ?Contact a doctor if: ?Treatment is not working. ?You have heart, liver, or kidney disease and have symptoms of edema. ?You have sudden and unexplained weight gain. ?Get help right away if: ?You have shortness of breath or chest pain. ?You cannot breathe when you lie down. ?You have pain, redness, or  warmth in the swollen areas. ?You have heart, liver, or kidney disease and get edema all of a sudden. ?You have a fever and your symptoms get worse all of a sudden. ?These symptoms may be an emergency. Get help right away. Call 911. ?Do not wait to see if the symptoms will go away. ?Do not drive yourself to the hospital. ?Summary ?Edema is when you have too much fluid in your body or under your skin. ?Edema may make your legs, feet, and ankles swell. Swelling often happens in looser tissues, such as around your eyes. ?Raise the injured area above the level of your heart while you are sitting or lying down. ?Follow your doctor's instructions about diet and how much fluid you can drink. ?This information is not intended to replace advice given to you by your health care provider. Make sure you discuss any questions you have with your health care provider. ?Document Revised: 12/09/2020 Document Reviewed: 12/09/2020 ?Elsevier Patient Education ? 2023 [employer] ? ?

## 2021-10-01 NOTE — Telephone Encounter (Signed)
Pt inquiring about service dog, pt wants to get a head start on this prior to him losing his eye sight in the future. Please contact pt with advice on how to start this process. [phone]

## 2021-10-01 NOTE — Progress Notes (Signed)
Having pain in tailbone due to recent fall.

## 2021-10-01 NOTE — Telephone Encounter (Signed)
No encounters with Amelia Wilson, MD. 

## 2021-10-01 NOTE — Progress Notes (Signed)
Subjective:  Patient ID: Victor [name], male    DOB: 29-Nov-1970  Age: 51 y.o. MRN: [id number]  CC: Fall   HPI Victor [name] is a 51 y.o. year old male with a history of type 2 diabetes mellitus (A1c 8.9, diabetes is managed by endocrine), herniated lumbar disc with radiculopathy, Anxiety and Depression ( managed by Dr Delton [facility], Dr A). 2 weeks ago he was seen at the ED after a fall while getting out of the wheelchair van.  Notes reviewed with no suspicion for cauda equina syndrome and he was subsequently discharged with Flexeril to continue with his home prescription of Norco.  Interval History: His tail bone continues to hurt and it feels like someone is sticking him. Last seen by Dr. Otelia SergeantNitka due to back pain from herniated lumbar disc and notes reviewed with plans for elective surgery-1 level lumbar left L3-4 hemilaminectomy with probable discectomy.  He has an upcoming appointment with nutritionist at the endocrine clinic tomorrow but is unsure of when his next appointment with endocrine is.  Last appointment with Dr. Everardo AllEllison was in 07/2021.  He will also need glycemic control prior to his back surgery.  Endorses adherence with his diabetes regimen. He does have peeling of the skin of his feet and has used OTC athlete's foot creams with no improvement.  Anxiety and depression is managed by [facility] and he is doing well from a mental health standpoint.  He continues to experience pedal edema even though he elevates his legs.  Currently on 60 mg of Lasix daily.  His chart reveals he was previously seen by vascular and diagnosed with chronic venous insufficiency.  He states his pharmacy does not have some of the medications he should be taking.  [place] moved here from [place] and did have a good supply of his medications early on in the year but I am going through his medication list with him I am not able to verify that he is taking all that appears on his med list. Past Medical History:  Diagnosis Date   Acute  on chronic congestive heart failure (HCC)    Arthritis    Gout    Hay fever    High cholesterol    Hypertension    Mental health problem    Near syncope 12/27/2019   Pre-syncope 12/27/2019   Shortness of breath 02/06/2020   Syncope 12/26/2019   Type 2 diabetes mellitus without complication, without long-term current use of insulin (HCC)    Unstable angina (HCC) 02/06/2020    Past Surgical History:  Procedure Laterality Date   EYE SURGERY     HEMORROIDECTOMY     RIGHT/LEFT HEART CATH AND CORONARY ANGIOGRAPHY N/A 02/06/2020   Procedure: RIGHT/LEFT HEART CATH AND CORONARY ANGIOGRAPHY;  Surgeon: Marykay LexHarding, David W, MD;  [facility];  Service: Cardiovascular;  Laterality: N/A;   WISDOM TOOTH EXTRACTION      Family History  Problem Relation Age of Onset   Diabetes Mother    Hypertension Father    Hypertension Brother    Hypertension Maternal Grandfather    Hypertension Paternal Grandmother     Social History   Socioeconomic History   Marital status: Divorced    Spouse name: Not on file   Number of children: Not on file   Years of education: Not on file   Highest education level: Not on file  Occupational History   Not on file  Tobacco Use   Smoking status: Former    Types: Cigarettes  Quit date: 04/20/2000    Years since quitting: 21.4   Smokeless tobacco: Never  Vaping Use   Vaping Use: Never used  Substance and Sexual Activity   Alcohol use: Never   Drug use: Never   Sexual activity: Not Currently  Other Topics Concern   Not on file  Social History Narrative   Works 3rd shift    Social Determinants of Corporate investment banker Strain: Not on file  Food Insecurity: Not on file  Transportation Needs: Not on file  Physical Activity: Not on file  Stress: Not on file  Social Connections: Not on file    Allergies  Allergen Reactions   Shellfish-Derived Products Hives   Coconut (Cocos Nucifera) Hives and Rash    Outpatient Medications  Prior to Visit  Medication Sig Dispense Refill   ACCU-CHEK GUIDE test strip 1 each by Other route 2 (two) times daily. as directed     Accu-Chek Softclix Lancets lancets 1 each by Other route in the morning and at bedtime.     acetaminophen (TYLENOL) 325 MG tablet Take 2 tablets (650 mg total) by mouth every 6 (six) hours as needed for mild pain (or Fever >/= 101).     bromocriptine (PARLODEL) 2.5 MG tablet Take 1 tablet (2.5 mg total) by mouth at bedtime. 90 tablet 3   empagliflozin (JARDIANCE) 25 MG TABS tablet Take 1 tablet (25 mg total) by mouth daily before breakfast. 90 tablet 3   furosemide (LASIX) 20 MG tablet Take 1 tablet (20 mg total) by mouth daily. Take with 40 mg for a total of 60 mg 30 tablet 1   gabapentin (NEURONTIN) 300 MG capsule Take 300 mg by mouth every evening.     glipiZIDE (GLUCOTROL) 5 MG tablet Take 1 tablet (5 mg total) by mouth daily before breakfast. 90 tablet 3   HYDROcodone-acetaminophen (NORCO/VICODIN) 5-325 MG tablet Take 0.5-1 tablets by mouth 2 (two) times daily.     insulin glargine (LANTUS SOLOSTAR) 100 UNIT/ML Solostar Pen Inject 15 Units into the skin every morning. And pen needles 1/day 15 mL PRN   Insulin Pen Needle (NOVOFINE) 30G X 8 MM MISC Inject 10 each into the skin as needed. 100 each 6   Insulin Pen Needle (PEN NEEDLES) 32G X 4 MM MISC Inject 10 each into the skin as needed 100 each 3   Insulin Pen Needle (RELION PEN NEEDLES) 32G X 4 MM MISC Inject 15 Units into the skin every morning 100 each 3   losartan (COZAAR) 25 MG tablet Take 25 mg by mouth daily.     meloxicam (MOBIC) 7.5 MG tablet Take 1 tablet (7.5 mg total) by mouth daily. 30 tablet 1   metFORMIN (GLUCOPHAGE-XR) 500 MG 24 hr tablet Take 2 tablets (1,000 mg total) by mouth daily. 180 tablet 3   methocarbamol (ROBAXIN) 500 MG tablet Take 1 tablet (500 mg total) by mouth 4 (four) times daily. 60 tablet 3   metoprolol succinate (TOPROL-XL) 25 MG 24 hr tablet Take 0.5 tablets (12.5 mg total)  by mouth daily. 45 tablet 1   potassium chloride SA (KLOR-CON) 20 MEQ tablet Take 20 mEq by mouth daily.     sertraline (ZOLOFT) 50 MG tablet Take 50 mg by mouth daily.     triamcinolone cream (KENALOG) 0.1 % Apply 1 application topically 2 (two) times daily. 80 g 0   TRULICITY 4.5 MG/0.5ML SOPN Inject 4.5 mg into the skin once a week. 6 mL 3   witch  hazel-glycerin (TUCKS) pad Place rectally.     cyclobenzaprine (FLEXERIL) 10 MG tablet Take 1 tablet (10 mg total) by mouth 2 (two) times daily as needed for muscle spasms. 20 tablet 0   furosemide (LASIX) 40 MG tablet Take 40 mg by mouth daily. Take with 20 mg for a total of 60 mg     hydrOXYzine (ATARAX) 10 MG tablet Take 1 tablet (10 mg total) by mouth 3 (three) times daily as needed. 30 tablet 0   rosuvastatin (CRESTOR) 10 MG tablet Take 10 mg by mouth daily.     No facility-administered medications prior to visit.     ROS Review of Systems  Constitutional:  Negative for activity change and appetite change.  HENT:  Negative for sinus pressure and sore throat.   Eyes:  Negative for visual disturbance.  Respiratory:  Negative for cough, chest tightness and shortness of breath.   Cardiovascular:  Positive for leg swelling. Negative for chest pain.  Gastrointestinal:  Negative for abdominal distention, abdominal pain, constipation and diarrhea.  Endocrine: Negative.   Genitourinary:  Negative for dysuria.  Musculoskeletal:  Positive for back pain. Negative for joint swelling and myalgias.  Skin:  Negative for rash.  Allergic/Immunologic: Negative.   Neurological:  Negative for weakness, light-headedness and numbness.  Psychiatric/Behavioral:  Negative for dysphoric mood and suicidal ideas.     Objective:  BP 113/82   Pulse 74   Temp 98.2 F (36.8 C) (Oral)   Ht 5\' 11"  (1.803 m)   Wt 258 lb 3.2 oz (117.1 kg)   SpO2 99%   BMI 36.01 kg/m      10/01/2021    2:47 PM 09/15/2021   11:56 AM 09/15/2021   10:15 AM  BP/Weight  Systolic  BP 113 09/17/2021 106  Diastolic BP 82 75 73  Wt. (Lbs) 258.2    BMI 36.01 kg/m2        Physical Exam Constitutional:      Appearance: He is well-developed. He is obese.  Cardiovascular:     Rate and Rhythm: Normal rate.     Heart sounds: Normal heart sounds. No murmur heard. Pulmonary:     Effort: Pulmonary effort is normal.     Breath sounds: Normal breath sounds. No wheezing or rales.  Chest:     Chest wall: No tenderness.  Abdominal:     General: Bowel sounds are normal. There is no distension.     Palpations: Abdomen is soft. There is no mass.     Tenderness: There is no abdominal tenderness.  Musculoskeletal:        General: Normal range of motion.     Right lower leg: Edema (2+ non pitting) present.     Left lower leg: Edema (1+ non pitting) present.  Neurological:     Mental Status: He is alert and oriented to person, place, and time.  Psychiatric:        Mood and Affect: Mood normal.        Latest Ref Rng & Units 07/01/2021    4:42 PM 05/16/2021    9:57 AM 02/06/2020    8:10 AM  CMP  Glucose 70 - 99 mg/dL 02/08/2020  034    BUN 6 - 24 mg/dL 14  17    Creatinine 742 - 1.27 mg/dL 5.95  6.38    Sodium 7.56 - 144 mmol/L 138  138  141   Potassium 3.5 - 5.2 mmol/L 4.0  4.4  3.8   Chloride 96 - 106  mmol/L 100  104    CO2 20 - 29 mmol/L 26  27    Calcium 8.7 - 10.2 mg/dL 9.8  9.9    Total Protein 6.0 - 8.5 g/dL 7.3     Total Bilirubin 0.0 - 1.2 mg/dL 0.3     Alkaline Phos 44 - 121 IU/L 115     AST 0 - 40 IU/L 19     ALT 0 - 44 IU/L 35       Lipid Panel     Component Value Date/Time   CHOL 168 12/27/2019 1228   TRIG 185 (H) 12/27/2019 1228   HDL 32 (L) 12/27/2019 1228   CHOLHDL 5.3 12/27/2019 1228   VLDL 37 12/27/2019 1228   LDLCALC 99 12/27/2019 1228    CBC    Component Value Date/Time   WBC 5.7 01/31/2020 1518   WBC 4.3 12/28/2019 0817   RBC 5.30 01/31/2020 1518   RBC 5.48 12/28/2019 0817   HGB 13.6 02/06/2020 0810   HGB 14.6 01/31/2020 1518   HCT 40.0  02/06/2020 0810   HCT 44.4 01/31/2020 1518   PLT 239 01/31/2020 1518   MCV 84 01/31/2020 1518   MCH 27.5 01/31/2020 1518   MCH 27.0 12/28/2019 0817   MCHC 32.9 01/31/2020 1518   MCHC 32.7 12/28/2019 0817   RDW 13.0 01/31/2020 1518    Lab Results  Component Value Date   HGBA1C 8.9 (A) 07/23/2021    Assessment & Plan:  1. Chronic venous insufficiency Uncontrolled Encouraged to continue using compression stockings and elevate feet - furosemide (LASIX) 40 MG tablet; Take 1 tablet (40 mg total) by mouth daily. Take with 20 mg for a total of 60 mg  Dispense: 30 tablet; Refill: 3  2. Tinea pedis of both feet Uncontrolled on topical OTC antifungal creams If clotrimazole is ineffective we will consider oral antifungal - clotrimazole (CLOTRIMAZOLE AF) 1 % cream; Apply 1 application  topically 2 (two) times daily.  Dispense: 60 g; Refill: 1  3. Other diabetic neurological complication associated with type 2 diabetes mellitus (HCC) Diabetes uncontrolled with A1c of 8.9; next A1c is due next month Advised to schedule appointment with his endocrinologist Neuropathy is stable Currently on gabapentin - rosuvastatin (CRESTOR) 10 MG tablet; Take 1 tablet (10 mg total) by mouth daily.  Dispense: 90 tablet; Refill: 1  4. Hypertension associated with diabetes (HCC) Controlled He does need medication reconciliation as some of his medications were documented as patient reported and I am unable to ascertain that he is taking everything He will come in with all his medications for clinical pharmacy visit and medication reconciliation  5. Lumbar spondylosis Uncontrolled Currently being worked up for elective surgery  6. Anxiety Controlled Currently on sertraline Management as per mental health   Health Care Maintenance: Per patient he has had a negative Cologuard test but I am unable to find the results.  He will obtain the results we can update his chart. Meds ordered this encounter   Medications   furosemide (LASIX) 40 MG tablet    Sig: Take 1 tablet (40 mg total) by mouth daily. Take with 20 mg for a total of 60 mg    Dispense:  30 tablet    Refill:  3   clotrimazole (CLOTRIMAZOLE AF) 1 % cream    Sig: Apply 1 application  topically 2 (two) times daily.    Dispense:  60 g    Refill:  1   rosuvastatin (CRESTOR) 10 MG tablet  Sig: Take 1 tablet (10 mg total) by mouth daily.    Dispense:  90 tablet    Refill:  1    Follow-up: Return in about 2 weeks (around 10/15/2021) for Medication reconciliation with Franky Macho, Chronic medical conditions with PCP in 21-month.       Hoy Register, MD, FAAFP. [facility] [place], [place] [phone]   10/01/2021, 4:55 PM

## 2021-10-02 ENCOUNTER — Encounter: Payer: Medicaid Other | Attending: Internal Medicine | Admitting: Dietician

## 2021-10-02 ENCOUNTER — Encounter: Payer: Self-pay | Admitting: Dietician

## 2021-10-02 DIAGNOSIS — E119 Type 2 diabetes mellitus without complications: Secondary | ICD-10-CM | POA: Diagnosis present

## 2021-10-02 NOTE — Patient Instructions (Addendum)
Great job on taking your insulin and other medications! Begin checking your blood glucose more consistently. Exercise as you are able  Physical therapy exercises  Armchair exercises (you tube)  Walking Take the skin off the chicken Aim for consistent meals about every 5 hours. Consider eating an earlier dinner Eat a protein with each meal (egg, cheese, chicken, nuts, peanut butter) Half your plate should be non starchy vegetables.

## 2021-10-02 NOTE — Progress Notes (Signed)
Diabetes Self-Management Education  Visit Type: Follow-up  Appt. Start Time: 0905 Appt. End Time: 1005  10/02/2021  [name], identified by name and date of birth, is a 51 y.o. male with a diagnosis of Diabetes:  .   ASSESSMENT Patient is here today alone.  He was last seen by this RD on 07/24/2021. He states that his MD is going to schedule his back surgery. Fasting glucose 158 He had an eye exam.  His vision is improving and thinks his glucose has improved.  Dx with glaucoma. He has been giving the insulin injections He has not been checking his blood glucose like he should but thinks that his glucose is lower as he no longer has increased thirst and frequent urination. He stopped eating beef and pork except once per month. Bakes and boils, avoids added salt. Gets food stamps as well as food from the food pantry which is a lot of canned food.   He states that his appetite has decreased and he is cooking less volume and still throwing food away.  He has to turn in some documentation to continue to get food stamps.  History includes Type 2 Diabetes (01/2020), neuropathy, glaucoma, HTN, HLD, CHF Medications include:  glipizide, Jardiance, Metformin, Trulicity, and is to start Lantus 15 units q am, lasix, potassium A1C:  8.9% 07/23/2021, 8.6% 05/16/2021 Patient lives alone.  He does his own shopping and cooking. He is going to physical therapy due to his back (sciatica) and states that he will need surgery soon. He is on disability.  He is concerned about his health and has little support.  We spoke about him texting his sister daily and developing a safety plan. Allergic to shellfish and coconut oil.  Weight 256 lb (116.1 kg). Body mass index is 35.7 kg/m.   Diabetes Self-Management Education - 10/02/21 1740       Visit Information   Visit Type Follow-up      Psychosocial Assessment   Other persons present Patient      Pre-Education Assessment   Patient understands  the diabetes disease and treatment process. Needs Review    Patient understands incorporating nutritional management into lifestyle. Needs Review    Patient undertands incorporating physical activity into lifestyle. Needs Review    Patient understands using medications safely. Needs Review    Patient understands monitoring blood glucose, interpreting and using results Needs Review    Patient understands prevention, detection, and treatment of acute complications. Needs Review    Patient understands prevention, detection, and treatment of chronic complications. Needs Review    Patient understands how to develop strategies to address psychosocial issues. Needs Review    Patient understands how to develop strategies to promote health/change behavior. Needs Review      Complications   How often do you check your blood sugar? --   infrequent     Dietary Intake   Breakfast oatmeal with trail mix or PB, 2 eggs    Lunch skips most often    Dinner salad with steak, ranch dressing    Beverage(s) water, diet soda, flavored water, occasional coffee with sweet and low and powdered creamer      Activity / Exercise   Activity / Exercise Type ADL's      Patient Education   Previous Diabetes Education Yes (please comment)   07/2021   Healthy Eating Meal options for control of blood glucose level and chronic complications.;Food label reading, portion sizes and measuring food.;Plate Method  Being Active Helped patient identify appropriate exercises in relation to his/her diabetes, diabetes complications and other health issue.    Medications Reviewed patients medication for diabetes, action, purpose, timing of dose and side effects.    Monitoring Identified appropriate SMBG and/or A1C goals.    Diabetes Stress and Support Worked with patient to identify barriers to care and solutions      Individualized Goals (developed by patient)   Nutrition General guidelines for healthy choices and portions  discussed    Physical Activity Exercise 3-5 times per week;15 minutes per day    Medications take my medication as prescribed    Monitoring  Test my blood glucose as discussed    Problem Solving Addressing barriers to behavior change    Reducing Risk examine blood glucose patterns;do foot checks daily      Patient Self-Evaluation of Goals - Patient rates self as meeting previously set goals (% of time)   Nutrition 50 - 75 % (half of the time)    Physical Activity Not Applicable    Medications >75% (most of the time)    Monitoring < 25% (hardly ever/never)    Problem Solving and behavior change strategies  50 - 75 % (half of the time)    Reducing Risk (treating acute and chronic complications) 50 - 75 % (half of the time)    Health Coping >75% (most of the time)      Post-Education Assessment   Patient understands the diabetes disease and treatment process. Comprehends key points    Patient understands incorporating nutritional management into lifestyle. Comprehends key points    Patient undertands incorporating physical activity into lifestyle. Comprehends key points    Patient understands using medications safely. Comphrehends key points    Patient understands monitoring blood glucose, interpreting and using results Comprehends key points    Patient understands prevention, detection, and treatment of acute complications. Comprehends key points    Patient understands prevention, detection, and treatment of chronic complications. Comprehends key points    Patient understands how to develop strategies to address psychosocial issues. Comprehends key points    Patient understands how to develop strategies to promote health/change behavior. Comprehends key points      Outcomes   Expected Outcomes Demonstrated interest in learning but significant barriers to change    Future DMSE 3-4 months    Program Status Not Completed      Subsequent Visit   Since your last visit have you continued or  begun to take your medications as prescribed? Yes    Since your last visit have you experienced any weight changes? Gain             Individualized Plan for Diabetes Self-Management Training:   Learning Objective:  Patient will have a greater understanding of diabetes self-management. Patient education plan is to attend individual and/or group sessions per assessed needs and concerns.   Plan:   Patient Instructions  Randie Heinz job on taking your insulin and other medications! Begin checking your blood glucose more consistently. Exercise as you are able  Physical therapy exercises  Armchair exercises (you tube)  Walking Take the skin off the chicken Aim for consistent meals about every 5 hours. Consider eating an earlier dinner Eat a protein with each meal (egg, cheese, chicken, nuts, peanut butter) Half your plate should be non starchy vegetables.   Expected Outcomes:  Demonstrated interest in learning but significant barriers to change  Education material provided: Meal plan card and Snack sheet  If  problems or questions, patient to contact team via:  Phone  Future DSME appointment: 3-4 months

## 2021-10-13 ENCOUNTER — Ambulatory Visit (INDEPENDENT_AMBULATORY_CARE_PROVIDER_SITE_OTHER): Payer: Medicaid Other | Admitting: Specialist

## 2021-10-13 ENCOUNTER — Encounter: Payer: Self-pay | Admitting: Specialist

## 2021-10-13 VITALS — BP 118/83 | HR 96 | Ht 71.0 in | Wt 252.5 lb

## 2021-10-13 DIAGNOSIS — M5116 Intervertebral disc disorders with radiculopathy, lumbar region: Secondary | ICD-10-CM | POA: Diagnosis not present

## 2021-10-13 DIAGNOSIS — M5136 Other intervertebral disc degeneration, lumbar region: Secondary | ICD-10-CM | POA: Diagnosis not present

## 2021-10-13 DIAGNOSIS — M48062 Spinal stenosis, lumbar region with neurogenic claudication: Secondary | ICD-10-CM | POA: Diagnosis not present

## 2021-10-13 DIAGNOSIS — M4726 Other spondylosis with radiculopathy, lumbar region: Secondary | ICD-10-CM

## 2021-10-13 NOTE — Progress Notes (Signed)
Office Visit Note   Patient: Victor [name]           Date of Birth: [birth year]           MRN: [id number] Visit Date: 10/13/2021              Requested by: Hoy Register, MD [address] [place],  [place] [address] PCP: Hoy Register, MD   Assessment & Plan: Visit Diagnoses:  1. Degenerative disc disease, lumbar   2. Herniation of lumbar intervertebral disc with radiculopathy   3. Spinal stenosis of lumbar region with neurogenic claudication   4. Other spondylosis with radiculopathy, lumbar region     Plan: Avoid bending, stooping and avoid lifting weights greater than 10 lbs. Avoid prolong standing and walking. Order for a new walker with wheels. Surgery scheduling secretary Tivis Ringer, will call you in the next week to schedule for surgery.  Surgery recommended is a one level lumbar left L3-4 hemilaminectomy with probable discectomy this would be done with microscope. Take hydrocodone for for pain. Risk of surgery includes risk of infection 1 in 200 patients, bleeding 1/100% chance you would need a transfusion.   Risk to the nerves is one in 10,000.   Expect improved walking and standing tolerance. Expect relief of leg pain but numbness may persist depending on the length and degree of pressure that has been present.   Follow-Up Instructions: Return in about 4 weeks (around 10/08/2021).    Orders:  No orders of the defined types were placed in this encounter.   No orders of the defined types were placed in this encounter.  Follow-Up Instructions: No follow-ups on file.   Orders:  No orders of the defined types were placed in this encounter.  No orders of the defined types were placed in this encounter.     Procedures: No procedures performed   Clinical Data: No additional findings.   Subjective: Chief Complaint  Patient presents with   Lower Back - Follow-up    51 year old male with history of back pain last    Review of Systems   Constitutional: Negative.   HENT: Negative.    Eyes: Negative.   Respiratory: Negative.    Cardiovascular: Negative.   Gastrointestinal: Negative.   Endocrine: Negative.   Genitourinary: Negative.   Musculoskeletal: Negative.   Skin: Negative.   Allergic/Immunologic: Negative.   Neurological: Negative.   Hematological: Negative.   Psychiatric/Behavioral: Negative.       Objective: Vital Signs: BP 118/83 (BP Location: Left Arm, Patient Position: Sitting)   Pulse 96   Ht 5\' 11"  (1.803 m)   Wt 252 lb 8 oz (114.5 kg)   BMI 35.22 kg/m   Physical Exam Constitutional:      Appearance: He is well-developed.  HENT:     Head: Normocephalic and atraumatic.  Eyes:     Pupils: Pupils are equal, round, and reactive to light.  Pulmonary:     Effort: Pulmonary effort is normal.     Breath sounds: Normal breath sounds.  Abdominal:     General: Bowel sounds are normal.     Palpations: Abdomen is soft.  Musculoskeletal:     Cervical back: Normal range of motion and neck supple.     Lumbar back: Negative right straight leg raise test and negative left straight leg raise test.  Skin:    General: Skin is warm and dry.  Neurological:     Mental Status: He is alert and oriented  to person, place, and time.  Psychiatric:        Behavior: Behavior normal.        Thought Content: Thought content normal.        Judgment: Judgment normal.     Back Exam   Tenderness  The patient is experiencing tenderness in the lumbar.  Range of Motion  Extension:  abnormal  Flexion:  abnormal  Lateral bend right:  abnormal  Lateral bend left:  abnormal  Rotation right:  abnormal  Rotation left:  abnormal   Muscle Strength  Right Quadriceps:  5/5  Left Quadriceps:  5/5  Right Hamstrings:  5/5  Left Hamstrings:  5/5   Tests  Straight leg raise right: negative Straight leg raise left: negative  Reflexes  Patellar:  0/4  Other  Toe walk: normal Heel walk: normal      Specialty  Comments:  No specialty comments available.  Imaging: No results found.   PMFS History: Patient Active Problem List   Diagnosis Date Noted   Chronic venous insufficiency 10/01/2021   Anxiety 06/05/2021   Bedbug bite 05/13/2021   Localized edema 05/13/2021   PSVT (paroxysmal supraventricular tachycardia) (HCC) 02/19/2020   NSVT (nonsustained ventricular tachycardia) (HCC) 02/19/2020   Obesity (BMI 30-39.9) 02/19/2020   Shortness of breath 02/06/2020   Unstable angina (HCC) 02/06/2020   Mental health problem    Hypertension    High cholesterol    Hay fever    Gout    Arthritis    Type 2 diabetes mellitus without complication, without long-term current use of insulin (HCC)    Near syncope 12/27/2019   Pre-syncope 12/27/2019   Acute on chronic congestive heart failure (HCC)    Syncope 12/26/2019   Past Medical History:  Diagnosis Date   Acute on chronic congestive heart failure (HCC)    Arthritis    Gout    Hay fever    High cholesterol    Hypertension    Mental health problem    Near syncope 12/27/2019   Pre-syncope 12/27/2019   Shortness of breath 02/06/2020   Syncope 12/26/2019   Type 2 diabetes mellitus without complication, without long-term current use of insulin (HCC)    Unstable angina (HCC) 02/06/2020    Family History  Problem Relation Age of Onset   Diabetes Mother    Hypertension Father    Hypertension Brother    Hypertension Maternal Grandfather    Hypertension Paternal Grandmother     Past Surgical History:  Procedure Laterality Date   EYE SURGERY     HEMORROIDECTOMY     RIGHT/LEFT HEART CATH AND CORONARY ANGIOGRAPHY N/A 02/06/2020   Procedure: RIGHT/LEFT HEART CATH AND CORONARY ANGIOGRAPHY;  Surgeon: Marykay Lex, MD;  Location: [facility] INVASIVE CV LAB;  Service: Cardiovascular;  Laterality: N/A;   WISDOM TOOTH EXTRACTION     Social History   Occupational History   Not on file  Tobacco Use   Smoking status: Former    Types: Cigarettes     Quit date: 04/20/2000    Years since quitting: 21.4   Smokeless tobacco: Never  Vaping Use   Vaping Use: Never used  Substance and Sexual Activity   Alcohol use: Never   Drug use: Never   Sexual activity: Not Currently

## 2021-11-07 ENCOUNTER — Ambulatory Visit: Payer: Medicaid Other | Attending: Family Medicine | Admitting: Pharmacist

## 2021-11-07 DIAGNOSIS — I5033 Acute on chronic diastolic (congestive) heart failure: Secondary | ICD-10-CM | POA: Diagnosis not present

## 2021-11-07 DIAGNOSIS — Z7189 Other specified counseling: Secondary | ICD-10-CM | POA: Diagnosis not present

## 2021-11-07 MED ORDER — ACCU-CHEK GUIDE VI STRP: 1.0000 | ORAL_STRIP | Freq: Two times a day (BID) | 2 refills | Status: DC

## 2021-11-07 MED ORDER — FUROSEMIDE 20 MG PO TABS: 20.0000 mg | ORAL_TABLET | Freq: Every day | ORAL | 1 refills | Status: DC

## 2021-11-07 MED ORDER — ACCU-CHEK GUIDE W/DEVICE KIT: PACK | 0 refills | Status: DC

## 2021-11-07 MED ORDER — ACCU-CHEK SOFTCLIX LANCETS MISC: 1.0000 | Freq: Two times a day (BID) | 2 refills | Status: DC

## 2021-11-07 NOTE — Progress Notes (Signed)
  S:      No chief complaint on file.   Victor [name] is a 51 y.o. male who presents for medication reconciliation due to concern of polypharmacy. PMH is significant for congestive heart failure, chronic venous insufficiency, hypertension, hyperlipidemia, diabetes mellitus, type 2, and obesity. Patient was referred and last seen by Primary Care Provider, Dr. Alvis Lemmings, on 10/01/2021. There was confusion regarding his home regimen. He was asked to return to clinic to review his medications with me.    He brings his medications with him today. He tells me that if it is not here, he is not taking it. Of note, he does not have and is not taking the following: APAP, bromocriptine, gabapentin, glipizide regular release (he is taking the XL tab), losartan, metoprolol.  He brings and is taking the following: Accu Chek Guide products, clotrimazole cream, empagliflozin, furosemide, glipizide 5 mg XL, Norco, Lantus, metformin XR, methocarbamol, Kcl, rosuvastatin, sertraline, TAC, and Trulicity.     Medication Adherence Questionnaire:    Do you know what each of your medicines is for? yes Do you feel that your medications are working for you? no - would like to come off of additional medications.  Have you been experiencing any side effects to the medications prescribed? no Do you ever have trouble remembering to take your medicine? no How many days in the past week did you miss taking your medicines? none Do you ever not take a medicine because you feel you do not need it? Mp Do you have any problems obtaining medications due to finances? Yes Do you have any problems obtaining medications due to transportation? no In the past 6 months, have you missed getting a refill or a new prescription filled on time? no Do you have any physical problems such as vision loss or trouble opening bottles that keep you from taking your medicines as prescribed? no   O:    There were no vitals filed for this visit.      A/P: Polypharmacy:  Understanding of regimen: good Understanding of indications: good Potential of compliance: present Patient has known adherence challenges such as desire to come off of medications d/t lack of perceived benefit. Medication list reviewed and updated.   Written patient instructions and updated medication list provided. Total time in face to face counseling 30 minutes.     Follow up Pharmacist Clinic Visit in 1 month.   Butch Penny, PharmD, Patsy Baltimore, CPP Clinical Pharmacist [facility] [phone]

## 2021-11-14 ENCOUNTER — Encounter: Payer: Self-pay | Admitting: Specialist

## 2021-11-14 ENCOUNTER — Ambulatory Visit (INDEPENDENT_AMBULATORY_CARE_PROVIDER_SITE_OTHER): Payer: Medicaid Other | Admitting: Specialist

## 2021-11-14 VITALS — BP 108/72 | HR 80 | Ht 71.0 in | Wt 252.0 lb

## 2021-11-14 DIAGNOSIS — M48062 Spinal stenosis, lumbar region with neurogenic claudication: Secondary | ICD-10-CM | POA: Diagnosis not present

## 2021-11-14 DIAGNOSIS — M5136 Other intervertebral disc degeneration, lumbar region: Secondary | ICD-10-CM

## 2021-11-14 DIAGNOSIS — M5116 Intervertebral disc disorders with radiculopathy, lumbar region: Secondary | ICD-10-CM

## 2021-11-14 DIAGNOSIS — M4726 Other spondylosis with radiculopathy, lumbar region: Secondary | ICD-10-CM | POA: Diagnosis not present

## 2021-11-14 DIAGNOSIS — M5442 Lumbago with sciatica, left side: Secondary | ICD-10-CM

## 2021-11-14 DIAGNOSIS — M5441 Lumbago with sciatica, right side: Secondary | ICD-10-CM

## 2021-11-14 NOTE — Progress Notes (Signed)
Office Visit Note   Patient: Victor [name]           Date of Birth: May 24, 1970           MRN: [id number] Visit Date: 11/14/2021              Requested by: Hoy Register, MD [address] [place],  [place] [address] PCP: Hoy Register, MD   Assessment & Plan: Visit Diagnoses:  1. Degenerative disc disease, lumbar   2. Herniation of lumbar intervertebral disc with radiculopathy   3. Spinal stenosis of lumbar region with neurogenic claudication   4. Other spondylosis with radiculopathy, lumbar region   5. Acute bilateral low back pain with bilateral sciatica     Plan: Has diabetes that is out of control with HgBA1c over 8 and last check. When HgBA1c is below 8 then surgery may be considered to treat his condition. I had recommended considering hemilaminectomy for lateral recess stenosis and I am  Reluctant to do fusion surgery as I believe that it would likely lead to increasingly more surgical  Fusions due to accelerated DDD.  Avoid frequent bending and stooping  No lifting greater than 10 lbs. May use ice or moist heat for pain. Weight loss is of benefit. Best medication for lumbar disc disease is arthritis medications like motrin, celebrex and naprosyn. Exercise is important to improve your indurance and does allow people to function better inspite of back pain.    Follow-Up Instructions: Return in about 6 weeks (around 12/26/2021).   Orders:  No orders of the defined types were placed in this encounter.  No orders of the defined types were placed in this encounter.     Procedures: No procedures performed   Clinical Data: No additional findings.   Subjective: Chief Complaint  Patient presents with   Lower Back - Follow-up    51 year old male with diabetes and HgBA1c 8.6 he is now taking insulin to decrease his baseline blood glucose. Complains of pain in the back and into the leg left side into the buttock and middle of left hip. No bowel or  bladder difficulty. He uses a cane. Reports a mechanical fall last month while getting off a transportation vehicle for handicapped and he report falling While walking and slipping on a wet area of  the bus.   Review of Systems  Constitutional: Negative.   HENT: Negative.    Eyes: Negative.   Respiratory: Negative.    Cardiovascular: Negative.   Gastrointestinal: Negative.   Endocrine: Negative.   Genitourinary: Negative.   Musculoskeletal: Negative.   Skin: Negative.   Allergic/Immunologic: Negative.   Neurological: Negative.   Hematological: Negative.   Psychiatric/Behavioral: Negative.       Objective: Vital Signs: BP 108/72 (BP Location: Left Arm, Patient Position: Sitting, Cuff Size: Large)   Pulse 80   Ht 5\' 11"  (1.803 m)   Wt 252 lb (114.3 kg)   BMI 35.15 kg/m   Physical Exam Constitutional:      Appearance: He is well-developed.  HENT:     Head: Normocephalic and atraumatic.  Eyes:     Pupils: Pupils are equal, round, and reactive to light.  Pulmonary:     Effort: Pulmonary effort is normal.     Breath sounds: Normal breath sounds.  Abdominal:     General: Bowel sounds are normal.     Palpations: Abdomen is soft.  Musculoskeletal:     Cervical back: Normal range of motion and  neck supple.     Lumbar back: Negative right straight leg raise test and negative left straight leg raise test.  Skin:    General: Skin is warm and dry.  Neurological:     Mental Status: He is alert and oriented to person, place, and time.  Psychiatric:        Behavior: Behavior normal.        Thought Content: Thought content normal.        Judgment: Judgment normal.    Back Exam   Tenderness  The patient is experiencing tenderness in the lumbar.  Range of Motion  Extension:  abnormal  Flexion:  abnormal  Lateral bend right:  normal  Lateral bend left:  normal  Rotation right:  normal  Rotation left:  normal   Muscle Strength  Right Quadriceps:  5/5  Left  Quadriceps:  5/5  Right Hamstrings:  5/5  Left Hamstrings:  5/5   Tests  Straight leg raise right: negative Straight leg raise left: negative  Reflexes  Patellar:  2/4 Achilles:  2/4     Specialty Comments:  No specialty comments available.  Imaging: No results found.   PMFS History: Patient Active Problem List   Diagnosis Date Noted   Chronic venous insufficiency 10/01/2021   Anxiety 06/05/2021   Bedbug bite 05/13/2021   Localized edema 05/13/2021   PSVT (paroxysmal supraventricular tachycardia) (HCC) 02/19/2020   NSVT (nonsustained ventricular tachycardia) (HCC) 02/19/2020   Obesity (BMI 30-39.9) 02/19/2020   Shortness of breath 02/06/2020   Unstable angina (HCC) 02/06/2020   Mental health problem    Hypertension    High cholesterol    Hay fever    Gout    Arthritis    Type 2 diabetes mellitus without complication, without long-term current use of insulin (HCC)    Near syncope 12/27/2019   Pre-syncope 12/27/2019   Acute on chronic congestive heart failure (HCC)    Syncope 12/26/2019   Past Medical History:  Diagnosis Date   Acute on chronic congestive heart failure (HCC)    Arthritis    Gout    Hay fever    High cholesterol    Hypertension    Mental health problem    Near syncope 12/27/2019   Pre-syncope 12/27/2019   Shortness of breath 02/06/2020   Syncope 12/26/2019   Type 2 diabetes mellitus without complication, without long-term current use of insulin (HCC)    Unstable angina (HCC) 02/06/2020    Family History  Problem Relation Age of Onset   Diabetes Mother    Hypertension Father    Hypertension Brother    Hypertension Maternal Grandfather    Hypertension Paternal Grandmother     Past Surgical History:  Procedure Laterality Date   EYE SURGERY     HEMORROIDECTOMY     RIGHT/LEFT HEART CATH AND CORONARY ANGIOGRAPHY N/A 02/06/2020   Procedure: RIGHT/LEFT HEART CATH AND CORONARY ANGIOGRAPHY;  Surgeon: Marykay Lex, MD;  Location: [facility]  INVASIVE CV LAB;  Service: Cardiovascular;  Laterality: N/A;   WISDOM TOOTH EXTRACTION     Social History   Occupational History   Not on file  Tobacco Use   Smoking status: Former    Types: Cigarettes    Quit date: 04/20/2000    Years since quitting: 21.5   Smokeless tobacco: Never  Vaping Use   Vaping Use: Never used  Substance and Sexual Activity   Alcohol use: Never   Drug use: Never   Sexual activity: Not Currently

## 2021-11-14 NOTE — Patient Instructions (Addendum)
Has diabetes that is out of control with HgBA1c over 8 and last check. When HgBA1c is below 8 then surgery may be considered to treat his condition. I had recommended considering hemilaminectomy for lateral recess stenosis and I am  Reluctant to do fusion surgery as I believe that it would likely lead to increasingly more surgical  Fusions due to accelerated DDD.  Avoid frequent bending and stooping  No lifting greater than 10 lbs. May use ice or moist heat for pain. Weight loss is of benefit. Best medication for lumbar disc disease is arthritis medications like motrin, celebrex and naprosyn. Exercise is important to improve your indurance and does allow people to function better inspite of back pain.

## 2021-11-21 ENCOUNTER — Other Ambulatory Visit: Payer: Self-pay

## 2021-11-21 ENCOUNTER — Encounter (HOSPITAL_COMMUNITY): Payer: Self-pay

## 2021-11-21 ENCOUNTER — Emergency Department (HOSPITAL_COMMUNITY): Payer: No Typology Code available for payment source

## 2021-11-21 ENCOUNTER — Emergency Department (HOSPITAL_COMMUNITY)
Admission: EM | Admit: 2021-11-21 | Discharge: 2021-11-21 | Disposition: A | Discharge status: Home / Self Care | Payer: No Typology Code available for payment source | Attending: Emergency Medicine | Admitting: Emergency Medicine

## 2021-11-21 DIAGNOSIS — Z794 Long term (current) use of insulin: Secondary | ICD-10-CM | POA: Diagnosis not present

## 2021-11-21 DIAGNOSIS — Y92410 Unspecified street and highway as the place of occurrence of the external cause: Secondary | ICD-10-CM | POA: Diagnosis not present

## 2021-11-21 DIAGNOSIS — M545 Low back pain, unspecified: Secondary | ICD-10-CM | POA: Insufficient documentation

## 2021-11-21 DIAGNOSIS — I10 Essential (primary) hypertension: Secondary | ICD-10-CM | POA: Insufficient documentation

## 2021-11-21 DIAGNOSIS — M5432 Sciatica, left side: Secondary | ICD-10-CM

## 2021-11-21 DIAGNOSIS — Z7984 Long term (current) use of oral hypoglycemic drugs: Secondary | ICD-10-CM | POA: Diagnosis not present

## 2021-11-21 DIAGNOSIS — Z79899 Other long term (current) drug therapy: Secondary | ICD-10-CM | POA: Insufficient documentation

## 2021-11-21 DIAGNOSIS — E119 Type 2 diabetes mellitus without complications: Secondary | ICD-10-CM | POA: Diagnosis not present

## 2021-11-21 DIAGNOSIS — M546 Pain in thoracic spine: Secondary | ICD-10-CM | POA: Insufficient documentation

## 2021-11-21 MED ORDER — HYDROCODONE-ACETAMINOPHEN 5-325 MG PO TABS
1.0000 | ORAL_TABLET | Freq: Once | ORAL | Status: AC
  Administered 2021-11-21: 1 via ORAL
  Filled 2021-11-21: qty 1

## 2021-11-21 MED ORDER — METHOCARBAMOL 500 MG PO TABS
1000.0000 mg | ORAL_TABLET | Freq: Once | ORAL | Status: AC
  Administered 2021-11-21: 1000 mg via ORAL
  Filled 2021-11-21: qty 2

## 2021-11-21 MED ORDER — LIDOCAINE 5 % EX PTCH: 1.0000 | MEDICATED_PATCH | CUTANEOUS | 0 refills | Status: DC

## 2021-11-21 MED ORDER — METHOCARBAMOL 500 MG PO TABS: 500.0000 mg | ORAL_TABLET | Freq: Two times a day (BID) | ORAL | 3 refills | Status: DC

## 2021-11-21 NOTE — Discharge Instructions (Signed)
Your CT scans look similar to those prior.  I have sent the following medications to your pharmacy  Robaxin.  This is a muscle relaxant.  It may make you tired.  Do not drive with this but you may take it at nighttime or in the morning if you do not have anything to do during the day Lidocaine patches.  You may wear up to 3 of these at a time but you cannot wear them for more than 12 hours at a time You may continue to use your hydrocodone  Follow-up with your orthopedic doctor as needed and return with any worsening symptoms.  It was a pleasure to meet you and I hope that you feel better.
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
## 2021-12-11 ENCOUNTER — Ambulatory Visit: Payer: Medicaid Other | Admitting: Pharmacist

## 2021-12-25 ENCOUNTER — Ambulatory Visit: Payer: Medicaid Other | Admitting: Specialist

## 2022-01-01 ENCOUNTER — Encounter: Payer: Medicaid Other | Attending: Internal Medicine | Admitting: Dietician

## 2022-01-01 ENCOUNTER — Encounter: Payer: Self-pay | Admitting: Internal Medicine

## 2022-01-01 ENCOUNTER — Encounter: Payer: Self-pay | Admitting: Dietician

## 2022-01-01 ENCOUNTER — Ambulatory Visit (INDEPENDENT_AMBULATORY_CARE_PROVIDER_SITE_OTHER): Payer: Medicaid Other | Admitting: Internal Medicine

## 2022-01-01 VITALS — BP 120/82 | HR 68 | Ht 71.0 in | Wt 266.8 lb

## 2022-01-01 DIAGNOSIS — E1165 Type 2 diabetes mellitus with hyperglycemia: Secondary | ICD-10-CM

## 2022-01-01 DIAGNOSIS — E1159 Type 2 diabetes mellitus with other circulatory complications: Secondary | ICD-10-CM | POA: Diagnosis not present

## 2022-01-01 DIAGNOSIS — E785 Hyperlipidemia, unspecified: Secondary | ICD-10-CM

## 2022-01-01 DIAGNOSIS — E119 Type 2 diabetes mellitus without complications: Secondary | ICD-10-CM

## 2022-01-01 LAB — POCT GLYCOSYLATED HEMOGLOBIN (HGB A1C): Hemoglobin A1C: 8.8 % — AB (ref 4.0–5.6)

## 2022-01-01 MED ORDER — RELION PEN NEEDLES 32G X 4 MM MISC: 3 refills | Status: DC

## 2022-01-01 NOTE — Patient Instructions (Addendum)
Great job on taking your insulin and other medications! Begin checking your blood glucose more consistently. Exercise as you are able             Physical therapy exercises             Armchair exercises (you tube)             Walking Take the skin off the chicken Aim for consistent meals about every 5 hours. Consider eating an earlier dinner Eat a protein with each meal (egg, cheese, chicken, nuts, peanut butter) Half your plate should be non starchy vegetables.   More plants and less meat.  Drink water and other unsweetened beverages.  Juice, regular soda and other sweetened drinks will cause your blood glucose to be high.
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
## 2022-01-01 NOTE — Patient Instructions (Addendum)
PLEASE STOP ANY SWEET DRINK!!!  STOP MILK!! (You can have almond milk).  Please stop Bromocriptine.  Please continue: - Metformin ER 1000 mg 2x a day, with meals - Glipizide XL 5 mg before breakfast - Jardiance 25 mg before breakfast - Trulicity 4.5 mg weekly - Lantus 15 units in am  Start rotating the sugar checks throughout the day.  Send me the blood sugars if they are not improving.  Please return in 3-4 months.  PATIENT INSTRUCTIONS FOR TYPE 2 DIABETES:  DIET AND EXERCISE Diet and exercise is an important part of diabetic treatment.  We recommended aerobic exercise in the form of brisk walking (working between 40-60% of maximal aerobic capacity, similar to brisk walking) for 150 minutes per week (such as 30 minutes five days per week) along with 3 times per week performing 'resistance' training (using various gauge rubber tubes with handles) 5-10 exercises involving the major muscle groups (upper body, lower body and core) performing 10-15 repetitions (or near fatigue) each exercise. Start at half the above goal but build slowly to reach the above goals. If limited by weight, joint pain, or disability, we recommend daily walking in a swimming pool with water up to waist to reduce pressure from joints while allow for adequate exercise.    BLOOD GLUCOSES Monitoring your blood glucoses is important for continued management of your diabetes. Please check your blood glucoses 2-4 times a day: fasting, before meals and at bedtime (you can rotate these measurements - e.g. one day check before the 3 meals, the next day check before 2 of the meals and before bedtime, etc.).   HYPOGLYCEMIA (low blood sugar) Hypoglycemia is usually a reaction to not eating, exercising, or taking too much insulin/ other diabetes drugs.  Symptoms include tremors, sweating, hunger, confusion, headache, etc. Treat IMMEDIATELY with 15 grams of Carbs: 4 glucose tablets  cup regular juice/soda 2 tablespoons  raisins 4 teaspoons sugar 1 tablespoon honey Recheck blood glucose in 15 mins and repeat above if still symptomatic/blood glucose <100.  RECOMMENDATIONS TO REDUCE YOUR RISK OF DIABETIC COMPLICATIONS: * Take your prescribed MEDICATION(S) * Follow a DIABETIC diet: Complex carbs, fiber rich foods, (monounsaturated and polyunsaturated) fats * AVOID saturated/trans fats, high fat foods, >2,300 mg salt per day. * EXERCISE at least 5 times a week for 30 minutes or preferably daily.  * DO NOT SMOKE OR DRINK more than 1 drink a day. * Check your FEET every day. Do not wear tightfitting shoes. Contact us if you develop an ulcer * See your EYE doctor once a year or more if needed * Get a FLU shot once a year * Get a PNEUMONIA vaccine once before and once after age 69 years  GOALS:  * Your Hemoglobin A1c of <7%  * fasting sugars need to be <130 * after meals sugars need to be <180 (2h after you start eating) * Your Systolic BP should be 140 or lower  * Your Diastolic BP should be 80 or lower  * Your HDL (Good Cholesterol) should be 40 or higher  * Your LDL (Bad Cholesterol) should be 100 or lower. * Your Triglycerides should be 150 or lower  * Your Urine microalbumin (kidney function) should be <30 * Your Body Mass Index should be 25 or lower    Please consider the following ways to cut down carbs and fat and increase fiber and micronutrients in your diet: - substitute whole grain for white bread or pasta - substitute brown rice for  white rice - substitute 90-calorie flat bread pieces for slices of bread when possible - substitute sweet potatoes or yams for white potatoes - substitute humus for margarine - substitute tofu for cheese when possible - substitute almond or rice milk for regular milk (would not drink soy milk daily due to concern for soy estrogen influence on breast cancer risk) - substitute dark chocolate for other sweets when possible - substitute water - can add lemon or  orange slices for taste - for diet sodas (artificial sweeteners will trick your body that you can eat sweets without getting calories and will lead you to overeating and weight gain in the long run) - do not skip breakfast or other meals (this will slow down the metabolism and will result in more weight gain over time)  - can try smoothies made from fruit and almond/rice milk in am instead of regular breakfast - can also try old-fashioned (not instant) oatmeal made with almond/rice milk in am - order the dressing on the side when eating salad at a restaurant (pour less than half of the dressing on the salad) - eat as little meat as possible - can try juicing, but should not forget that juicing will get rid of the fiber, so would alternate with eating raw veg./fruits or drinking smoothies - use as little oil as possible, even when using olive oil - can dress a salad with a mix of balsamic vinegar and lemon juice, for e.g. - use agave nectar, stevia sugar, or regular sugar rather than artificial sweateners - steam or broil/roast veggies  - snack on veggies/fruit/nuts (unsalted, preferably) when possible, rather than processed foods - reduce or eliminate aspartame in diet (it is in diet sodas, chewing gum, etc) Read the labels!  Try to read Dr. Katherina Right book: "Program for Reversing Diabetes" for other ideas for healthy eating.

## 2022-01-01 NOTE — Progress Notes (Signed)
Patient ID: Victor [name], male   DOB: May 16, 1970, 51 y.o.   MRN: [id number]  HPI: Victor [name] is a 51 y.o.-year-old male, returning for follow-up for DM2, dx initially as prediabetes in 2012, then as DM in 12/2019, insulin-dependent since 09/2021, uncontrolled, with complications (CHF, PN). Pt. previously saw Dr. Loanne Drilling, last visit 07/2021.  He had back pain and needs surgery  - HbA1c needs to be <8%.  Reviewed HbA1c: Lab Results  Component Value Date   HGBA1C 8.9 (A) 07/23/2021   HGBA1C 8.6 (A) 05/16/2021   HGBA1C 8.0 (A) 07/09/2020   HGBA1C 8.9 (A) 04/30/2020   HGBA1C 8.2 (A) 02/29/2020   HGBA1C 10.1 (H) 12/27/2019   Pt is on a regimen of: - Metformin ER 1000 >> 500 mg 2x a day, with meals (decreased 2/2 upset stomach) - Glipizide XL 5 mg before breakfast - Bromocriptine 2.5 mg daily - Jardiance 25 mg before breakfast - Trulicity 4.5 mg weekly - Lantus 15 units in am - added 07/2021  Pt checks his sugars 1x a day and they are: - am: 129-170 - 2h after b'fast: n/c - before lunch: n/c - 2h after lunch: n/c - before dinner: n/c - 2h after dinner: n/c - bedtime: n/c - nighttime: n/c Lowest sugar was 129; he has hypoglycemia awareness at 70.  Highest sugar was 180.  Glucometer: Accuchek guide  Pt's meals are: per nutritionist note >> now:  Dietary Intake    Breakfast oatmeal   Lunch >> poultry + veggies (only if not eating b'fast)    Dinner salad with steak, ranch dressing     Beverage(s) water, diet soda, flavored water, occasional coffee with sweet and low and powdered creamer, but also regular soda, juice  He still drinks regular sodas - 2 L per week. He only eats beef/pork 1x a mo. He saw Antonieta Iba with nutrition in 09/2021.  - no CKD, last BUN/creatinine:  Lab Results  Component Value Date   BUN 14 07/01/2021   BUN 17 05/16/2021   CREATININE 0.96 07/01/2021   CREATININE 0.95 05/16/2021  He is on Cozaar 25 mg daily.  - last set of lipids: Lab Results   Component Value Date   CHOL 168 12/27/2019   HDL 32 (L) 12/27/2019   LDLCALC 99 12/27/2019   TRIG 185 (H) 12/27/2019   CHOLHDL 5.3 12/27/2019  He is on Crestor 10 mg daily.  - last eye exam was in 09/12/2021. No DR. + glaucoma and cataracts.  - + numbness and tingling in his feet.  Last foot exam 10/01/2021.  He was taken off Neurontin.  He also has a history of HTN, gout, osteoarthritis, sciatica.  He is disabled.  He requested a service dog due to decreasing eyesight, before losing eyesight completely.  ROS: + see HPI No increased urination, blurry vision, nausea, chest pain.  Past Medical History:  Diagnosis Date   Acute on chronic congestive heart failure (HCC)    Arthritis    Gout    Hay fever    High cholesterol    Hypertension    Mental health problem    Near syncope 12/27/2019   Pre-syncope 12/27/2019   Shortness of breath 02/06/2020   Syncope 12/26/2019   Type 2 diabetes mellitus without complication, without long-term current use of insulin (HCC)    Unstable angina ([place]) 02/06/2020   Past Surgical History:  Procedure Laterality Date   EYE SURGERY     HEMORROIDECTOMY     RIGHT/LEFT HEART CATH AND CORONARY  ANGIOGRAPHY N/A 02/06/2020   Procedure: RIGHT/LEFT HEART CATH AND CORONARY ANGIOGRAPHY;  Surgeon: Leonie Man, MD;  Location: [facility];  Service: Cardiovascular;  Laterality: N/A;   WISDOM TOOTH EXTRACTION     Social History   Socioeconomic History   Marital status: Divorced    Spouse name: Not on file   Number of children: Not on file   Years of education: Not on file   Highest education level: Not on file  Occupational History   Not on file  Tobacco Use   Smoking status: Former    Types: Cigarettes    Quit date: 04/20/2000    Years since quitting: 21.7   Smokeless tobacco: Never  Vaping Use   Vaping Use: Never used  Substance and Sexual Activity   Alcohol use: Never   Drug use: Never   Sexual activity: Not Currently  Other Topics  Concern   Not on file  Social History Narrative   Works 3rd shift    Social Determinants of Radio broadcast assistant Strain: Not on file  Food Insecurity: Not on file  Transportation Needs: Not on file  Physical Activity: Not on file  Stress: Not on file  Social Connections: Not on file  Intimate Partner Violence: Not on file   Current Outpatient Medications on File Prior to Visit  Medication Sig Dispense Refill   ACCU-CHEK GUIDE test strip 1 each by Other route 2 (two) times daily. as directed 100 each 2   Accu-Chek Softclix Lancets lancets 1 each by Other route in the morning and at bedtime. 100 each 2   Blood Glucose Monitoring Suppl (ACCU-CHEK GUIDE) w/Device KIT Use to check blood sugar twice daily. 1 kit 0   clotrimazole (CLOTRIMAZOLE AF) 1 % cream Apply 1 application  topically 2 (two) times daily. 60 g 1   empagliflozin (JARDIANCE) 25 MG TABS tablet Take 1 tablet (25 mg total) by mouth daily before breakfast. 90 tablet 3   furosemide (LASIX) 20 MG tablet Take 1 tablet (20 mg total) by mouth daily. Take with 40 mg for a total of 60 mg 90 tablet 1   furosemide (LASIX) 40 MG tablet Take 1 tablet (40 mg total) by mouth daily. Take with 20 mg for a total of 60 mg 30 tablet 3   glipiZIDE (GLUCOTROL XL) 5 MG 24 hr tablet Take 5 mg by mouth daily with breakfast.     HYDROcodone-acetaminophen (NORCO/VICODIN) 5-325 MG tablet Take 0.5-1 tablets by mouth 2 (two) times daily.     insulin glargine (LANTUS SOLOSTAR) 100 UNIT/ML Solostar Pen Inject 15 Units into the skin every morning. And pen needles 1/day 15 mL PRN   Insulin Pen Needle (RELION PEN NEEDLES) 32G X 4 MM MISC Inject 15 Units into the skin every morning 100 each 3   lidocaine (LIDODERM) 5 % Place 1 patch onto the skin daily. Remove & Discard patch within 12 hours or as directed by MD 30 patch 0   metFORMIN (GLUCOPHAGE-XR) 500 MG 24 hr tablet Take 2 tablets (1,000 mg total) by mouth daily. 180 tablet 3   methocarbamol (ROBAXIN)  500 MG tablet Take 1 tablet (500 mg total) by mouth 2 (two) times daily. 60 tablet 3   potassium chloride SA (KLOR-CON) 20 MEQ tablet Take 20 mEq by mouth daily.     rosuvastatin (CRESTOR) 10 MG tablet Take 1 tablet (10 mg total) by mouth daily. 90 tablet 1   sertraline (ZOLOFT) 50 MG tablet Take 50  mg by mouth daily.     triamcinolone cream (KENALOG) 0.1 % Apply 1 application topically 2 (two) times daily. 80 g 0   TRULICITY 4.5 IO/9.6EX SOPN Inject 4.5 mg into the skin once a week. 6 mL 3   No current facility-administered medications on file prior to visit.   Allergies  Allergen Reactions   Shellfish-Derived Products Hives   Coconut (Cocos Nucifera) Hives and Rash   Family History  Problem Relation Age of Onset   Diabetes Mother    Hypertension Father    Hypertension Brother    Hypertension Maternal Grandfather    Hypertension Paternal Grandmother    PE: BP 120/82 (BP Location: Right Arm, Patient Position: Sitting, Cuff Size: Normal)   Pulse 68   Ht '5\' 11"'  (1.803 m)   Wt 266 lb 12.8 oz (121 kg)   SpO2 97%   BMI 37.21 kg/m  Wt Readings from Last 3 Encounters:  01/01/22 266 lb 12.8 oz (121 kg)  11/21/21 250 lb (113.4 kg)  11/14/21 252 lb (114.3 kg)   Constitutional: overweight, in NAD Eyes:  EOMI, no exophthalmos ENT: no neck masses, no cervical lymphadenopathy Cardiovascular: RRR, No MRG Respiratory: CTA B Musculoskeletal: no deformities Skin:no rashes Neurological: no tremor with outstretched hands  ASSESSMENT: 1. DM2, insulin-dependent, uncontrolled, with complications - CHF - PN  2. HL  PLAN:  1. Patient with long-standing, uncontrolled diabetes, on oral antidiabetic regimen with metformin, sulfonylurea, dopamine agonist, SGLT2 inhibitor, and also weekly GLP-1 receptor agonist and daily long-acting insulin, added at last visit with Dr. Loanne Drilling, with still poor control.  Latest HbA1c was 8.9%, increased from 8.6%.  Basal insulin was added since last visit.  At  this visit, HbA1c is 8.8% (not significantly improved). - at this visit, we reviewed his blood sugars at home.  He is only checking sugars in the morning and they are usually above target.  We discussed about the importance of checking blood sugars least once a day, but at throughout the day, rotating check times.  I gave him a blood sugar log and advised him how to fill it out.  I advised him to send the blood sugars to me if they do not improve after today's visit. -He is working with a nutritionist.  However, upon questioning, he is still drinking regular sodas, juice, and milk.  We discussed that these are off limits patients with diabetes and I strongly advised him to stop all of them.  I am expecting that his  HbA1c will greatly improve after this measure so for now, I advised him to continue the current regimen, with the exception of bromocriptine, which I do not feel helps, so we will stop it today. -He is disappointed about his HbA1c today especially as he cannot have back surgery for now. - I suggested to:  Patient Instructions  PLEASE STOP ANY SWEET [place]!!!  STOP MILK!! (You can have almond milk).  Please stop Bromocriptine.  Please continue: - Metformin ER 1000 mg 2x a day, with meals - Glipizide XL 5 mg before breakfast - Jardiance 25 mg before breakfast - Trulicity 4.5 mg weekly - Lantus 15 units in am  Start rotating the sugar checks throughout the day.  Send me the blood sugars if they are not improving.  Please return in 3-4 months.  - discussed about CBG targets for treatment: 80-130 mg/dL before meals and <180 mg/dL after meals; target HbA1c <7%. - given foot care handout  - given instructions for hypoglycemia management "  15-15 rule"  - advised for yearly eye exams  - he is UTD - Return to clinic in 3-4 mo with sugar log   2. HL - Reviewed latest lipid panel from 2021: LDL above target of less than 55 due to cardiovascular disease, triglycerides high, HDL low: Lab  Results  Component Value Date   CHOL 168 12/27/2019   HDL 32 (L) 12/27/2019   LDLCALC 99 12/27/2019   TRIG 185 (H) 12/27/2019   CHOLHDL 5.3 12/27/2019  - Continues Crestor 10 mg daily without side effects. - we we will check this at next visit if not checked by PCP, after his diabetes improves more and he stops the sweet drinks.  - Total time spent for the visit: 40 min, in precharting,  reviewing Dr. Cordelia Pen last note, obtaining medical information from the chart and from the pt, reviewing his  previous labs, evaluations, nutritionist notes, and treatments, reviewing his symptoms, counseling him about his diabetes and about improving diet (please see the discussed topics above), and developing a plan to further treat it; he had a number of questions which I addressed.  Philemon Kingdom, MD PhD [facility] Endocrinology

## 2022-04-07 ENCOUNTER — Ambulatory Visit: Payer: Medicaid Other | Admitting: Internal Medicine

## 2022-04-07 ENCOUNTER — Ambulatory Visit: Payer: Medicaid Other | Admitting: Family Medicine

## 2022-04-07 NOTE — Progress Notes (Deleted)
Patient ID: Victor [name], male   DOB: 1970-07-23, 51 y.o.   MRN: [id number]  HPI: Victor [name] is a 51 y.o.-year-old male, returning for follow-up for DM2, dx initially as prediabetes in 2012, then as DM in 12/2019, insulin-dependent since 09/2021, uncontrolled, with complications (CHF, PN). Pt. previously saw Dr. Loanne Drilling, but last visit with me 3 months ago.  Interim history: No increased urination, blurry vision, nausea, chest pain. He had back pain and needs surgery  - HbA1c needs to be <8%.  Reviewed HbA1c: Lab Results  Component Value Date   HGBA1C 8.8 (A) 01/01/2022   HGBA1C 8.9 (A) 07/23/2021   HGBA1C 8.6 (A) 05/16/2021   HGBA1C 8.0 (A) 07/09/2020   HGBA1C 8.9 (A) 04/30/2020   HGBA1C 8.2 (A) 02/29/2020   HGBA1C 10.1 (H) 12/27/2019   Pt is on a regimen of: - Metformin ER 1000 >> 500 mg 2x a day, with meals (decreased 2/2 upset stomach) - Glipizide XL 5 mg before breakfast - Bromocriptine 2.5 mg daily >> stopped 12/2021 - Jardiance 25 mg before breakfast - Trulicity 4.5 mg weekly - Lantus 15 units in am - added 07/2021  Pt checks his sugars 1x a day and they are: - am: 129-170 - 2h after b'fast: n/c - before lunch: n/c - 2h after lunch: n/c - before dinner: n/c - 2h after dinner: n/c - bedtime: n/c - nighttime: n/c Lowest sugar was 129; he has hypoglycemia awareness at 70.  Highest sugar was 180.  Glucometer: Accuchek guide  She is on nutrition 09/2021. He only eats beef/pork 1x a mo.  - no CKD, last BUN/creatinine:  Lab Results  Component Value Date   BUN 14 07/01/2021   BUN 17 05/16/2021   CREATININE 0.96 07/01/2021   CREATININE 0.95 05/16/2021  He is on Cozaar 25 mg daily.  -+ HL; last set of lipids: Lab Results  Component Value Date   CHOL 168 12/27/2019   HDL 32 (L) 12/27/2019   LDLCALC 99 12/27/2019   TRIG 185 (H) 12/27/2019   CHOLHDL 5.3 12/27/2019  He is on Crestor 10 mg daily.  - last eye exam was in 09/12/2021. No DR. + glaucoma and  cataracts.  - + numbness and tingling in his feet.  Last foot exam 10/01/2021.  He was taken off Neurontin.  He also has a history of HTN, gout, osteoarthritis, sciatica.  He is disabled.  He requested a service dog due to decreasing eyesight, before losing eyesight completely.  ROS: + see HPI  Past Medical History:  Diagnosis Date   Acute on chronic congestive heart failure (HCC)    Arthritis    Gout    Hay fever    High cholesterol    Hypertension    Mental health problem    Near syncope 12/27/2019   Pre-syncope 12/27/2019   Shortness of breath 02/06/2020   Syncope 12/26/2019   Type 2 diabetes mellitus without complication, without long-term current use of insulin (HCC)    Unstable angina ([place]) 02/06/2020   Past Surgical History:  Procedure Laterality Date   EYE SURGERY     HEMORROIDECTOMY     RIGHT/LEFT HEART CATH AND CORONARY ANGIOGRAPHY N/A 02/06/2020   Procedure: RIGHT/LEFT HEART CATH AND CORONARY ANGIOGRAPHY;  Surgeon: Leonie Man, MD;  Location: [facility];  Service: Cardiovascular;  Laterality: N/A;   WISDOM TOOTH EXTRACTION     Social History   Socioeconomic History   Marital status: Divorced    Spouse name: Not on file  Number of children: Not on file   Years of education: Not on file   Highest education level: Not on file  Occupational History   Not on file  Tobacco Use   Smoking status: Former    Types: Cigarettes    Quit date: 04/20/2000    Years since quitting: 21.9   Smokeless tobacco: Never  Vaping Use   Vaping Use: Never used  Substance and Sexual Activity   Alcohol use: Never   Drug use: Never   Sexual activity: Not Currently  Other Topics Concern   Not on file  Social History Narrative   Works 3rd shift    Social Determinants of Radio broadcast assistant Strain: Not on file  Food Insecurity: Not on file  Transportation Needs: Not on file  Physical Activity: Not on file  Stress: Not on file  Social Connections: Not on file   Intimate Partner Violence: Not on file   Current Outpatient Medications on File Prior to Visit  Medication Sig Dispense Refill   ACCU-CHEK GUIDE test strip 1 each by Other route 2 (two) times daily. as directed 100 each 2   Accu-Chek Softclix Lancets lancets 1 each by Other route in the morning and at bedtime. 100 each 2   Blood Glucose Monitoring Suppl (ACCU-CHEK GUIDE) w/Device KIT Use to check blood sugar twice daily. 1 kit 0   empagliflozin (JARDIANCE) 25 MG TABS tablet Take 1 tablet (25 mg total) by mouth daily before breakfast. 90 tablet 3   furosemide (LASIX) 20 MG tablet Take 1 tablet (20 mg total) by mouth daily. Take with 40 mg for a total of 60 mg 90 tablet 1   furosemide (LASIX) 40 MG tablet Take 1 tablet (40 mg total) by mouth daily. Take with 20 mg for a total of 60 mg 30 tablet 3   glipiZIDE (GLUCOTROL XL) 5 MG 24 hr tablet Take 5 mg by mouth daily with breakfast.     HYDROcodone-acetaminophen (NORCO/VICODIN) 5-325 MG tablet Take 0.5-1 tablets by mouth 2 (two) times daily.     insulin glargine (LANTUS SOLOSTAR) 100 UNIT/ML Solostar Pen Inject 15 Units into the skin every morning. And pen needles 1/day 15 mL PRN   Insulin Pen Needle (RELION PEN NEEDLES) 32G X 4 MM MISC Inject 1x a day 100 each 3   metFORMIN (GLUCOPHAGE-XR) 500 MG 24 hr tablet Take 2 tablets (1,000 mg total) by mouth daily. 180 tablet 3   methocarbamol (ROBAXIN) 500 MG tablet Take 1 tablet (500 mg total) by mouth 2 (two) times daily. 60 tablet 3   potassium chloride SA (KLOR-CON) 20 MEQ tablet Take 20 mEq by mouth daily.     rosuvastatin (CRESTOR) 10 MG tablet Take 1 tablet (10 mg total) by mouth daily. 90 tablet 1   sertraline (ZOLOFT) 50 MG tablet Take 50 mg by mouth daily.     triamcinolone cream (KENALOG) 0.1 % Apply 1 application topically 2 (two) times daily. 80 g 0   TRULICITY 4.5 JJ/9.4RD SOPN Inject 4.5 mg into the skin once a week. 6 mL 3   No current facility-administered medications on file prior to  visit.   Allergies  Allergen Reactions   Shellfish-Derived Products Hives   Coconut (Cocos Nucifera) Hives and Rash   Family History  Problem Relation Age of Onset   Diabetes Mother    Hypertension Father    Hypertension Brother    Hypertension Maternal Grandfather    Hypertension Paternal Grandmother    PE:  There were no vitals taken for this visit. Wt Readings from Last 3 Encounters:  01/01/22 266 lb 12.8 oz (121 kg)  01/01/22 266 lb (120.7 kg)  11/21/21 250 lb (113.4 kg)   Constitutional: overweight, in NAD Eyes:  EOMI, no exophthalmos ENT: no neck masses, no cervical lymphadenopathy Cardiovascular: RRR, No MRG Respiratory: CTA B Musculoskeletal: no deformities Skin:no rashes Neurological: no tremor with outstretched hands  ASSESSMENT: 1. DM2, insulin-dependent, uncontrolled, with complications - CHF - PN  2. HL  PLAN:  1. Patient with longstanding, uncontrolled, type 2 diabetes, on oral antidiabetic regimen with metformin, sulfonylurea, SGLT2 inhibitor and also weekly GLP-1 receptor agonist and daily long-acting insulin, with still poor control: HbA1c was 8.8% at last visit.  At that time, we stopped his bromocriptine and also discussed at length about stopping sweet drinks and meal and rotate blood sugar checks.  I did advise him to send me the CBGs if not better.  He did not contact me since last visit.  We discussed that for back surgery, he had to have a better HbA1c.  - I suggested to:  Patient Instructions  PLEASE STOP ANY SWEET [place]!!!  STOP MILK!! (You can have almond milk).  Please continue: - Metformin ER 1000 mg 2x a day, with meals - Glipizide XL 5 mg before breakfast - Jardiance 25 mg before breakfast - Trulicity 4.5 mg weekly - Lantus 15 units in am  Please return in 3-4 months.  - we checked his HbA1c: 7%  - advised to check sugars at different times of the day - 4x a day, rotating check times - advised for yearly eye exams >> he is UTD -  return to clinic in 3-4 months  2. HL -Reviewed the latest lipid panel from 2021: LDL above target of less than 55 due to CVD, triglycerides high, HDL low: Lab Results  Component Value Date   CHOL 168 12/27/2019   HDL 32 (L) 12/27/2019   LDLCALC 99 12/27/2019   TRIG 185 (H) 12/27/2019   CHOLHDL 5.3 12/27/2019  -Continues Crestor 10 mg daily without side effects -She is due for another lipid panel  Philemon Kingdom, MD PhD [facility] Endocrinology

## 2022-04-29 DIAGNOSIS — M7989 Other specified soft tissue disorders: Secondary | ICD-10-CM | POA: Insufficient documentation

## 2022-04-30 ENCOUNTER — Ambulatory Visit: Payer: Medicaid Other | Admitting: Dietician

## 2022-06-29 ENCOUNTER — Telehealth: Payer: Self-pay | Admitting: *Deleted

## 2022-06-29 NOTE — Telephone Encounter (Signed)
Pt states his pain doctor ordered his Cologuard test "a few months ago and I need to call him and ask him to get the results to Victor [name]". Pt informed that Victor [name] had ordered a GI referral in 3/23, and states "I know but my blood sugar was so out of control that we had to cancel that plan." Pt requests Victor [name] office fax number be mailed to him so that he could call pain dr and ask their office to fax his Cologuard results to Victor [name] directly. Pt states "I know the results were OK but I will have them fax the results to her; just mail me the fax number." - Letter with [facility] fax number mailed to pt today and in-basket message sent to clinic office pool notifying them to look for incoming fax r/t pt's Cologuard results.

## 2022-07-21 ENCOUNTER — Encounter (HOSPITAL_COMMUNITY): Payer: Self-pay

## 2022-07-21 ENCOUNTER — Emergency Department (HOSPITAL_COMMUNITY)
Admission: EM | Admit: 2022-07-21 | Discharge: 2022-07-21 | Disposition: A | Discharge status: Home / Self Care | Payer: Medicaid Other | Attending: Emergency Medicine | Admitting: Emergency Medicine

## 2022-07-21 ENCOUNTER — Other Ambulatory Visit: Payer: Self-pay

## 2022-07-21 ENCOUNTER — Emergency Department (HOSPITAL_BASED_OUTPATIENT_CLINIC_OR_DEPARTMENT_OTHER): Admit: 2022-07-21 | Discharge: 2022-07-21 | Disposition: A | Discharge status: Home / Self Care | Payer: Medicaid Other

## 2022-07-21 ENCOUNTER — Emergency Department (HOSPITAL_COMMUNITY): Payer: Medicaid Other

## 2022-07-21 DIAGNOSIS — Z7984 Long term (current) use of oral hypoglycemic drugs: Secondary | ICD-10-CM | POA: Diagnosis not present

## 2022-07-21 DIAGNOSIS — Z79899 Other long term (current) drug therapy: Secondary | ICD-10-CM | POA: Insufficient documentation

## 2022-07-21 DIAGNOSIS — I5032 Chronic diastolic (congestive) heart failure: Secondary | ICD-10-CM | POA: Insufficient documentation

## 2022-07-21 DIAGNOSIS — R600 Localized edema: Secondary | ICD-10-CM | POA: Insufficient documentation

## 2022-07-21 DIAGNOSIS — E119 Type 2 diabetes mellitus without complications: Secondary | ICD-10-CM | POA: Diagnosis not present

## 2022-07-21 DIAGNOSIS — I110 Hypertensive heart disease with heart failure: Secondary | ICD-10-CM | POA: Diagnosis not present

## 2022-07-21 DIAGNOSIS — R609 Edema, unspecified: Secondary | ICD-10-CM | POA: Diagnosis not present

## 2022-07-21 DIAGNOSIS — Z794 Long term (current) use of insulin: Secondary | ICD-10-CM | POA: Diagnosis not present

## 2022-07-21 LAB — CBG MONITORING, ED: Glucose-Capillary: 189 mg/dL — ABNORMAL HIGH (ref 70–99)

## 2022-07-21 LAB — BASIC METABOLIC PANEL
Anion gap: 8 (ref 5–15)
Anion gap: 9 (ref 5–15)
BUN: 14 mg/dL (ref 6–20)
BUN: 11 mg/dL (ref 6–20)
CO2: 23 mmol/L (ref 22–32)
CO2: 24 mmol/L (ref 22–32)
Calcium: 8.4 mg/dL — ABNORMAL LOW (ref 8.9–10.3)
Calcium: 9 mg/dL (ref 8.9–10.3)
Chloride: 106 mmol/L (ref 98–111)
Chloride: 104 mmol/L (ref 98–111)
Creatinine, Ser: 0.95 mg/dL (ref 0.61–1.24)
Creatinine, Ser: 0.94 mg/dL (ref 0.61–1.24)
GFR, Estimated: >60 mL/min (ref >60)
GFR, Estimated: >60 mL/min (ref >60)
Glucose, Bld: 168 mg/dL — ABNORMAL HIGH (ref 70–99)
Glucose, Bld: 150 mg/dL — ABNORMAL HIGH (ref 70–99)
Potassium: 5.6 mmol/L — ABNORMAL HIGH (ref 3.5–5.1)
Potassium: 3.3 mmol/L — ABNORMAL LOW (ref 3.5–5.1)
Sodium: 137 mmol/L (ref 135–145)
Sodium: 137 mmol/L (ref 135–145)

## 2022-07-21 LAB — BRAIN NATRIURETIC PEPTIDE: B Natriuretic Peptide: 7.3 pg/mL (ref 0.0–100.0)

## 2022-07-21 LAB — CBC WITH DIFFERENTIAL/PLATELET
Abs Immature Granulocytes: 0 10*3/uL (ref 0.00–0.07)
Basophils Absolute: 0 10*3/uL (ref 0.0–0.1)
Basophils Relative: 1 %
Eosinophils Absolute: 0.1 10*3/uL (ref 0.0–0.5)
Eosinophils Relative: 2 %
HCT: 40.8 % (ref 39.0–52.0)
Hemoglobin: 12.9 g/dL — ABNORMAL LOW (ref 13.0–17.0)
Immature Granulocytes: 0 %
Lymphocytes Relative: 33 %
Lymphs Abs: 1.1 10*3/uL (ref 0.7–4.0)
MCH: 26.5 pg (ref 26.0–34.0)
MCHC: 31.6 g/dL (ref 30.0–36.0)
MCV: 83.8 fL (ref 80.0–100.0)
Monocytes Absolute: 0.2 10*3/uL (ref 0.1–1.0)
Monocytes Relative: 7 %
Neutro Abs: 1.9 10*3/uL (ref 1.7–7.7)
Neutrophils Relative %: 57 %
Platelets: 126 10*3/uL — ABNORMAL LOW (ref 150–400)
RBC: 4.87 MIL/uL (ref 4.22–5.81)
RDW: 14.6 % (ref 11.5–15.5)
WBC: 3.2 10*3/uL — ABNORMAL LOW (ref 4.0–10.5)
nRBC: 0 % (ref 0.0–0.2)

## 2022-07-21 MED ORDER — POTASSIUM CHLORIDE CRYS ER 20 MEQ PO TBCR: 20.0000 meq | EXTENDED_RELEASE_TABLET | Freq: Two times a day (BID) | ORAL | 0 refills | Status: DC

## 2022-07-21 MED ORDER — POTASSIUM CHLORIDE CRYS ER 20 MEQ PO TBCR
40.0000 meq | EXTENDED_RELEASE_TABLET | Freq: Once | ORAL | Status: AC
  Administered 2022-07-21: 40 meq via ORAL
  Filled 2022-07-21: qty 2

## 2022-07-21 MED ORDER — FUROSEMIDE 10 MG/ML IJ SOLN
40.0000 mg | Freq: Once | INTRAMUSCULAR | Status: AC
  Administered 2022-07-21: 40 mg via INTRAVENOUS
  Filled 2022-07-21: qty 4

## 2022-07-21 NOTE — ED Provider Notes (Signed)
Accepted handoff at shift change from [facility]. Please see prior provider note for full HPI.  Briefly: Patient is a 52 y.o. male who presents to the ER for bilateral leg swelling. Concern for peripheral edema secondary to heart failure. Ultrasounds negative for DVT.   DDX/Plan: Follow up on labs and reevaluate patient after diuresis.   Physical Exam  BP 98/71   Pulse 65   Temp 97.8 F (36.6 C) (Oral)   Resp 13   Ht 5\' 9"  (1.753 m)   Wt 120.2 kg   SpO2 100%   BMI 39.13 kg/m   Physical Exam Vitals and nursing note reviewed.  Constitutional:      Appearance: Normal appearance.  HENT:     Head: Normocephalic and atraumatic.  Eyes:     Conjunctiva/sclera: Conjunctivae normal.  Cardiovascular:     Comments: Bilateral nonpitting edema to the legs, L >R Pulmonary:     Effort: Pulmonary effort is normal. No tachypnea, accessory muscle usage or respiratory distress.  Skin:    General: Skin is warm and dry.     Comments: No overlying skin changes to the legs, not cellulitic  Neurological:     Mental Status: He is alert.  Psychiatric:        Mood and Affect: Mood normal.        Behavior: Behavior normal.    Results   Results for orders placed or performed during the hospital encounter of 07/21/22  CBC with Differential  Result Value Ref Range   WBC 3.2 (L) 4.0 - 10.5 K/uL   RBC 4.87 4.22 - 5.81 MIL/uL   Hemoglobin 12.9 (L) 13.0 - 17.0 g/dL   HCT 40.8 39.0 - 52.0 %   MCV 83.8 80.0 - 100.0 fL   MCH 26.5 26.0 - 34.0 pg   MCHC 31.6 30.0 - 36.0 g/dL   RDW 14.6 11.5 - 15.5 %   Platelets 126 (L) 150 - 400 K/uL   nRBC 0.0 0.0 - 0.2 %   Neutrophils Relative % 57 %   Neutro Abs 1.9 1.7 - 7.7 K/uL   Lymphocytes Relative 33 %   Lymphs Abs 1.1 0.7 - 4.0 K/uL   Monocytes Relative 7 %   Monocytes Absolute 0.2 0.1 - 1.0 K/uL   Eosinophils Relative 2 %   Eosinophils Absolute 0.1 0.0 - 0.5 K/uL   Basophils Relative 1 %   Basophils Absolute 0.0 0.0 - 0.1 K/uL   Immature Granulocytes  0 %   Abs Immature Granulocytes 0.00 0.00 - 0.07 K/uL  Brain natriuretic peptide  Result Value Ref Range   B Natriuretic Peptide 7.3 0.0 - 100.0 pg/mL  Basic metabolic panel  Result Value Ref Range   Sodium 137 135 - 145 mmol/L   Potassium 3.3 (L) 3.5 - 5.1 mmol/L   Chloride 104 98 - 111 mmol/L   CO2 24 22 - 32 mmol/L   Glucose, Bld 150 (H) 70 - 99 mg/dL   BUN 11 6 - 20 mg/dL   Creatinine, Ser 0.94 0.61 - 1.24 mg/dL   Calcium 9.0 8.9 - 10.3 mg/dL   GFR, Estimated >60 >60 mL/min   Anion gap 9 5 - 15  POC CBG, ED  Result Value Ref Range   Glucose-Capillary 189 (H) 70 - 99 mg/dL    ED Course / MDM   Clinical Course as of 07/21/22 1519  Tue Jul 20, 7341  [id number] 52 year old male with history of peripheral edema with increased swelling on his legs after  he has been going down on his Lasix dose due to muscle cramps and other issues.  He denies any shortness of breath fevers cough.  He has significant edema both lower extremities although no gross signs of infection.  Getting some lab work will need some IV diuresis if there is no other acute findings. [MB]    Clinical Course User Index [MB] Hayden Rasmussen, MD   Medical Decision Making Amount and/or Complexity of Data Reviewed Labs: ordered. Radiology: ordered.  Risk Prescription drug management.  On my evaluation, patient is no acute distress. Normal respiratory effort. Normal oxygen saturations while ambulating. Bilateral leg edema noted. No overlying skin changes.   Patient diuresing well after 40 mg of IV Lasix.  No decompensation during 6-hour observation in the ER.  After review of the patient's workup, do not believe he is requiring admission for his symptoms today.  Suspect edema in the setting of recent decrease in his Lasix.  No evidence of acute heart failure exacerbation.  He is otherwise feeling well, and ready to go home. Will recommend taking home 20 mg lasix twice daily for the next 4 days, with potassium  replacement. Patient discharged in stable condition, and all questions answered.    Kateri Plummer, PA-C 07/21/22 1921    Kemper Durie, DO 07/21/22 2245

## 2022-07-21 NOTE — ED Provider Notes (Signed)
[place] Provider Note   CSN: [id number] Arrival date & time: 07/21/22  1256     History  Chief Complaint  Patient presents with   Leg Swelling    Victor [name] is a 52 y.o. male with a past medical history of CHF, hypertension, diabetes today for evaluation of leg swelling.  Patient states he has has increased swelling in his legs in the last 2 days.  States he has recently reduced his Lasix from 80 mg to 20 mg a week ago.  Denies any injury to his leg.  Denies any fever, chest pain.  Endorses mild shortness of breath.  HPI    Past Medical History:  Diagnosis Date   Acute on chronic congestive heart failure    Arthritis    Gout    Hay fever    High cholesterol    Hypertension    Mental health problem    Near syncope 12/27/2019   Pre-syncope 12/27/2019   Shortness of breath 02/06/2020   Syncope 12/26/2019   Type 2 diabetes mellitus without complication, without long-term current use of insulin    Unstable angina 02/06/2020   Past Surgical History:  Procedure Laterality Date   EYE SURGERY     HEMORROIDECTOMY     RIGHT/LEFT HEART CATH AND CORONARY ANGIOGRAPHY N/A 02/06/2020   Procedure: RIGHT/LEFT HEART CATH AND CORONARY ANGIOGRAPHY;  Surgeon: Leonie Man, MD;  Location: [facility];  Service: Cardiovascular;  Laterality: N/A;   WISDOM TOOTH EXTRACTION       Home Medications Prior to Admission medications   Medication Sig Start Date End Date Taking? Authorizing Provider  ACCU-CHEK GUIDE test strip 1 each by Other route 2 (two) times daily. as directed 11/07/21   Charlott Rakes, MD  Accu-Chek Softclix Lancets lancets 1 each by Other route in the morning and at bedtime. 11/07/21   Charlott Rakes, MD  Blood Glucose Monitoring Suppl (ACCU-CHEK GUIDE) w/Device KIT Use to check blood sugar twice daily. 11/07/21   Charlott Rakes, MD  empagliflozin (JARDIANCE) 25 MG TABS tablet Take 1 tablet (25 mg total) by mouth daily  before breakfast. 05/16/21   Renato Shin, MD  furosemide (LASIX) 20 MG tablet Take 1 tablet (20 mg total) by mouth daily. Take with 40 mg for a total of 60 mg 11/07/21   Charlott Rakes, MD  furosemide (LASIX) 40 MG tablet Take 1 tablet (40 mg total) by mouth daily. Take with 20 mg for a total of 60 mg 10/01/21   Charlott Rakes, MD  glipiZIDE (GLUCOTROL XL) 5 MG 24 hr tablet Take 5 mg by mouth daily with breakfast.    [provider]  HYDROcodone-acetaminophen (NORCO/VICODIN) 5-325 MG tablet Take 0.5-1 tablets by mouth 2 (two) times daily. 07/16/21   [provider]  insulin glargine (LANTUS SOLOSTAR) 100 UNIT/ML Solostar Pen Inject 15 Units into the skin every morning. And pen needles 1/day 07/23/21   Renato Shin, MD  Insulin Pen Needle (RELION PEN NEEDLES) 32G X 4 MM MISC Inject 1x a day 01/01/22   Philemon Kingdom, MD  metFORMIN (GLUCOPHAGE-XR) 500 MG 24 hr tablet Take 2 tablets (1,000 mg total) by mouth daily. 05/16/21   Renato Shin, MD  methocarbamol (ROBAXIN) 500 MG tablet Take 1 tablet (500 mg total) by mouth 2 (two) times daily. 11/21/21   Redwine, Madison A, PA-C  potassium chloride SA (KLOR-CON) 20 MEQ tablet Take 20 mEq by mouth daily.    [provider]  rosuvastatin (CRESTOR) 10 MG tablet Take 1 tablet (10 mg total) by mouth daily. 10/01/21   Charlott Rakes, MD  sertraline (ZOLOFT) 50 MG tablet Take 50 mg by mouth daily.    [provider]  triamcinolone cream (KENALOG) 0.1 % Apply 1 application topically 2 (two) times daily. 05/13/21   Mayers, Cari S, PA-C  TRULICITY 4.5 0000000 SOPN Inject 4.5 mg into the skin once a week. 09/22/21   Shamleffer, Melanie Crazier, MD      Allergies    Shellfish-derived products and Coconut (cocos nucifera)    Review of Systems   Review of Systems Negative except as per HPI.  Physical Exam Updated Vital Signs BP 104/60   Pulse (!) 57   Temp 97.8 F (36.6 C) (Oral)   Resp 20   Ht 5\' 9"  (1.753 m)   Wt 120.2 kg    SpO2 100%   BMI 39.13 kg/m  Physical Exam Vitals and nursing note reviewed.  Constitutional:      Appearance: Normal appearance.  HENT:     Head: Normocephalic and atraumatic.     Mouth/Throat:     Mouth: Mucous membranes are moist.  Eyes:     General: No scleral icterus. Cardiovascular:     Rate and Rhythm: Normal rate and regular rhythm.     Pulses: Normal pulses.     Heart sounds: Normal heart sounds.     Comments: Bilateral nonpitting edema L>R to lower extremities. Pulmonary:     Effort: Pulmonary effort is normal.     Breath sounds: Normal breath sounds.  Abdominal:     General: Abdomen is flat.     Palpations: Abdomen is soft.     Tenderness: There is no abdominal tenderness.  Musculoskeletal:        General: No deformity.  Skin:    General: Skin is warm.     Findings: No rash.  Neurological:     General: No focal deficit present.     Mental Status: He is alert.  Psychiatric:        Mood and Affect: Mood normal.     ED Results / Procedures / Treatments   Labs (all labs ordered are listed, but only abnormal results are displayed) Labs Reviewed  CBC WITH DIFFERENTIAL/PLATELET - Abnormal; Notable for the following components:      Result Value   WBC 3.2 (*)    Hemoglobin 12.9 (*)    Platelets 126 (*)    All other components within normal limits  CBG MONITORING, ED - Abnormal; Notable for the following components:   Glucose-Capillary 189 (*)    All other components within normal limits  BRAIN NATRIURETIC PEPTIDE  MAGNESIUM    EKG None  Radiology VAS [place] LOWER EXTREMITY VENOUS (DVT) (ONLY [facility] & [facility])  Result Date: 07/21/2022  Lower Venous DVT Study Patient Name:  Victor [name]  Date of Exam:   07/21/2022 Medical Rec #: [id number]    Accession #:    [id number] Date of Birth: 12/01/1970    Patient Gender: M Patient Age:   59 years Exam Location:  [facility] Procedure:      VAS [place] LOWER EXTREMITY VENOUS (DVT) Referring Phys: Shondrika Hoque  --------------------------------------------------------------------------------  Indications: Edema.  Risk Factors: None identified. Limitations: Body habitus and poor ultrasound/tissue interface. Comparison Study: No prior studies. Performing Technologist: Oliver Hum RVT  Examination Guidelines: A complete evaluation includes B-mode imaging, spectral Doppler, color Doppler, and power Doppler as needed of all accessible portions of  each vessel. Bilateral testing is considered an integral part of a complete examination. Limited examinations for reoccurring indications may be performed as noted. The reflux portion of the exam is performed with the patient in reverse Trendelenburg.  +---------+---------------+---------+-----------+----------+--------------+ RIGHT    CompressibilityPhasicitySpontaneityPropertiesThrombus Aging +---------+---------------+---------+-----------+----------+--------------+ CFV      Full           Yes      Yes                                 +---------+---------------+---------+-----------+----------+--------------+ SFJ      Full                                                        +---------+---------------+---------+-----------+----------+--------------+ FV Prox  Full                                                        +---------+---------------+---------+-----------+----------+--------------+ FV Mid   Full                                                        +---------+---------------+---------+-----------+----------+--------------+ FV DistalFull                                                        +---------+---------------+---------+-----------+----------+--------------+ PFV      Full                                                        +---------+---------------+---------+-----------+----------+--------------+ POP      Full           Yes      Yes                                  +---------+---------------+---------+-----------+----------+--------------+ PTV      Full                                                        +---------+---------------+---------+-----------+----------+--------------+ PERO     Full                                                        +---------+---------------+---------+-----------+----------+--------------+   +---------+---------------+---------+-----------+----------+--------------+ LEFT     CompressibilityPhasicitySpontaneityPropertiesThrombus  Aging +---------+---------------+---------+-----------+----------+--------------+ CFV      Full           Yes      Yes                                 +---------+---------------+---------+-----------+----------+--------------+ SFJ      Full                                                        +---------+---------------+---------+-----------+----------+--------------+ FV Prox  Full                                                        +---------+---------------+---------+-----------+----------+--------------+ FV Mid   Full                                                        +---------+---------------+---------+-----------+----------+--------------+ FV DistalFull                                                        +---------+---------------+---------+-----------+----------+--------------+ PFV      Full                                                        +---------+---------------+---------+-----------+----------+--------------+ POP      Full           Yes      Yes                                 +---------+---------------+---------+-----------+----------+--------------+ PTV      Full                                                        +---------+---------------+---------+-----------+----------+--------------+ PERO     Full                                                         +---------+---------------+---------+-----------+----------+--------------+    Summary: RIGHT: - There is no evidence of deep vein thrombosis in the lower extremity.  - No cystic structure found in the popliteal fossa.  LEFT: - There is no evidence of deep vein thrombosis in the lower extremity.  - No cystic structure found in the popliteal fossa.  *  See table(s) above for measurements and observations.    Preliminary    DG Chest 2 View  Result Date: 07/21/2022 CLINICAL DATA:  Shortness of breath. Bilateral lower extremity edema. EXAM: CHEST - 2 VIEW COMPARISON:  Chest radiograph 12/27/2019 FINDINGS: The cardiomediastinal silhouette is within normal limits. The lungs are hypoinflated. No airspace consolidation, overt pulmonary edema, pleural effusion, or pneumothorax is identified. No acute osseous abnormality is seen. IMPRESSION: No active cardiopulmonary disease. Electronically Signed   By: Logan Bores M.D.   On: 07/21/2022 14:16    Procedures Procedures    Medications Ordered in ED Medications  furosemide (LASIX) injection 40 mg (40 mg Intravenous Given 07/21/22 1523)    ED Course/ Medical Decision Making/ A&P Clinical Course as of 07/21/22 1549  Tue Jul 21, 3239  [id number] 52 year old male with history of peripheral edema with increased swelling on his legs after he has been going down on his Lasix dose due to muscle cramps and other issues.  He denies any shortness of breath fevers cough.  He has significant edema both lower extremities although no gross signs of infection.  Getting some lab work will need some IV diuresis if there is no other acute findings. [MB]    Clinical Course User Index [MB] Hayden Rasmussen, MD                             Medical Decision Making Amount and/or Complexity of Data Reviewed Labs: ordered. Radiology: ordered.   This patient presents to the ED for bilateral leg swelling, this involves an extensive number of treatment options, and is a complaint that  carries with a high risk of complications and morbidity.  The differential diagnosis includes CHF, DVT, cellulitis, infectious etiology.  This is not an exhaustive list.  Lab tests: I ordered and personally interpreted labs.  The pertinent results include: WBC 3.2. Hbg unremarkable. Platelets unremarkable. Electrolytes unremarkable. BUN, creatinine unremarkable.   Imaging studies: I ordered imaging studies. I personally reviewed, interpreted imaging and agree with the radiologist's interpretations. The results include: Chest x-ray showed no pleural effusion or pulmonary edema.  Ultrasound of the lower extremity negative for DVT.  Problem list/ ED course/ Critical interventions/ Medical management: HPI: See above Vital signs within normal range and stable throughout visit. Laboratory/imaging studies significant for: See above. On physical examination, patient is afebrile and appears in no acute distress.  There was nonpitting edema on both of his legs, left more than right.  Patient recently reduced his Lasix from 40 mg to 20 mg due to side effects of the medication.  This is likely the cause of fluid retention and leg edema.  This could also be due to CHF exacerbation.  BNP ordered and pending.  Lasix ordered.  Plan is to follow-up with lab and diuresis therapy.   I have reviewed the patient home medicines and have made adjustments as needed.  Cardiac monitoring/EKG: The patient was maintained on a cardiac monitor.  I personally reviewed and interpreted the cardiac monitor which showed an underlying rhythm of: sinus rhythm.  Additional history obtained: External records from outside source obtained and reviewed including: Chart review including previous notes, labs, imaging.  Consultations obtained:  Disposition Patient care signed out at shift change to Lauren, PA-C with pending labs and diuresis therapy. This chart was dictated using voice recognition software.  Despite best efforts to  proofread,  errors can occur which can change the documentation meaning.  Final Clinical Impression(s) / ED Diagnoses Final diagnoses:  Leg edema    Rx / DC Orders ED Discharge Orders     None         Rex Kras, [place] 07/21/22 1549    Hayden Rasmussen, MD 07/22/22 [phone]

## 2022-07-21 NOTE — ED Triage Notes (Signed)
Pt arrived POV from home c/o bilateral leg swelling x2 days. Pt states he takes lasix but has probably missed doses because he was trying to wean himself off.

## 2022-07-21 NOTE — Discharge Instructions (Addendum)
You were seen in the ER today for leg swelling.  We apologize for your wait on your lab testing -- your kidney function looks good today. Your potassium is a little low. I want you to take your lasix twice a day for the next 4 days, and I am prescribing you potassium replacement to take during that time as well, since lasix can drop your potassium levels.  Please follow up next week with your primary doctor for repeat lab testing.  Continue to monitor how you're doing and return to the ER for new or worsening symptoms such as increasing fatigue, chest pain, or shortness of breath.

## 2022-07-21 NOTE — ED Notes (Signed)
Pt O2 was 97%-100% while ambulating.

## 2023-01-22 ENCOUNTER — Other Ambulatory Visit: Payer: Self-pay | Admitting: Family Medicine

## 2023-01-22 DIAGNOSIS — Z1212 Encounter for screening for malignant neoplasm of rectum: Secondary | ICD-10-CM

## 2023-01-22 DIAGNOSIS — Z1211 Encounter for screening for malignant neoplasm of colon: Secondary | ICD-10-CM

## 2023-05-06 IMAGING — CR DG LUMBAR SPINE COMPLETE 4+V
5 series · 5 of 5 positions shown · non-contrast
Comparison: 06/12/2021.  Lumbar MRI, 07/09/2021.

CLINICAL DATA: Fall, landing on the buttocks.  Back pain.

EXAM:
LUMBAR SPINE - COMPLETE 4+ VIEW

[l-spine ap]
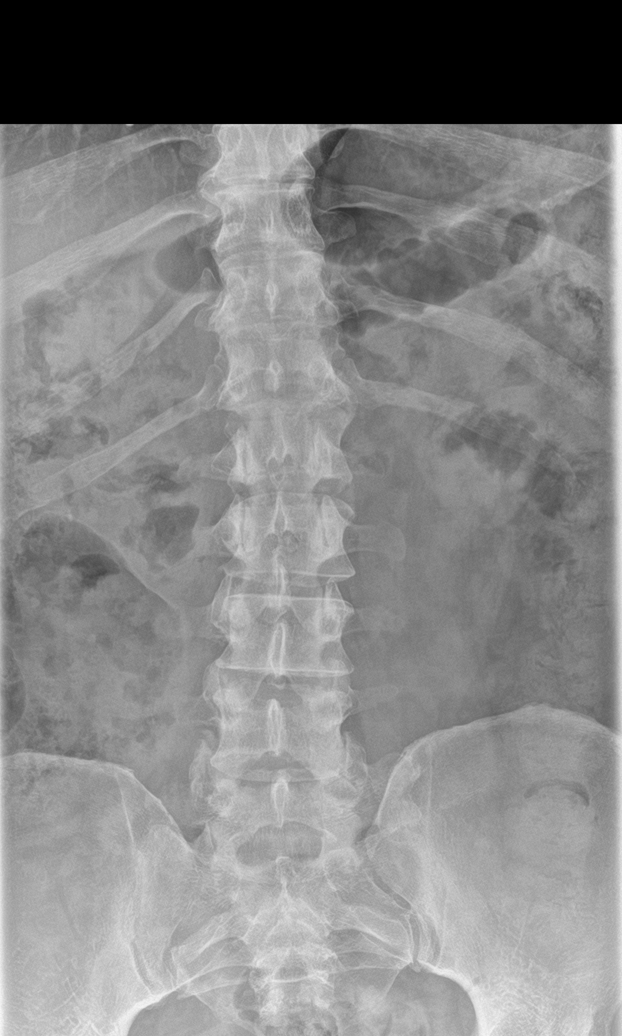

[l-spine obl (1 of 2)]
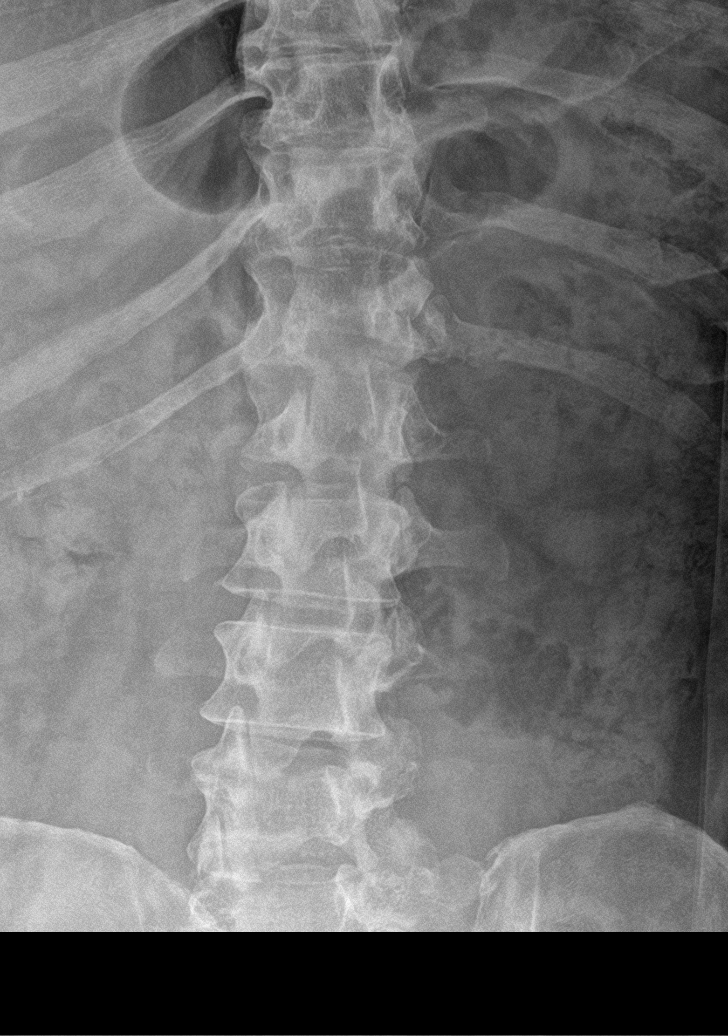

[l-spine obl (2 of 2)]
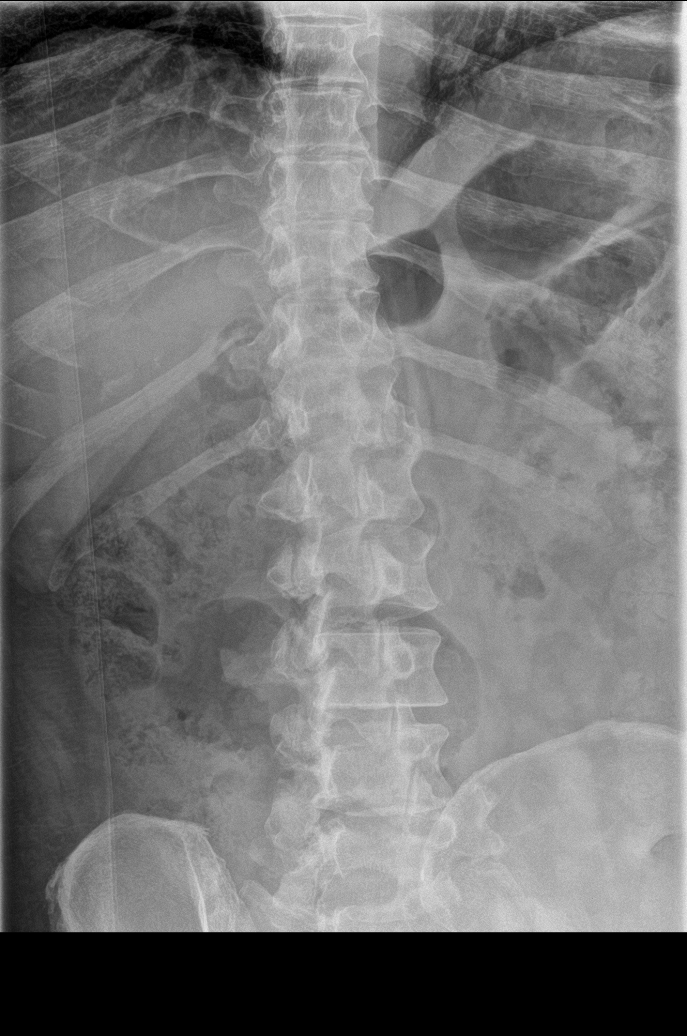

[l-spine lat]
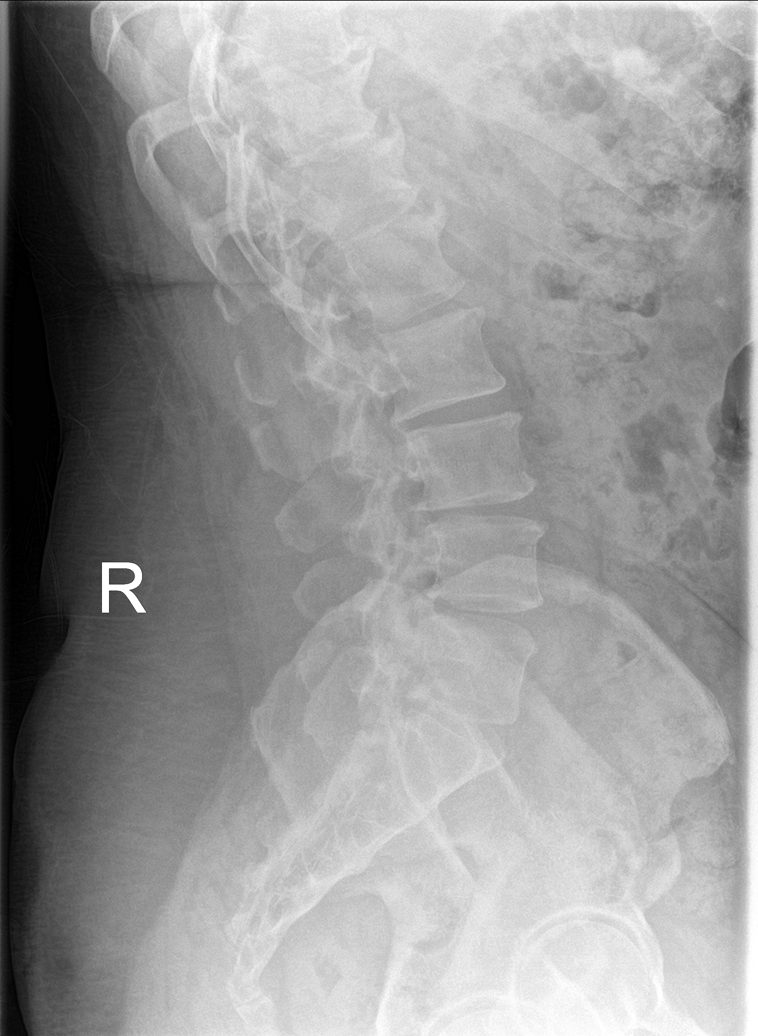

[l-spine spot]
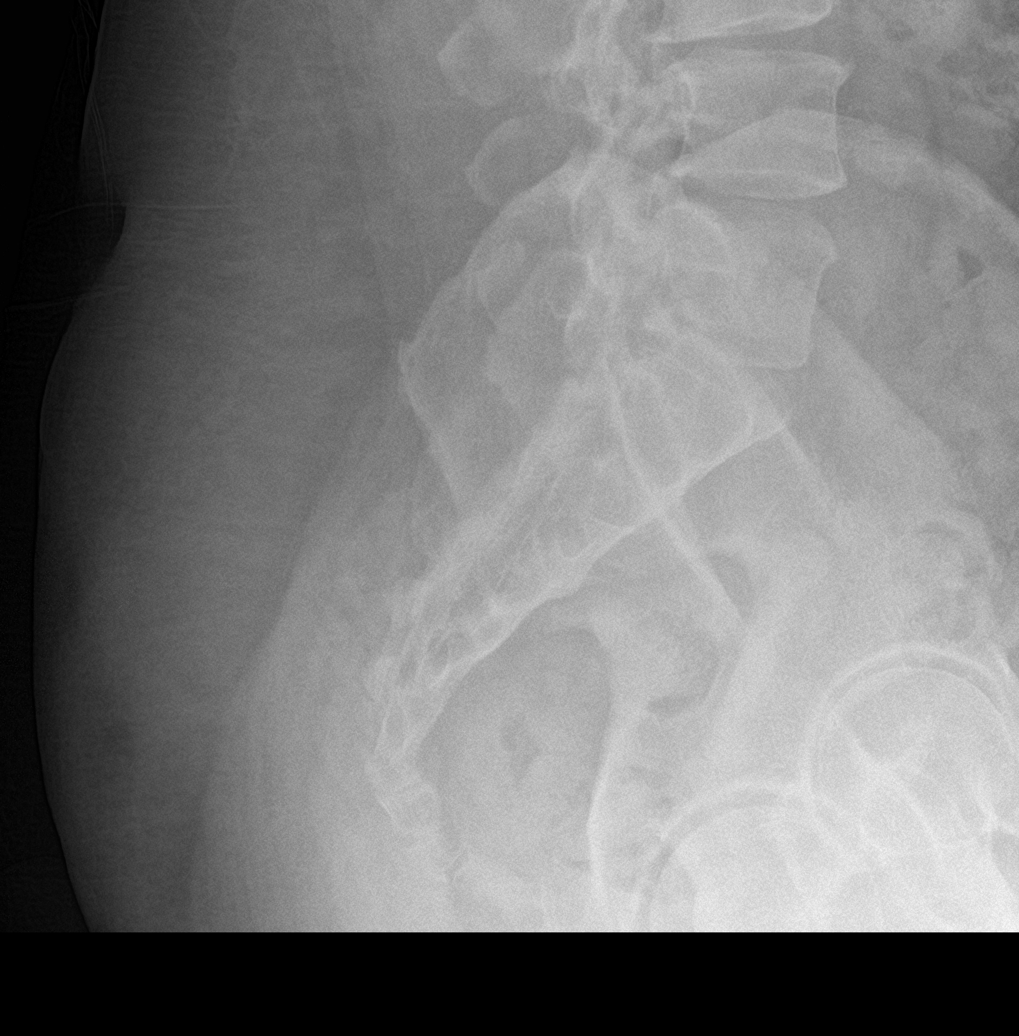

[5 of 5 positions shown; findings below may reference images not displayed]

FINDINGS: No fracture, bone lesion or spondylolisthesis.

Mild loss of disc height at L3-L4 and L4-L5. Remaining disc spaces
are well preserved. Minor anterior endplate osteophytes from the
lower thoracic spine through L5.

Facet joints are relatively well maintained.

Soft tissues are unremarkable.
IMPRESSION: 1. No fracture or acute finding.
2. Mild disc degenerative changes as detailed, stable from the prior
exam.

## 2023-05-06 IMAGING — CR DG THORACIC SPINE 2V
3 series · 3 of 3 positions shown · non-contrast
Comparison: Prior lumbar spine radiographs. Lateral chest
radiograph dated 01/07/2020.

CLINICAL DATA: Recent fall landing on the buttocks. Mid and low
back pain.

EXAM:
THORACIC SPINE 2 VIEWS

[t-spine ap]
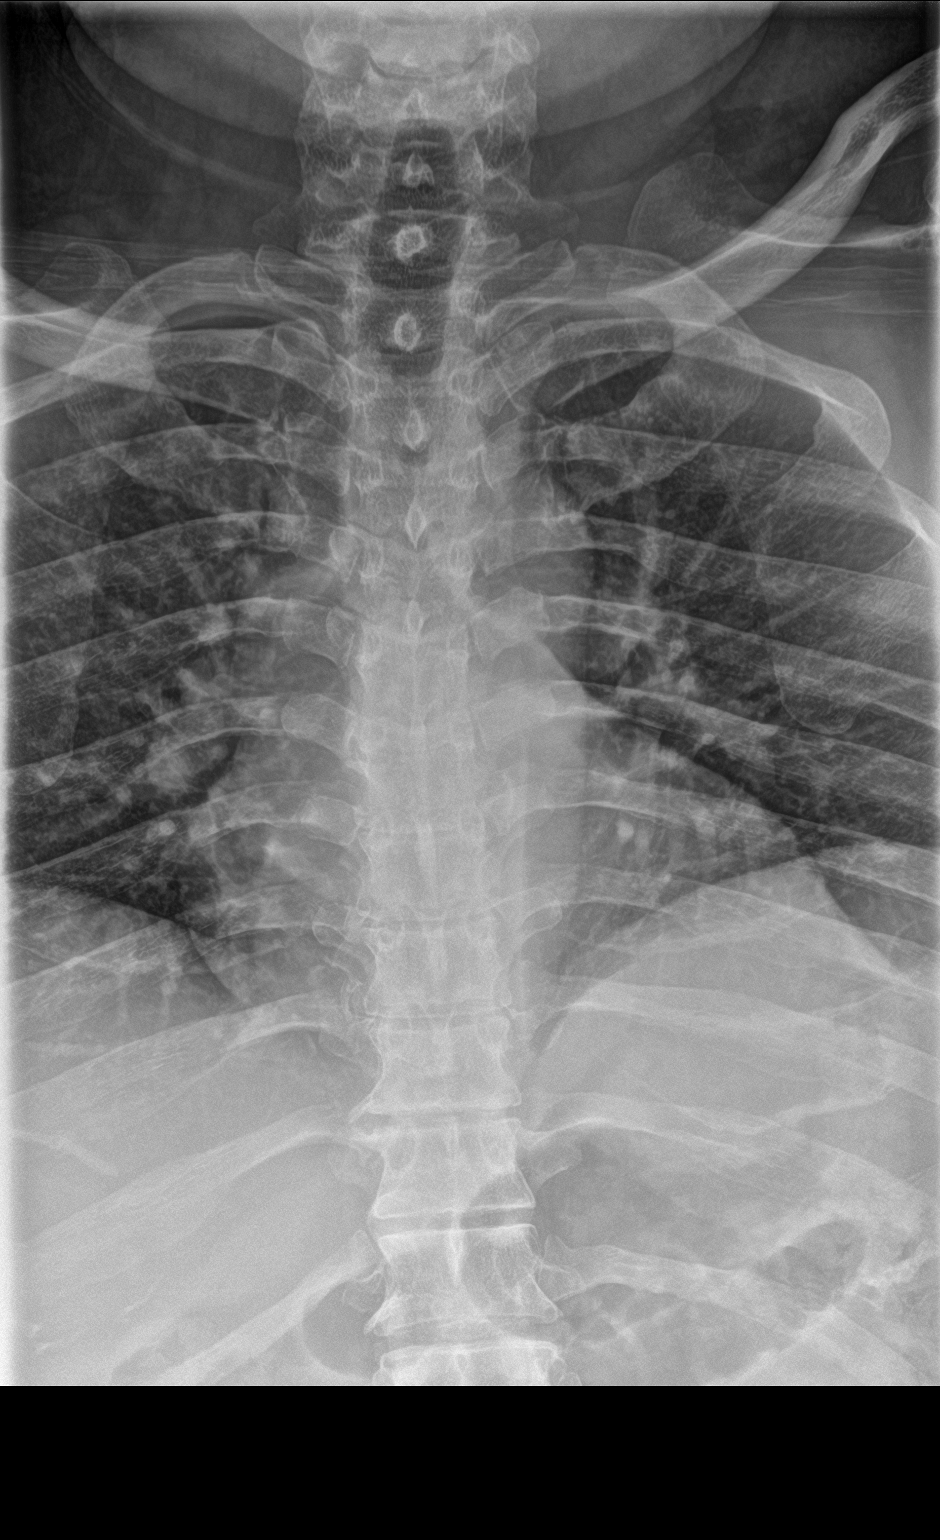

[t-spine lat]
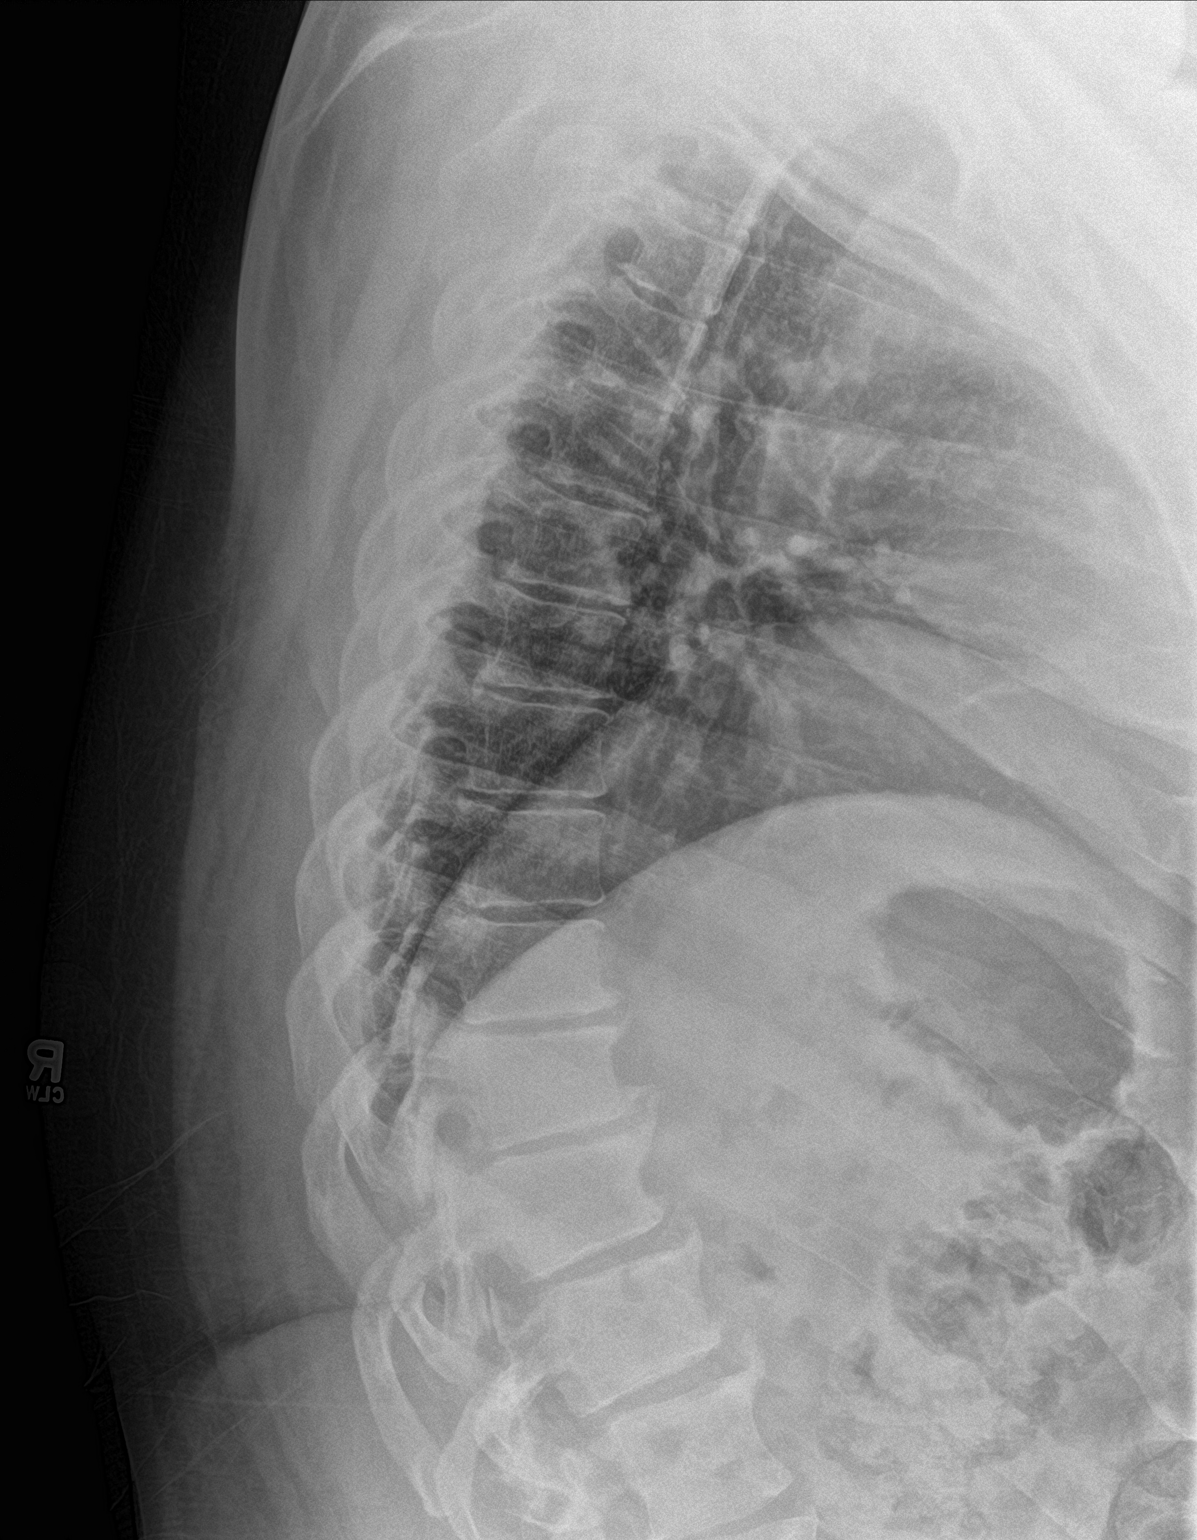

[t-spine swimmers]
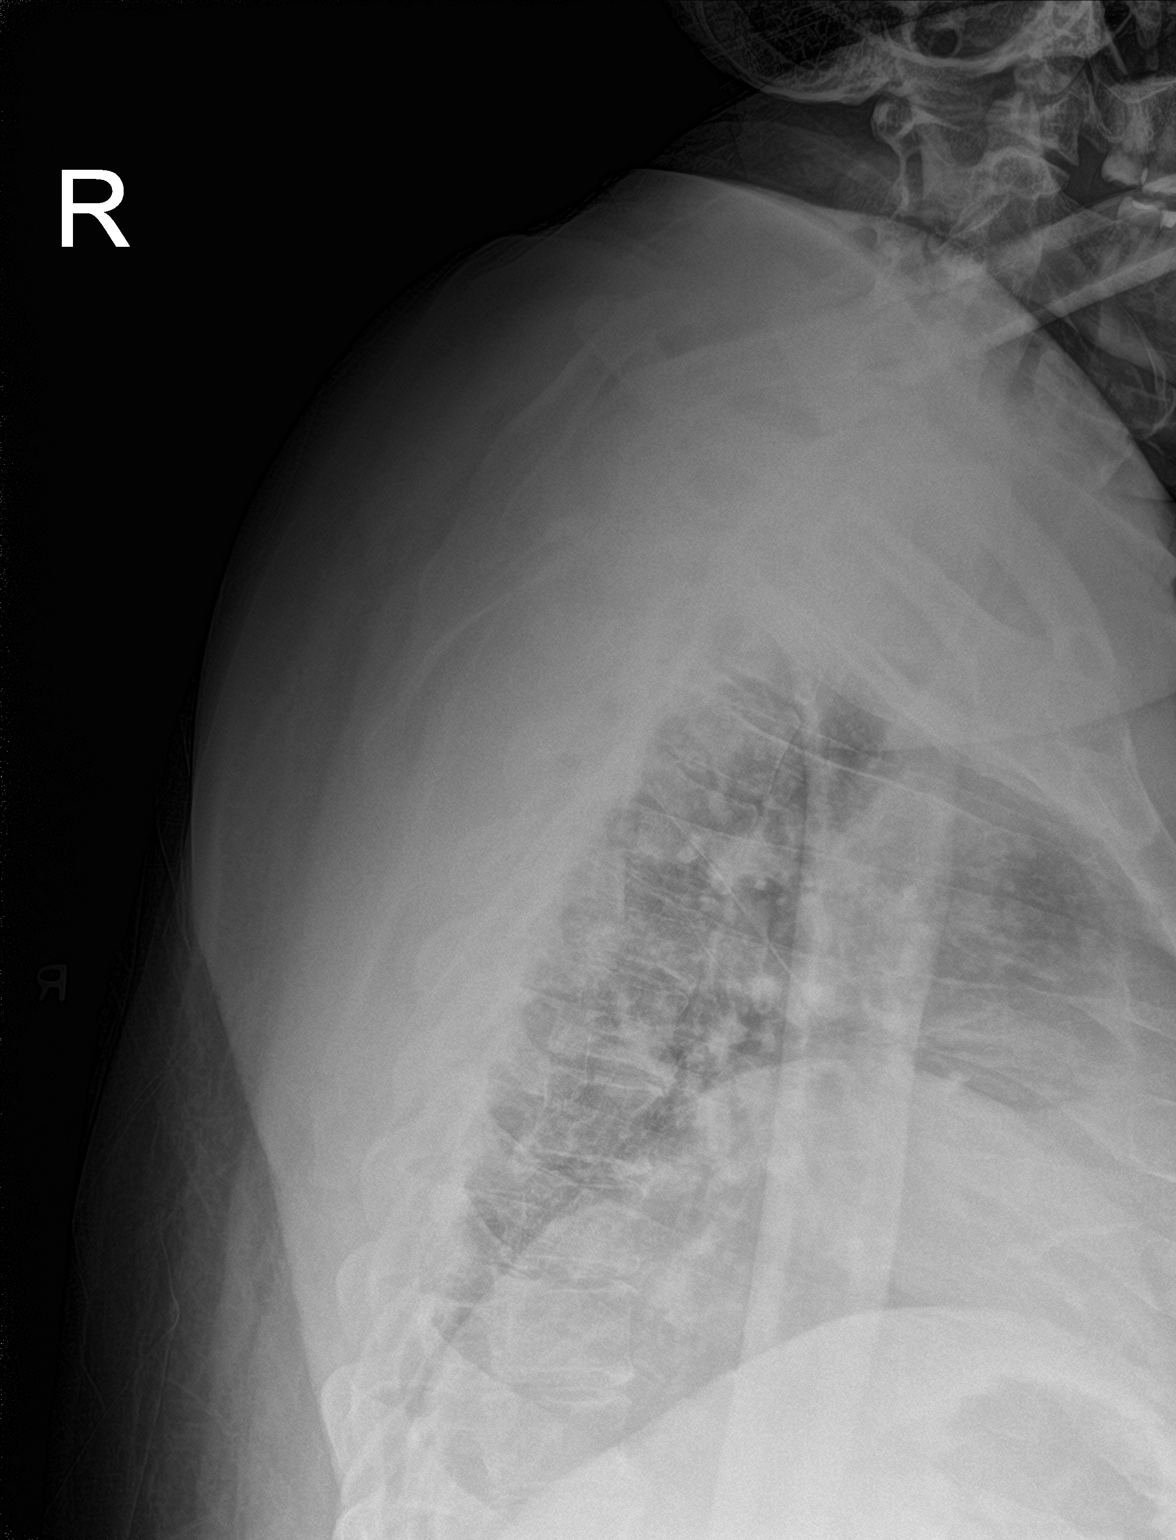

[3 of 3 positions shown; findings below may reference images not displayed]

FINDINGS: No fracture, bone lesion or spondylolisthesis.

Mild loss of disc height along the mid to lower thoracic spine with
small anterior endplate osteophytes.

Soft tissues are unremarkable.
IMPRESSION: 1. No fracture or acute finding.  No malalignment.
2. Mild disc degenerative changes.

## 2023-08-08 ENCOUNTER — Other Ambulatory Visit: Payer: Self-pay

## 2023-08-08 ENCOUNTER — Emergency Department (HOSPITAL_COMMUNITY)
Admission: EM | Admit: 2023-08-08 | Discharge: 2023-08-09 | Disposition: A | Discharge status: Home / Self Care | Attending: Emergency Medicine | Admitting: Emergency Medicine

## 2023-08-08 ENCOUNTER — Encounter (HOSPITAL_COMMUNITY): Payer: Self-pay | Admitting: *Deleted

## 2023-08-08 DIAGNOSIS — E119 Type 2 diabetes mellitus without complications: Secondary | ICD-10-CM | POA: Insufficient documentation

## 2023-08-08 DIAGNOSIS — R600 Localized edema: Secondary | ICD-10-CM

## 2023-08-08 DIAGNOSIS — Z794 Long term (current) use of insulin: Secondary | ICD-10-CM | POA: Insufficient documentation

## 2023-08-08 DIAGNOSIS — Z87891 Personal history of nicotine dependence: Secondary | ICD-10-CM | POA: Diagnosis not present

## 2023-08-08 DIAGNOSIS — I509 Heart failure, unspecified: Secondary | ICD-10-CM | POA: Insufficient documentation

## 2023-08-08 DIAGNOSIS — I739 Peripheral vascular disease, unspecified: Secondary | ICD-10-CM | POA: Insufficient documentation

## 2023-08-08 DIAGNOSIS — I110 Hypertensive heart disease with heart failure: Secondary | ICD-10-CM | POA: Insufficient documentation

## 2023-08-08 DIAGNOSIS — Z7984 Long term (current) use of oral hypoglycemic drugs: Secondary | ICD-10-CM | POA: Insufficient documentation

## 2023-08-08 DIAGNOSIS — M7989 Other specified soft tissue disorders: Secondary | ICD-10-CM | POA: Diagnosis present

## 2023-08-08 LAB — CBC WITH DIFFERENTIAL/PLATELET
Abs Immature Granulocytes: 0.01 10*3/uL (ref 0.00–0.07)
Basophils Absolute: 0 10*3/uL (ref 0.0–0.1)
Basophils Relative: 1 %
Eosinophils Absolute: 0.1 10*3/uL (ref 0.0–0.5)
Eosinophils Relative: 2 %
HCT: 45.6 % (ref 39.0–52.0)
Hemoglobin: 15.4 g/dL (ref 13.0–17.0)
Immature Granulocytes: 0 %
Lymphocytes Relative: 33 %
Lymphs Abs: 1.4 10*3/uL (ref 0.7–4.0)
MCH: 27.3 pg (ref 26.0–34.0)
MCHC: 33.8 g/dL (ref 30.0–36.0)
MCV: 80.9 fL (ref 80.0–100.0)
Monocytes Absolute: 0.3 10*3/uL (ref 0.1–1.0)
Monocytes Relative: 8 %
Neutro Abs: 2.4 10*3/uL (ref 1.7–7.7)
Neutrophils Relative %: 56 %
Platelets: 224 10*3/uL (ref 150–400)
RBC: 5.64 MIL/uL (ref 4.22–5.81)
RDW: 13.3 % (ref 11.5–15.5)
WBC: 4.2 10*3/uL (ref 4.0–10.5)
nRBC: 0 % (ref 0.0–0.2)

## 2023-08-08 LAB — COMPREHENSIVE METABOLIC PANEL WITH GFR
ALT: 26 U/L (ref 0–44)
AST: 22 U/L (ref 15–41)
Albumin: 3.7 g/dL (ref 3.5–5.0)
Alkaline Phosphatase: 70 U/L (ref 38–126)
Anion gap: 12 (ref 5–15)
BUN: 8 mg/dL (ref 6–20)
CO2: 26 mmol/L (ref 22–32)
Calcium: 9.7 mg/dL (ref 8.9–10.3)
Chloride: 95 mmol/L — ABNORMAL LOW (ref 98–111)
Creatinine, Ser: 1.02 mg/dL (ref 0.61–1.24)
GFR, Estimated: >60 mL/min (ref >60)
Glucose, Bld: 313 mg/dL — ABNORMAL HIGH (ref 70–99)
Potassium: 3.9 mmol/L (ref 3.5–5.1)
Sodium: 133 mmol/L — ABNORMAL LOW (ref 135–145)
Total Bilirubin: 1 mg/dL (ref 0.0–1.2)
Total Protein: 6.8 g/dL (ref 6.5–8.1)

## 2023-08-08 LAB — BRAIN NATRIURETIC PEPTIDE: B Natriuretic Peptide: 6.2 pg/mL (ref 0.0–100.0)

## 2023-08-08 NOTE — ED Triage Notes (Signed)
 The pt reports that he gets his med from [place] but he has been on vacation and has not been there  lately

## 2023-08-08 NOTE — ED Triage Notes (Signed)
 The pt is here with swelling to both his legs from the knee down for 3-4 months

## 2023-08-08 NOTE — ED Provider Triage Note (Signed)
 Emergency Medicine Provider Triage Evaluation Note  Victor [name] , [name] 53 y.o. male  was evaluated in triage.  Pt complains of leg swelling.  Patient reports has been ongoing for several months.  States that this is notably worsening but denies any feelings of shortness of breath or chest pain.  He was previously on [name] diuretic but states he has not been on this.  He does endorse history of type 2 diabetes.  Review of Systems  Positive: As above Negative: As above  Physical Exam  BP 120/80 (BP Location: Left Arm)   Pulse 76   Temp 98.3 F (36.8 C)   Resp 16   Ht 5\' 9"  (1.753 m)   Wt 120.2 kg   SpO2 100%   BMI 39.13 kg/m  Gen:   Awake, no distress   Resp:  Normal effort  MSK:   Moves extremities without difficulty  Other:  4+ pitting edema to the left lower extremity, 3+ pitting edema to the left lower extremity with edema extending from bilateral feet up through the knees.  Medical Decision Making  Medically screening exam initiated at 5:57 PM.  Appropriate orders placed.  Victor [name] was informed that the remainder of the evaluation will be completed by another provider, this initial triage assessment does not replace that evaluation, and the importance of remaining in the ED until their evaluation is complete.     Victor Fortin A, PA-C 08/08/23 1757

## 2023-08-09 MED ORDER — FUROSEMIDE 10 MG/ML IJ SOLN
40.0000 mg | Freq: Once | INTRAMUSCULAR | Status: AC
  Administered 2023-08-09: 40 mg via INTRAVENOUS
  Filled 2023-08-09: qty 4

## 2023-08-09 MED ORDER — POTASSIUM CHLORIDE CRYS ER 20 MEQ PO TBCR
40.0000 meq | EXTENDED_RELEASE_TABLET | Freq: Once | ORAL | Status: AC
  Administered 2023-08-09: 40 meq via ORAL
  Filled 2023-08-09: qty 2

## 2023-08-09 MED ORDER — LACTATED RINGERS IV BOLUS
500.0000 mL | Freq: Once | INTRAVENOUS | Status: AC
  Administered 2023-08-09: 500 mL via INTRAVENOUS

## 2023-08-09 MED ORDER — FUROSEMIDE 20 MG PO TABS: 20.0000 mg | ORAL_TABLET | Freq: Every day | ORAL | 0 refills | Status: DC

## 2023-08-09 NOTE — ED Provider Notes (Signed)
 [facility]MENT AT [facility] Provider Note   CSN: [id number] Arrival date & time: 08/08/23  1646     History  Chief Complaint  Patient presents with   Leg Swelling    Victor [name] is a 53 y.o. male.  Patient with past medical history significant for type II DM, hypertension, chronic congestive heart failure, PSVT, obesity, chronic venous insufficiency presents the emergency room complaining of worsening bilateral lower extremity swelling.  He states that has been worsening over the past 3 to 4 months.  He states that has been getting harder to put his compression stockings on.  He has not been taking Lasix  recently, has been out of medication and not been to his primary care in some time.  He denies shortness of breath, abdominal pain, chest pain.  He reports the last time his leg swelled like this he was given "liquid Lasix " which was apparently IV Lasix  which helped resolve his symptoms.  HPI     Home Medications Prior to Admission medications   Medication Sig Start Date End Date Taking? Authorizing Provider  furosemide  (LASIX ) 20 MG tablet Take 1 tablet (20 mg total) by mouth daily. 08/09/23  Yes Elisa Guest, PA-C  empagliflozin  (JARDIANCE ) 25 MG TABS tablet Take 1 tablet (25 mg total) by mouth daily before breakfast. 05/16/21   Gwyndolyn Lerner, MD  glipiZIDE  (GLUCOTROL  XL) 10 MG 24 hr tablet Take 10 mg by mouth daily with breakfast.    [provider]  HYDROcodone -acetaminophen  (NORCO/VICODIN) 5-325 MG tablet Take 1 tablet by mouth 2 (two) times daily. 07/16/21   [provider]  insulin  glargine (LANTUS  SOLOSTAR) 100 UNIT/ML Solostar Pen Inject 15 Units into the skin every morning. And pen needles 1/day Patient taking differently: Inject 25 Units into the skin at bedtime. 07/23/21   Gwyndolyn Lerner, MD  Insulin  Pen Needle (RELION PEN NEEDLES) 32G X 4 MM MISC Inject 1x a day 01/01/22   Emilie Harden, MD  metFORMIN  (GLUCOPHAGE ) 500 MG  tablet Take 1,000 mg by mouth daily.    [provider]  metFORMIN  (GLUCOPHAGE -XR) 500 MG 24 hr tablet Take 2 tablets (1,000 mg total) by mouth daily. Patient not taking: Reported on 07/21/2022 05/16/21   Gwyndolyn Lerner, MD  potassium chloride  SA (KLOR-CON  M) 20 MEQ tablet Take 1 tablet (20 mEq total) by mouth 2 (two) times daily for 4 days. 07/21/22 07/25/22  Roemhildt, Lorin T, PA-C  rosuvastatin  (CRESTOR ) 10 MG tablet Take 1 tablet (10 mg total) by mouth daily. 10/01/21   Newlin, Enobong, MD  sertraline (ZOLOFT) 50 MG tablet Take 50 mg by mouth daily as needed (anxiety).    [provider]  sildenafil (REVATIO) 20 MG tablet Take 60-80 mg by mouth daily as needed (erectile dysfunction).    [provider]  triamcinolone  cream (KENALOG ) 0.1 % Apply 1 application topically 2 (two) times daily. Patient not taking: Reported on 07/21/2022 05/13/21   Mayers, Cari S, PA-C  TRULICITY  4.5 MG/0.5ML SOPN Inject 4.5 mg into the skin once a week. Patient taking differently: Inject 4.5 mg into the skin every Thursday. 09/22/21   Shamleffer, Ibtehal Jaralla, MD      Allergies    Lasix  Baran.Bodo ], Shellfish-derived products, and Coconut (cocos nucifera)    Review of Systems   Review of Systems  Physical Exam Updated Vital Signs BP 119/78 (BP Location: Left Arm)   Pulse 68   Temp 98 F (36.7 C) (Oral)   Resp 18   Ht  5\' 9"  (1.753 m)   Wt 120.2 kg   SpO2 100%   BMI 39.13 kg/m  Physical Exam Vitals and nursing note reviewed.  Constitutional:      General: He is not in acute distress.    Appearance: He is well-developed. He is obese.  HENT:     Head: Normocephalic and atraumatic.  Eyes:     Conjunctiva/sclera: Conjunctivae normal.  Cardiovascular:     Rate and Rhythm: Normal rate and regular rhythm.  Pulmonary:     Effort: Pulmonary effort is normal. No respiratory distress.     Breath sounds: Normal breath sounds.  Abdominal:     Palpations: Abdomen is soft.      Tenderness: There is no abdominal tenderness.  Musculoskeletal:        General: No swelling.     Cervical back: Neck supple.     Right lower leg: Edema present.     Left lower leg: Edema present.     Comments: 2+ edema in bilateral lower extremities  Skin:    General: Skin is warm and dry.     Capillary Refill: Capillary refill takes less than 2 seconds.  Neurological:     Mental Status: He is alert.  Psychiatric:        Mood and Affect: Mood normal.     ED Results / Procedures / Treatments   Labs (all labs ordered are listed, but only abnormal results are displayed) Labs Reviewed  COMPREHENSIVE METABOLIC PANEL WITH GFR - Abnormal; Notable for the following components:      Result Value   Sodium 133 (*)    Chloride 95 (*)    Glucose, Bld 313 (*)    All other components within normal limits  CBC WITH DIFFERENTIAL/PLATELET  BRAIN NATRIURETIC PEPTIDE    EKG None  Radiology No results found.  Procedures Procedures    Medications Ordered in ED Medications  furosemide  (LASIX ) injection 40 mg (40 mg Intravenous Given 08/09/23 0058)  potassium chloride  SA (KLOR-CON  M) CR tablet 40 mEq (40 mEq Oral Given 08/09/23 0057)  potassium chloride  SA (KLOR-CON  M) CR tablet 40 mEq (40 mEq Oral Given 08/09/23 0308)  lactated ringers  bolus 500 mL (0 mLs Intravenous Stopped 08/09/23 0325)    ED Course/ Medical Decision Making/ A&P                                 Medical Decision Making Risk Prescription drug management.   This patient presents to the ED for concern of bilateral leg swelling, this involves an extensive number of treatment options, and is a complaint that carries with it a high risk of complications and morbidity.  The differential diagnosis includes peripheral edema, DVT, cellulitis, others   Co morbidities that complicate the patient evaluation  History of peripheral edema on Lasix , peripheral vascular disease   Additional history obtained:   External  records from outside source obtained and reviewed including med review showing patient used to be on 60 mg of Lasix  daily, reduced to 20 mg due to cramping   Lab Tests:  I Ordered, and personally interpreted labs.  The pertinent results include: Grossly unremarkable BNP, CBC, CMP   Problem List / ED Course / Critical interventions / Medication management   I ordered medication including Lasix  for fluid retention, potassium for possible developing hypokalemia with Lasix  administration, LR bolus for muscle cramping Reevaluation of the patient after these medicines showed that  the patient improved I have reviewed the patients home medicines and have made adjustments as needed   Social Determinants of Health:  Patient is a former smoker   Test / Admission - Considered:  No indication at this time for admission.  Patient with presentation consistent with peripheral edema.  Slightly improved after Lasix  administration.  Patient with history of peripheral vascular disease.  Provide request referral to vascular surgery.  No sign of DVT.  No unilateral swelling.  No posterior calf tenderness.  Patient stable for discharge home at this time with return precautions.         Final Clinical Impression(s) / ED Diagnoses Final diagnoses:  Bilateral lower extremity edema  Peripheral vascular disease (HCC)    Rx / DC Orders ED Discharge Orders          Ordered    furosemide  (LASIX ) 20 MG tablet  Daily        08/09/23 0554              Elisa Guest, PA-C 08/09/23 0602    Rosealee Concha, MD 08/09/23 [phone]

## 2023-08-09 NOTE — Discharge Instructions (Addendum)
 Your workup tonight was reassuring.  You do appear to have swelling in your legs likely due to fluid overload.  Please follow-up with your primary care team for further evaluation.  I did prescribe 20 mg Lasix  to be taken twice daily as previously prescribed.  For your peripheral vascular disease I recommend following up with vascular surgery.  Contact information has been provided.  If you develop any life-threatening symptoms please return to the emergency department.

## 2023-08-11 LAB — HM DIABETES EYE EXAM

## 2023-09-02 ENCOUNTER — Emergency Department (HOSPITAL_COMMUNITY)

## 2023-09-02 ENCOUNTER — Emergency Department (HOSPITAL_COMMUNITY)
Admission: EM | Admit: 2023-09-02 | Discharge: 2023-09-03 | Disposition: A | Discharge status: Home / Self Care | Attending: Emergency Medicine | Admitting: Emergency Medicine

## 2023-09-02 ENCOUNTER — Other Ambulatory Visit: Payer: Self-pay

## 2023-09-02 ENCOUNTER — Encounter (HOSPITAL_COMMUNITY): Payer: Self-pay | Admitting: Emergency Medicine

## 2023-09-02 DIAGNOSIS — I509 Heart failure, unspecified: Secondary | ICD-10-CM | POA: Diagnosis not present

## 2023-09-02 DIAGNOSIS — E871 Hypo-osmolality and hyponatremia: Secondary | ICD-10-CM | POA: Insufficient documentation

## 2023-09-02 DIAGNOSIS — Z79899 Other long term (current) drug therapy: Secondary | ICD-10-CM | POA: Diagnosis not present

## 2023-09-02 DIAGNOSIS — R600 Localized edema: Secondary | ICD-10-CM | POA: Insufficient documentation

## 2023-09-02 DIAGNOSIS — I739 Peripheral vascular disease, unspecified: Secondary | ICD-10-CM | POA: Insufficient documentation

## 2023-09-02 DIAGNOSIS — E1165 Type 2 diabetes mellitus with hyperglycemia: Secondary | ICD-10-CM | POA: Insufficient documentation

## 2023-09-02 DIAGNOSIS — I110 Hypertensive heart disease with heart failure: Secondary | ICD-10-CM | POA: Insufficient documentation

## 2023-09-02 DIAGNOSIS — Z7984 Long term (current) use of oral hypoglycemic drugs: Secondary | ICD-10-CM | POA: Diagnosis not present

## 2023-09-02 DIAGNOSIS — Z794 Long term (current) use of insulin: Secondary | ICD-10-CM | POA: Insufficient documentation

## 2023-09-02 NOTE — ED Triage Notes (Addendum)
 Pt in with BLE edema - hx of DM and CHF. Pt was seen early April for same, but has not been taking daily Lasix  due to severe leg cramping, has not been taking prescribed K either. +3 pitting edema bilateral feet. No warmth or redness noted. Denies any sob or cp

## 2023-09-03 LAB — CBC
HCT: 42.5 % (ref 39.0–52.0)
Hemoglobin: 13.7 g/dL (ref 13.0–17.0)
MCH: 26.7 pg (ref 26.0–34.0)
MCHC: 32.2 g/dL (ref 30.0–36.0)
MCV: 82.8 fL (ref 80.0–100.0)
Platelets: 216 10*3/uL (ref 150–400)
RBC: 5.13 MIL/uL (ref 4.22–5.81)
RDW: 13.2 % (ref 11.5–15.5)
WBC: 5 10*3/uL (ref 4.0–10.5)
nRBC: 0 % (ref 0.0–0.2)

## 2023-09-03 LAB — BASIC METABOLIC PANEL WITH GFR
Anion gap: 7 (ref 5–15)
BUN: 14 mg/dL (ref 6–20)
CO2: 25 mmol/L (ref 22–32)
Calcium: 8.8 mg/dL — ABNORMAL LOW (ref 8.9–10.3)
Chloride: 102 mmol/L (ref 98–111)
Creatinine, Ser: 1.02 mg/dL (ref 0.61–1.24)
GFR, Estimated: >60 mL/min (ref >60)
Glucose, Bld: 429 mg/dL — ABNORMAL HIGH (ref 70–99)
Potassium: 3.8 mmol/L (ref 3.5–5.1)
Sodium: 134 mmol/L — ABNORMAL LOW (ref 135–145)

## 2023-09-03 LAB — BRAIN NATRIURETIC PEPTIDE: B Natriuretic Peptide: 7.6 pg/mL (ref 0.0–100.0)

## 2023-09-03 LAB — TROPONIN I (HIGH SENSITIVITY)
Troponin I (High Sensitivity): 5 ng/L (ref <18)
Troponin I (High Sensitivity): 5 ng/L (ref <18)

## 2023-09-03 LAB — CBG MONITORING, ED: Glucose-Capillary: 291 mg/dL — ABNORMAL HIGH (ref 70–99)

## 2023-09-03 LAB — MAGNESIUM: Magnesium: 1.7 mg/dL (ref 1.7–2.4)

## 2023-09-03 MED ORDER — ACETAMINOPHEN 325 MG PO TABS: 650.0000 mg | ORAL_TABLET | Freq: Once | ORAL | Status: DC

## 2023-09-03 NOTE — Discharge Instructions (Signed)
 It was a pleasure caring for you today.  Please follow-up with your primary care provider and your vascular specialist.  Seek emergency care experiencing any new or worsening symptoms.  Alternating between 650 mg Tylenol  and 400 mg Advil: The best way to alternate taking Acetaminophen  (example Tylenol ) and Ibuprofen (example Advil/Motrin) is to take them 3 hours apart. For example, if you take ibuprofen at 6 am you can then take Tylenol  at 9 am. You can continue this regimen throughout the day, making sure you do not exceed the recommended maximum dose for each drug.

## 2023-09-03 NOTE — ED Provider Notes (Signed)
 [facility] EMERGENCY DEPARTMENT AT [facility] Provider Note   CSN: [id number] Arrival date & time: 09/02/23  2243     History  Chief Complaint  Patient presents with   Leg Swelling    Victor [name] is a 53 y.o. male with PMHx CHF, OA, HLD, HTN, DM who presents to ED concerned for BL LE swelling for at least 4 months. Patient stating that he has an appointment with a vein specialist in around 2 weeks, but his legs were hurting and he wanted some relief so he came into ED today. Patient has not been taking lasix  and states that compression stockings have not been relieving his symptoms. Patient also concerned that the skin around his BL medial malleolus are becoming darker. Patient has not taken any OTC medications for his symptoms recently.   Denies fever, chest pain, SOB, nausea, vomiting, diarrhea.   HPI     Home Medications Prior to Admission medications   Medication Sig Start Date End Date Taking? Authorizing Provider  empagliflozin  (JARDIANCE ) 25 MG TABS tablet Take 1 tablet (25 mg total) by mouth daily before breakfast. 05/16/21   Gwyndolyn Lerner, MD  furosemide  (LASIX ) 20 MG tablet Take 1 tablet (20 mg total) by mouth daily. 08/09/23   Elisa Guest, PA-C  glipiZIDE  (GLUCOTROL  XL) 10 MG 24 hr tablet Take 10 mg by mouth daily with breakfast.    [provider]  HYDROcodone -acetaminophen  (NORCO/VICODIN) 5-325 MG tablet Take 1 tablet by mouth 2 (two) times daily. 07/16/21   [provider]  insulin  glargine (LANTUS  SOLOSTAR) 100 UNIT/ML Solostar Pen Inject 15 Units into the skin every morning. And pen needles 1/day Patient taking differently: Inject 25 Units into the skin at bedtime. 07/23/21   Gwyndolyn Lerner, MD  Insulin  Pen Needle (RELION PEN NEEDLES) 32G X 4 MM MISC Inject 1x a day 01/01/22   Emilie Harden, MD  metFORMIN  (GLUCOPHAGE ) 500 MG tablet Take 1,000 mg by mouth daily.    [provider]  metFORMIN  (GLUCOPHAGE -XR) 500 MG 24 hr tablet  Take 2 tablets (1,000 mg total) by mouth daily. Patient not taking: Reported on 07/21/2022 05/16/21   Gwyndolyn Lerner, MD  potassium chloride  SA (KLOR-CON  M) 20 MEQ tablet Take 1 tablet (20 mEq total) by mouth 2 (two) times daily for 4 days. 07/21/22 07/25/22  Roemhildt, Lorin T, PA-C  rosuvastatin  (CRESTOR ) 10 MG tablet Take 1 tablet (10 mg total) by mouth daily. 10/01/21   Newlin, Enobong, MD  sertraline (ZOLOFT) 50 MG tablet Take 50 mg by mouth daily as needed (anxiety).    [provider]  sildenafil (REVATIO) 20 MG tablet Take 60-80 mg by mouth daily as needed (erectile dysfunction).    [provider]  triamcinolone  cream (KENALOG ) 0.1 % Apply 1 application topically 2 (two) times daily. Patient not taking: Reported on 07/21/2022 05/13/21   Mayers, Cari S, PA-C  TRULICITY  4.5 MG/0.5ML SOPN Inject 4.5 mg into the skin once a week. Patient taking differently: Inject 4.5 mg into the skin every Thursday. 09/22/21   Shamleffer, Ibtehal Jaralla, MD      Allergies    Lasix  [furosemide ], Shellfish-derived products, and Coconut (cocos nucifera)    Review of Systems   Review of Systems  Cardiovascular:  Positive for leg swelling.    Physical Exam Updated Vital Signs BP (!) 140/91 (BP Location: Right Arm)   Pulse (!) 56   Temp 97.8 F (36.6 C)   Resp 18   Wt 120.2 kg  SpO2 100%   BMI 39.13 kg/m  Physical Exam Vitals and nursing note reviewed.  Constitutional:      General: He is not in acute distress.    Appearance: He is not ill-appearing or toxic-appearing.  HENT:     Head: Normocephalic and atraumatic.     Mouth/Throat:     Mouth: Mucous membranes are moist.  Eyes:     General: No scleral icterus.       Right eye: No discharge.        Left eye: No discharge.     Conjunctiva/sclera: Conjunctivae normal.  Cardiovascular:     Rate and Rhythm: Normal rate.     Pulses: Normal pulses.     Heart sounds: No murmur heard. Pulmonary:     Effort: Pulmonary effort is normal.  No respiratory distress.     Breath sounds: No wheezing, rhonchi or rales.  Abdominal:     Tenderness: There is no abdominal tenderness.  Musculoskeletal:     Comments: +2 pitting edema BL LE. +1 pedal pulses. Sensation to light touch intact. Area non-tense. No erythema or increased warmth. Darkened skin along BL medial malleolus. No ulcerations.   Skin:    General: Skin is warm and dry.     Findings: No rash.  Neurological:     General: No focal deficit present.     Mental Status: He is alert. Mental status is at baseline.  Psychiatric:        Mood and Affect: Mood normal.     ED Results / Procedures / Treatments   Labs (all labs ordered are listed, but only abnormal results are displayed) Labs Reviewed  BASIC METABOLIC PANEL WITH GFR - Abnormal; Notable for the following components:      Result Value   Sodium 134 (*)    Glucose, Bld 429 (*)    Calcium  8.8 (*)    All other components within normal limits  CBG MONITORING, ED - Abnormal; Notable for the following components:   Glucose-Capillary 291 (*)    All other components within normal limits  CBC  BRAIN NATRIURETIC PEPTIDE  MAGNESIUM  TROPONIN I (HIGH SENSITIVITY)  TROPONIN I (HIGH SENSITIVITY)    EKG EKG Interpretation Date/Time:  Friday Sep 03 2023 10:25:31 EDT Ventricular Rate:  54 PR Interval:  118 QRS Duration:  94 QT Interval:  398 QTC Calculation: 377 R Axis:   7  Text Interpretation: Sinus bradycardia Minimal voltage criteria for LVH, may be normal variant ( R in aVL ) Borderline ECG When compared with ECG of 21-Jul-2022 12:54, PREVIOUS ECG IS PRESENT when compared to prior, similar appearance No STEMI Confirmed by Wynell Heath ([id number]) on 09/03/2023 10:31:06 AM  Radiology DG Chest 2 View Result Date: 09/02/2023 CLINICAL DATA:  Dyspnea EXAM: CHEST - 2 VIEW COMPARISON:  1424 FINDINGS: Lungs volumes are small, but are symmetric and are clear. No pneumothorax or pleural effusion. Cardiac size within normal  limits. Pulmonary vascularity is normal. Osseous structures are age-appropriate. No acute bone abnormality. IMPRESSION: 1. Pulmonary hypoinflation. Electronically Signed   By: Worthy Heads M.D.   On: 09/02/2023 23:41    Procedures Procedures    Medications Ordered in ED Medications  acetaminophen  (TYLENOL ) tablet 650 mg (has no administration in time range)    ED Course/ Medical Decision Making/ A&P  Medical Decision Making  This patient presents to the ED for concern of BL LE swelling, this involves an extensive number of treatment options, and is a complaint that carries with it a high risk of complications and morbidity.  The differential diagnosis includes CHF, AKI, PVD/PAD, DVT, compartment syndrome. This list is not exhaustive   Co morbidities that complicate the patient evaluation  CHF, OA, HLD, HTN, DM   Additional history obtained:  07/21/2022 VAS US  BL LE: no acute DVT 12/2019 ECHO: 60-65% EF    Problem List / ED Course / Critical interventions / Medication management  Patient presents to the ED concern for BL LE swelling for at least the past 4 months.  Patient is not taking his Lasix .  Patient stated the compression stockings is not resolving his symptoms.  Patient with an appointment with vascular specialist in 2 weeks.  Denies any concerning symptoms such as chest pain or shortness of breath. Physical exam with 2+ pitting edema to BL LE.  No erythema or increased warmth.  Legs seem N/V intact. Patient presented with same complaint last month.  DVT ultrasounds obtained last month without acute DVT. I Ordered, and personally interpreted labs.  BMP with slight hyponatremia at 134.  There is also hyperglycemia at 429 - repeat CBG showing glucose resolved to 291.  Troponin within normal limits.  BNP within normal limits.  Magnesium within normal limits.  CBC without leukocytosis or anemia. I ordered imaging studies including chest x-ray. I  independently visualized and interpreted imaging which showed no acute cardiopulmonary disease. I agree with the radiologist interpretation The patient was maintained on a cardiac monitor.  I personally viewed and interpreted the cardiac monitored which showed an underlying rhythm of: sinus rhythm - no acute changes. Patient has not recently taken anything for his pain.  I provided patient with Tylenol  in ED.  Educated patient on obtaining thigh-high compression stockings to help relieve his symptoms.  Recommended following up with the vascular specialist.  Patient verbalized understanding of plan. Staffed with Dr. Manus Sellers I have reviewed the patients home medicines and have made adjustments as needed The patient has been appropriately medically screened and/or stabilized in the ED. I have low suspicion for any other emergent medical condition which would require further screening, evaluation or treatment in the ED or require inpatient management. At time of discharge the patient is hemodynamically stable and in no acute distress. I have discussed work-up results and diagnosis with patient and answered all questions. Patient is agreeable with discharge plan. We discussed strict return precautions for returning to the emergency department and they verbalized understanding.     Social Determinants of Health:  none         Final Clinical Impression(s) / ED Diagnoses Final diagnoses:  PVD (peripheral vascular disease) (HCC)  Bilateral leg edema    Rx / DC Orders ED Discharge Orders     None         [place], [place] 09/03/23 1036    Tegeler, Marine Sia, MD 09/03/23 [phone]

## 2023-09-03 NOTE — ED Notes (Signed)
 Pt endorses eating some trail mix in the WR.

## 2023-09-03 NOTE — Inpatient Diabetes Management (Signed)
 Inpatient Diabetes Program Recommendations  AACE/ADA: New Consensus Statement on Inpatient Glycemic Control   Target Ranges:  Prepandial:   less than 140 mg/dL      Peak postprandial:   less than 180 mg/dL (1-2 hours)      Critically ill patients:  140 - 180 mg/dL    Latest Reference Range & Units 09/03/23 09:16  Glucose-Capillary 70 - 99 mg/dL 161 (H)    Latest Reference Range & Units 09/02/23 23:28  Glucose 70 - 99 mg/dL 096 (H)   Review of Glycemic Control  Diabetes history: DM2 Outpatient Diabetes medications: Trulicity  4.5 mg Qweek, Metformin  XR 1000 mg daily, Glipizide  XL 10 mg QAM, Jardiance  25 mg QAM Current orders for Inpatient glycemic control: None; in ED  Inpatient Diabetes Program Recommendations:    Insulin : Please consider ordering Semglee 12 units daily, CBGs AC&HS, Novolog  0-15 units TID with meals and Novolog  0-5 units at bedtime.  NOTE: Patient in ED with BLE edema.  In reviewing chart, noted patient sees Dr. Aldona Amel (Endocrinologist) and was last seen 01/01/2022. Per office note on 01/02/24 patient was prescribed "Metformin  ER 1000 mg 2x a day, with meals, Glipizide  XL 5 mg before breakfast, Jardiance  25 mg before breakfast, Trulicity  4.5 mg weekly, Lantus  15 units in am.  Thanks, Beacher Limerick, RN, MSN, CDCES Diabetes Coordinator Inpatient Diabetes Program [phone] (Team Pager from 8am to 5pm)

## 2023-09-20 ENCOUNTER — Encounter (HOSPITAL_COMMUNITY): Payer: Self-pay | Admitting: Cardiology

## 2023-09-20 ENCOUNTER — Ambulatory Visit (HOSPITAL_COMMUNITY)
Admission: RE | Admit: 2023-09-20 | Discharge: 2023-09-20 | Disposition: A | Discharge status: Home / Self Care | Source: Ambulatory Visit | Attending: Cardiology | Admitting: Cardiology

## 2023-09-20 VITALS — BP 118/78 | HR 84 | Ht 69.0 in | Wt 240.6 lb

## 2023-09-20 DIAGNOSIS — I5033 Acute on chronic diastolic (congestive) heart failure: Secondary | ICD-10-CM | POA: Diagnosis not present

## 2023-09-20 DIAGNOSIS — R600 Localized edema: Secondary | ICD-10-CM | POA: Diagnosis not present

## 2023-09-20 DIAGNOSIS — E119 Type 2 diabetes mellitus without complications: Secondary | ICD-10-CM | POA: Diagnosis not present

## 2023-09-20 NOTE — Patient Instructions (Signed)
 Your physician has requested that you have an echocardiogram. Echocardiography is a painless test that uses sound waves to create images of your heart. It provides your doctor with information about the size and shape of your heart and how well your heart's chambers and valves are working. This procedure takes approximately one hour. There are no restrictions for this procedure. Please do NOT wear cologne, perfume, aftershave, or lotions (deodorant is allowed). Please arrive 15 minutes prior to your appointment time.  Please note: We ask at that you not bring children with you during ultrasound (echo/ vascular) testing. Due to room size and safety concerns, children are not allowed in the ultrasound rooms during exams. Our front office staff cannot provide observation of children in our lobby area while testing is being conducted. An adult accompanying a patient to their appointment will only be allowed in the ultrasound room at the discretion of the ultrasound technician under special circumstances. We apologize for any inconvenience.  You have been referred to the lymphedema clinic. They will call you to arrange your appointment.  Your provider has ordered an ultra sound of your legs. You will be called to have this test arranged.  Your physician recommends that you schedule a follow-up appointment in: 4 months (October) ** PLEASE CALL THE OFFICE IN [place] TO ARRANGE YOUR FOLLOW UP APPOINTMENT.**  If you have any questions or concerns before your next appointment please send us  a message through [place] or call our office at [phone].    TO LEAVE A MESSAGE FOR THE NURSE SELECT OPTION 2, PLEASE LEAVE A MESSAGE INCLUDING: YOUR NAME DATE OF BIRTH CALL BACK NUMBER REASON FOR CALL**this is important as we prioritize the call backs  YOU WILL RECEIVE A CALL BACK THE SAME DAY AS LONG AS YOU CALL BEFORE 4:00 PM  At the [facility], you and your health needs are our priority. As  part of our continuing mission to provide you with exceptional heart care, we have created designated Provider Care Teams. These Care Teams include your primary Cardiologist (physician) and Advanced Practice Providers (APPs- Physician Assistants and Nurse Practitioners) who all work together to provide you with the care you need, when you need it.   You may see any of the following providers on your designated Care Team at your next follow up: Dr Jules Oar Dr Peder Bourdon Dr. Alwin Baars Dr. Arta Lark Amy Marijane Shoulders, NP Ruddy Corral, [place] [facility] [place], [place] Dennise Fitz, NP [place] Lee, NP Shawnee Dellen, NP Luster Salters, PharmD Bevely Brush, PharmD   Please be sure to bring in all your medications bottles to every appointment.    Thank you for choosing [facility]

## 2023-09-20 NOTE — Progress Notes (Incomplete)
   ADVANCED HEART FAILURE NEW PATIENT CLINIC NOTE  Referring Physician: Joaquin Mulberry, MD  Primary Care: Joaquin Mulberry, MD Primary Cardiologist:  HPI: Victor [name] is a 53 y.o. male with a PMH of diabetes and chronic lower extremity edema who presents for initial visit for further evaluation and treatment of chronic lower extremity edema    Patient has a longstanding history of bilateral lower extremity edema.  He was evaluated for this as far back as 2021, at which point in time he had a right and left heart catheterization that showed angiographically normal coronary arteries, low normal filling pressures including wedge of 9 and right atrial pressure of 4.  He has had multiple ultrasounds of his lower extremities without evidence of DVT or clot.  His last echocardiogram was also in 2021 which showed normal ejection fraction, normal RV systolic function, potential small PFO.  He has been trialed on every version of loop diuretics but has had significant cramping, especially with higher doses.  He has an "allergy" listed to Lasix , but was dose dependent and discussed that this was not likely not an allergy.    SUBJECTIVE:  Patient reports that over the past year he has had swelling of his lower extremities.  We discussed that he was evaluated for this back in 2021 when he reports that his gotten worse in the last year or so.  He has tried multiple types of diuretics with minimal improvement.  He has a history of diabetes and has some neuropathy pain that may be associated to the swelling as well as diabetes.  His last A1c was 8.8 in 2023.  PMH, current medications, allergies, social history, and family history reviewed in epic.  PHYSICAL EXAM: Vitals:   09/20/23 1454  BP: 118/78  Pulse: 84  SpO2: 98%   GENERAL: Well nourished and in no apparent distress at rest.  PULM:  Normal work of breathing, clear to auscultation bilaterally. Respirations are unlabored.  CARDIAC:  JVP: Flat          Normal rate with regular rhythm. No murmurs, rubs or gallops.  2+ nonpitting lower extremity edema bilaterally edema. Warm and well perfused extremities. ABDOMEN: Soft, non-tender, non-distended. NEUROLOGIC: Patient is oriented x3 with no focal or lateralizing neurologic deficits.    ASSESSMENT & PLAN:  Lower extremity swelling: Swelling is chronic and has been ongoing since at least 2021. Appearance (nonpitting) is not consistent with cardiogenic edema. Prior evaluation includes RHC with normal filling pressures. BNP normal twice in the past year. Given that his last echo was in 2021 it is reasonable to repeat to ensure no RV dysfunction, no evidence of constriction. - Holding on diuretics as this does not appear consistent with cardiogenic edema and suspect his cramping was due to low intravascular pressures - Echocardiogram - Repeat dopplers given patient concern, but unlikely clot based on exam - Referral to lymphedema clinic. Discussed leg wrapping and alternative etiologies - Does report occasional foamy urine. Protein normal on last CMP but if echo normal could consider urine studies to evaluate for nephrotic syndrome - If echocardiogram normal does not need further follow up in advanced HF clinic  Diabetes: Patient has self discontinued many of his medications. Instructed to follow up with PCP given known history of insulin  dependent diabetes.  Follow up in 3 months  Arta Lark, MD Advanced Heart Failure Mechanical Circulatory Support 09/22/23

## 2023-09-24 ENCOUNTER — Ambulatory Visit (HOSPITAL_COMMUNITY)
Admission: RE | Admit: 2023-09-24 | Discharge: 2023-09-24 | Disposition: A | Discharge status: Home / Self Care | Source: Ambulatory Visit | Attending: Cardiology | Admitting: Cardiology

## 2023-09-24 DIAGNOSIS — I5033 Acute on chronic diastolic (congestive) heart failure: Secondary | ICD-10-CM | POA: Diagnosis present

## 2023-11-02 ENCOUNTER — Telehealth: Payer: Self-pay | Admitting: Internal Medicine

## 2023-11-02 DIAGNOSIS — E119 Type 2 diabetes mellitus without complications: Secondary | ICD-10-CM

## 2023-11-02 MED ORDER — LANTUS SOLOSTAR 100 UNIT/ML ~~LOC~~ SOPN: 15.0000 [IU] | PEN_INJECTOR | Freq: Every day | SUBCUTANEOUS | 0 refills | Status: DC

## 2023-11-02 MED ORDER — RELION PEN NEEDLES 32G X 4 MM MISC: 2 refills | Status: DC

## 2023-11-02 NOTE — Addendum Note (Signed)
 Addended by: CLEOTILDE ROLIN RAMAN on: 11/02/2023 02:48 PM   Modules accepted: Orders

## 2023-11-02 NOTE — Telephone Encounter (Signed)
 Pt has not been seen since 2023. Was advised to come back in 3-4 months per last ov note. How would you like to proceed?   Current regimen:NO meds at all.  He states that he went on vacation last year and they took all his medications from him.   NO other readings. Today is the only day he has taken his blood sugar.

## 2023-11-02 NOTE — Telephone Encounter (Signed)
 [facility], need to know which pharmacy he uses.

## 2023-11-02 NOTE — Telephone Encounter (Signed)
 Patient is calling to say that his bloodsugar level is 415.  Patient states that he has not taken insulin  in about a year.  Patient states that he just got back from vacation.  Patient states that this is the first time he has taken it in a year.  He states that the reason he took it now is because he has not been feeling well and felt it was running high.

## 2023-11-02 NOTE — Telephone Encounter (Signed)
 Let's refill his insulin  and let me see him back in 2 days at 11 AM

## 2023-11-02 NOTE — Telephone Encounter (Signed)
 Rx has been sent

## 2023-11-02 NOTE — Addendum Note (Signed)
 Addended by: CLEOTILDE ROLIN RAMAN on: 11/02/2023 02:33 PM   Modules accepted: Orders

## 2023-11-04 ENCOUNTER — Encounter: Payer: Self-pay | Admitting: Internal Medicine

## 2023-11-04 ENCOUNTER — Ambulatory Visit: Admitting: Internal Medicine

## 2023-11-04 ENCOUNTER — Telehealth: Payer: Self-pay

## 2023-11-04 VITALS — BP 120/70 | HR 89 | Ht 69.0 in | Wt 233.4 lb

## 2023-11-04 DIAGNOSIS — Z794 Long term (current) use of insulin: Secondary | ICD-10-CM

## 2023-11-04 DIAGNOSIS — E1159 Type 2 diabetes mellitus with other circulatory complications: Secondary | ICD-10-CM | POA: Diagnosis not present

## 2023-11-04 DIAGNOSIS — E1165 Type 2 diabetes mellitus with hyperglycemia: Secondary | ICD-10-CM

## 2023-11-04 LAB — GLUCOSE, POCT (MANUAL RESULT ENTRY)

## 2023-11-04 LAB — POCT GLYCOSYLATED HEMOGLOBIN (HGB A1C): HbA1c POC (<> result, manual entry): >15 % (ref 4.0–5.6)

## 2023-11-04 MED ORDER — NOVOLOG FLEXPEN 100 UNIT/ML ~~LOC~~ SOPN: 8.0000 [IU] | PEN_INJECTOR | Freq: Three times a day (TID) | SUBCUTANEOUS | 3 refills | Status: DC

## 2023-11-04 MED ORDER — ACCU-CHEK SOFTCLIX LANCETS MISC: 12 refills | Status: DC

## 2023-11-04 MED ORDER — ACCU-CHEK GUIDE TEST VI STRP: ORAL_STRIP | 12 refills | Status: DC

## 2023-11-04 MED ORDER — ACCU-CHEK GUIDE W/DEVICE KIT
PACK | 0 refills | Status: AC
Start: 2023-11-04 — End: ?

## 2023-11-04 NOTE — Telephone Encounter (Signed)
 After the pt was seen he had more questions.  He wanted to know how much insulin  to take (Novolog )  I advised him to take 9 for breakfast, 9 for lunch and 9 for dinner. Since he was not very sure what he is exactly eating or how many carbs he is going to have.  2. He needed a new meter so I sent that in with all supplies  3. I supplied him with a Diabetes placemat so he can see how his portions should be on his plate along with what he should be eating 4. Advised him to cut out White potatoes, White rice and [employer].  5. Drink more water.  6. Cut out Sweets: cookies 7. I advised him to call me back in 2 weeks with blood sugars

## 2023-11-04 NOTE — Progress Notes (Signed)
 Patient ID: Victor [name], male   DOB: 10-24-1970, 53 y.o.   MRN: [id number]  HPI: Victor [name] is a 53 y.o.-year-old male, returning for follow-up for DM2, dx initially as prediabetes in 2012, then as DM in 12/2019, insulin -dependent since 09/2021, uncontrolled, with complications (CHF, PN). Pt. previously saw Dr. Kassie, but he had a visit with me 1 year and 10 months ago.  Interim history: He is thirsty and has increased urination. He also has leg swelling. He was in prison for 1 year - no diabetic medications! He had fainting spells 2x after getting out of prison, when he was homeless for 2 mo. He now has a place to stay. He started to improve his diet. Few days ago he started to recheck his blood sugars and they were elevated.  He contacted us  and we restarted insulin  and scheduled the appointment today. He had back pain and needs surgery  - HbA1c needs to be <8%.  Reviewed HbA1c: Lab Results  Component Value Date   HGBA1C 8.8 (A) 01/01/2022   HGBA1C 8.9 (A) 07/23/2021   HGBA1C 8.6 (A) 05/16/2021   HGBA1C 8.0 (A) 07/09/2020   HGBA1C 8.9 (A) 04/30/2020   HGBA1C 8.2 (A) 02/29/2020   HGBA1C 10.1 (H) 12/27/2019   At last visit he was on: - Metformin  ER 1000 >> 500 mg 2x a day, with meals (decreased 2/2 upset stomach) >> would not want start back - Glipizide  XL 5 mg before breakfast - Bromocriptine  2.5 mg daily - Jardiance  25 mg before breakfast - Trulicity  4.5 mg weekly >> generalized pain-  - Lantus  15 units in am - added 07/2021  However, he came off the above medications and currently he is only on: - Lantus  15 units daily in am -started 2 days ago  Pt checks his sugars 1x a day and they are: - am: 129-170 >> 400s - 2h after b'fast: n/c - before lunch: n/c - 2h after lunch: n/c - before dinner: n/c - 2h after dinner: n/c - bedtime: n/c - nighttime: n/c Lowest sugar was 129 >> ?; he has hypoglycemia awareness at 70.  Highest sugar was 180 >> 400.  Glucometer: Accuchek  guide  He still drinks regular sodas and juice.  He saw Leita Constable with nutrition in 09/2021.  - no CKD, last BUN/creatinine:  Lab Results  Component Value Date   BUN 14 09/02/2023   BUN 8 08/08/2023   CREATININE 1.02 09/02/2023   CREATININE 1.02 08/08/2023   Lab Results  Component Value Date   MICRALBCREAT 4 07/01/2021  He is on Cozaar 25 mg daily.  - last set of lipids: Lab Results  Component Value Date   CHOL 168 12/27/2019   HDL 32 (L) 12/27/2019   LDLCALC 99 12/27/2019   TRIG 185 (H) 12/27/2019   CHOLHDL 5.3 12/27/2019  He is on Crestor  10 mg daily.  - last eye exam was 08/11/2023. No DR. + glaucoma and cataracts.  - + numbness and tingling in his feet.  Last foot exam 10/01/2021.  He was taken off Neurontin.  He also has a history of HTN, gout, osteoarthritis, sciatica.  He is disabled.  He requested a service dog due to decreasing eyesight, before losing eyesight completely.  ROS: + see HPI  Past Medical History:  Diagnosis Date   Acute on chronic congestive heart failure (HCC)    Arthritis    Gout    Hay fever    High cholesterol    Hypertension  Mental health problem    Near syncope 12/27/2019   Pre-syncope 12/27/2019   Shortness of breath 02/06/2020   Syncope 12/26/2019   Type 2 diabetes mellitus without complication, without long-term current use of insulin  (HCC)    Unstable angina (HCC) 02/06/2020   Past Surgical History:  Procedure Laterality Date   EYE SURGERY     HEMORROIDECTOMY     RIGHT/LEFT HEART CATH AND CORONARY ANGIOGRAPHY N/A 02/06/2020   Procedure: RIGHT/LEFT HEART CATH AND CORONARY ANGIOGRAPHY;  Surgeon: Anner Alm ORN, MD;  Location: [facility] INVASIVE CV LAB;  Service: Cardiovascular;  Laterality: N/A;   WISDOM TOOTH EXTRACTION     Social History   Socioeconomic History   Marital status: Divorced    Spouse name: Not on file   Number of children: Not on file   Years of education: Not on file   Highest education level: Not on file   Occupational History   Not on file  Tobacco Use   Smoking status: Former    Current packs/day: 0.00    Types: Cigarettes    Quit date: 04/20/2000    Years since quitting: 23.5   Smokeless tobacco: Never  Vaping Use   Vaping status: Never Used  Substance and Sexual Activity   Alcohol use: Never   Drug use: Never   Sexual activity: Not Currently  Other Topics Concern   Not on file  Social History Narrative   Works 3rd shift    Social Drivers of Corporate investment banker Strain: Not on file  Food Insecurity: Not on file  Transportation Needs: Not on file  Physical Activity: Not on file  Stress: Not on file  Social Connections: Not on file  Intimate Partner Violence: Not on file   Current Outpatient Medications on File Prior to Visit  Medication Sig Dispense Refill   empagliflozin  (JARDIANCE ) 25 MG TABS tablet Take 1 tablet (25 mg total) by mouth daily before breakfast. (Patient not taking: Reported on 09/20/2023) 90 tablet 3   furosemide  (LASIX ) 20 MG tablet Take 1 tablet (20 mg total) by mouth daily. (Patient not taking: Reported on 09/20/2023) 30 tablet 0   glipiZIDE  (GLUCOTROL  XL) 10 MG 24 hr tablet Take 10 mg by mouth daily with breakfast. (Patient not taking: Reported on 09/20/2023)     HYDROcodone -acetaminophen  (NORCO/VICODIN) 5-325 MG tablet Take 1 tablet by mouth 2 (two) times daily. (Patient not taking: Reported on 09/20/2023)     insulin  glargine (LANTUS  SOLOSTAR) 100 UNIT/ML Solostar Pen Inject 15 Units into the skin daily. Pt needs to be seen for more refills. 4.5 mL 0   Insulin  Pen Needle (RELION PEN NEEDLES) 32G X 4 MM MISC Inject 1x a day 100 each 2   metFORMIN  (GLUCOPHAGE ) 500 MG tablet Take 1,000 mg by mouth daily. (Patient not taking: Reported on 09/20/2023)     metFORMIN  (GLUCOPHAGE -XR) 500 MG 24 hr tablet Take 2 tablets (1,000 mg total) by mouth daily. (Patient not taking: Reported on 07/21/2022) 180 tablet 3   potassium chloride  SA (KLOR-CON  M) 20 MEQ tablet Take 1  tablet (20 mEq total) by mouth 2 (two) times daily for 4 days. 8 tablet 0   rosuvastatin  (CRESTOR ) 10 MG tablet Take 1 tablet (10 mg total) by mouth daily. (Patient not taking: Reported on 09/20/2023) 90 tablet 1   sertraline (ZOLOFT) 50 MG tablet Take 50 mg by mouth daily as needed (anxiety). (Patient not taking: Reported on 09/20/2023)     sildenafil (REVATIO) 20 MG tablet Take 60-80  mg by mouth daily as needed (erectile dysfunction). (Patient not taking: Reported on 09/20/2023)     triamcinolone  cream (KENALOG ) 0.1 % Apply 1 application topically 2 (two) times daily. (Patient not taking: Reported on 07/21/2022) 80 g 0   TRULICITY  4.5 MG/0.5ML SOPN Inject 4.5 mg into the skin once a week. (Patient not taking: Reported on 09/20/2023) 6 mL 3   No current facility-administered medications on file prior to visit.   Allergies  Allergen Reactions   Lasix  [Furosemide ] Other (See Comments)    High doses cause severe cramping (pt previously on 60mg  QD) OK with current 20 mg QD   Shellfish-Derived Products Hives   Coconut (Cocos Nucifera) Hives and Rash   Family History  Problem Relation Age of Onset   Diabetes Mother    Hypertension Father    Hypertension Brother    Hypertension Maternal Grandfather    Hypertension Paternal Grandmother    PE: BP 120/70   Pulse 89   Ht 5' 9 (1.753 m)   Wt 233 lb 6.4 oz (105.9 kg)   SpO2 96%   BMI 34.47 kg/m  Wt Readings from Last 15 Encounters:  11/04/23 233 lb 6.4 oz (105.9 kg)  09/20/23 240 lb 9.6 oz (109.1 kg)  09/02/23 264 lb 15.9 oz (120.2 kg)  08/08/23 264 lb 15.9 oz (120.2 kg)  07/21/22 265 lb (120.2 kg)  01/01/22 266 lb 12.8 oz (121 kg)  01/01/22 266 lb (120.7 kg)  11/21/21 250 lb (113.4 kg)  11/14/21 252 lb (114.3 kg)  10/13/21 252 lb 8 oz (114.5 kg)  10/02/21 256 lb (116.1 kg)  10/01/21 258 lb 3.2 oz (117.1 kg)  09/10/21 252 lb 6.4 oz (114.5 kg)  08/07/21 253 lb 4.8 oz (114.9 kg)  07/29/21 253 lb 4.8 oz (114.9 kg)   Constitutional:  overweight, in NAD Eyes:  EOMI, no exophthalmos ENT: no neck masses, no cervical lymphadenopathy Cardiovascular: RRR, No MRG, + signif. Pitting B LE Respiratory: CTA B Musculoskeletal: no deformities Skin:no rashes Neurological: no tremor with outstretched hands Diabetic Foot Exam - Simple   Simple Foot Form Diabetic Foot exam was performed with the following findings: Yes 11/04/2023  9:11 AM  Visual Inspection See comments: Yes Sensation Testing Intact to touch and monofilament testing bilaterally: Yes Pulse Check See comments: Yes Comments + significant B pitting edema + pulses not well palpable B    ASSESSMENT: 1. DM2, insulin -dependent, uncontrolled, with complications - CHF - PN  2. HL  PLAN:  1. Patient with longstanding, uncontrolled diabetes, previously seen by Dr. Kassie but with first visit with me almost 2 years ago, now returning for his second visit after long absence.  He was in prison for a year, got out 3 months ago.  He did not receive any of his diabetic medications while there! - At last visit, his sugars were above target in the morning and he was not checking later in the day.  I advised him to try to rotate the blood sugar checks.  He was working with a nutritionist but upon questioning, he was still drinking regular sodas, juice and milk.  We discussed that he needs it to stop these to be able to control his diabetes.  I also advised him to start bromocriptine  but we continued his metformin , sulfonylurea, SGLT2 inhibitor, weekly GLP-1 receptor agonist and daily long-acting insulin .  I advised him to let me know if blood sugars did not improve after these changes.  Of note, HbA1c at that time was  8.8%, above target. -He contacted us  2 days ago with high blood sugars after being off  his diabetic medications.  I advised him to restart insulin  but also scheduled an appointment today.  At today's visit, he appears to have lost a significant amount of weight, ~35  pounds, which could be due to glucotoxicity. -At today's visit, he is checking his blood sugars in the morning and they are in the 400s.  Will go ahead and increase his Lantus  dose and will also need to add mealtime insulin .  Of note, he tells me he would not want to restart metformin  and Trulicity  as he did not like how he felt on these. - I suggested to:  Patient Instructions  Please increase: - Lantus  30 units daily  Please add: - NovoLog  15 min before each meal 8-12 units before meals  Start rotating blood sugars throughout the day  Please return in 1.5 months with your sugar log.   - we checked his HbA1c: >15%  - advised to check sugars at different times of the day - 4x a day, rotating check times - advised for yearly eye exams >> he is UTD - he is due for annual labs -will check a urine protein level today - return to clinic in  1.5 months   2. HL -Latest lipid panel was reviewed from 2021: LDL above our target of less than 55 due to cardiovascular disease, triglycerides elevated, HDL low: Lab Results  Component Value Date   CHOL 168 12/27/2019   HDL 32 (L) 12/27/2019   LDLCALC 99 12/27/2019   TRIG 185 (H) 12/27/2019   CHOLHDL 5.3 12/27/2019  - She continues on Crestor  10 mg daily without side effects - At last visit, I advised him to stop sweet drinks, which should improve his triglyceride level > nondrinking diet drinks> - he is due for a lipid panel -will check this at next visit, when his diabetes is hopefully improved  Lela Fendt, MD PhD [facility]

## 2023-11-04 NOTE — Patient Instructions (Addendum)
 Please increase: - Lantus  30 units daily  Please add: - NovoLog  15 min before each meal 8-12 units before meals  Start rotating blood sugars throughout the day  Please return in 1.5 months with your sugar log.

## 2023-11-04 NOTE — Addendum Note (Signed)
 Addended by: CLEOTILDE ROLIN RAMAN on: 11/04/2023 03:21 PM   Modules accepted: Orders

## 2023-11-05 ENCOUNTER — Ambulatory Visit: Payer: Self-pay | Admitting: Internal Medicine

## 2023-11-05 LAB — MICROALBUMIN / CREATININE URINE RATIO
Creatinine, Urine: 20 mg/dL (ref 20–320)
Microalb, Ur: <0.2 mg/dL

## 2023-11-08 ENCOUNTER — Ambulatory Visit (HOSPITAL_COMMUNITY)
Admission: RE | Admit: 2023-11-08 | Discharge: 2023-11-08 | Disposition: A | Discharge status: Home / Self Care | Source: Ambulatory Visit | Attending: Cardiology

## 2023-11-08 DIAGNOSIS — I5033 Acute on chronic diastolic (congestive) heart failure: Secondary | ICD-10-CM | POA: Diagnosis not present

## 2023-11-08 DIAGNOSIS — I371 Nonrheumatic pulmonary valve insufficiency: Secondary | ICD-10-CM | POA: Insufficient documentation

## 2023-11-08 LAB — ECHOCARDIOGRAM COMPLETE
AR max vel: 2.57 cm2
AV Area VTI: 2.8 cm2
AV Area mean vel: 2.74 cm2
AV Mean grad: 4 mmHg
AV Peak grad: 6.5 mmHg
Ao pk vel: 1.27 m/s
Area-P 1/2: 2.79 cm2
S' Lateral: 3.4 cm

## 2023-11-11 ENCOUNTER — Telehealth: Payer: Self-pay

## 2023-11-11 ENCOUNTER — Ambulatory Visit: Payer: Self-pay

## 2023-11-11 ENCOUNTER — Ambulatory Visit
Admission: RE | Admit: 2023-11-11 | Discharge: 2023-11-11 | Disposition: A | Discharge status: Home / Self Care | Source: Ambulatory Visit

## 2023-11-11 VITALS — BP 100/63 | HR 66 | Temp 97.5°F | Resp 18 | Ht 69.0 in | Wt 233.5 lb

## 2023-11-11 DIAGNOSIS — I872 Venous insufficiency (chronic) (peripheral): Secondary | ICD-10-CM | POA: Diagnosis not present

## 2023-11-11 DIAGNOSIS — F489 Nonpsychotic mental disorder, unspecified: Secondary | ICD-10-CM

## 2023-11-11 DIAGNOSIS — M1990 Unspecified osteoarthritis, unspecified site: Secondary | ICD-10-CM

## 2023-11-11 DIAGNOSIS — I5033 Acute on chronic diastolic (congestive) heart failure: Secondary | ICD-10-CM

## 2023-11-11 DIAGNOSIS — H538 Other visual disturbances: Secondary | ICD-10-CM

## 2023-11-11 DIAGNOSIS — E119 Type 2 diabetes mellitus without complications: Secondary | ICD-10-CM

## 2023-11-11 DIAGNOSIS — E1165 Type 2 diabetes mellitus with hyperglycemia: Secondary | ICD-10-CM | POA: Diagnosis not present

## 2023-11-11 DIAGNOSIS — F419 Anxiety disorder, unspecified: Secondary | ICD-10-CM

## 2023-11-11 HISTORY — DX: Anxiety disorder, unspecified: F41.9

## 2023-11-11 HISTORY — DX: Unspecified cataract: H26.9

## 2023-11-11 HISTORY — DX: Cardiac murmur, unspecified: R01.1

## 2023-11-11 HISTORY — DX: Unspecified glaucoma: H40.9

## 2023-11-11 HISTORY — DX: Reserved for concepts with insufficient information to code with codable children: IMO0002

## 2023-11-11 HISTORY — DX: Anemia, unspecified: D64.9

## 2023-11-11 HISTORY — DX: Allergy, unspecified, initial encounter: T78.40XA

## 2023-11-11 HISTORY — DX: Myoneural disorder, unspecified: G70.9

## 2023-11-11 HISTORY — DX: Depression, unspecified: F32.A

## 2023-11-11 LAB — POCT FASTING CBG KUC MANUAL ENTRY: POCT Glucose (KUC): 126 mg/dL — AB (ref 70–99)

## 2023-11-11 NOTE — Telephone Encounter (Signed)
 Noted. Sending to PCP as FYI.  Scheduled at [facility] at 5.

## 2023-11-11 NOTE — ED Triage Notes (Signed)
 Blurry vision,new insulin , diabetic - Entered by patient  I have DM, diagnosis was some time ago and starting insulin  recently after being off it for a while), as my sugar gets better my vision is getting worse, swelling in my legs is getting worse and tighter, it is difficult to see/read. Last glucose at home: 115/120 around 1pm.   Last Endocrinology appt: 11-04-2023

## 2023-11-11 NOTE — ED Provider Notes (Signed)
 [facility]    CSN: [id number] Arrival date & time: 11/11/23  1703      History   Chief Complaint Chief Complaint  Patient presents with   Eye Problem    HPI Victor [name] is a 53 y.o. male.   Patient presents today for evaluation of blurry vision that started after he resumed insulin  for diabetes.  His last A1c about a week ago was greater than 15.  His provider put him back on insulin  and he has had lower blood sugars since that time.  He also reports some swelling in his legs but this is chronic for him.  He reports that they just do not seem to be improving as much at night as they typically would.  He states he does not wear compression stockings because they are too tight he has trouble getting them on.  He does not report any chest pain or shortness of breath.  He states he has not seen his primary care doctor in about a year.  The history is provided by the patient.  Eye Problem Associated symptoms: no discharge, no nausea, no numbness, no redness and no vomiting     Past Medical History:  Diagnosis Date   Acute on chronic congestive heart failure (HCC)    Allergy 2018-2019   Anemia 2015   Anxiety 2021   Arthritis    Cataract 2021   Depression 2020   Glaucoma 2021   Gout    Hay fever    Heart murmur 2003   High cholesterol    Hypertension    Mental health problem    Near syncope 12/27/2019   Neuromuscular disorder (HCC) 2021   Pre-syncope 12/27/2019   Shortness of breath 02/06/2020   Syncope 12/26/2019   Type 2 diabetes mellitus without complication, without long-term current use of insulin  (HCC)    Ulcer 2003   Unstable angina (HCC) 02/06/2020    Patient Active Problem List   Diagnosis Date Noted   Poorly controlled type 2 diabetes mellitus with circulatory disorder (HCC) 11/04/2023   Bilateral swelling of feet 04/29/2022   Chronic venous insufficiency 10/01/2021   Anxiety 06/05/2021   Bedbug bite 05/13/2021   Localized edema 05/13/2021    PSVT (paroxysmal supraventricular tachycardia) (HCC) 02/19/2020   NSVT (nonsustained ventricular tachycardia) (HCC) 02/19/2020   Obesity (BMI 30-39.9) 02/19/2020   Shortness of breath 02/06/2020   Unstable angina (HCC) 02/06/2020   Mental health problem    Hypertension    High cholesterol    Hay fever    Gout    Arthritis    Type 2 diabetes mellitus without complication, without long-term current use of insulin  (HCC)    Near syncope 12/27/2019   Pre-syncope 12/27/2019   Acute on chronic congestive heart failure (HCC)    Syncope 12/26/2019    Past Surgical History:  Procedure Laterality Date   EYE SURGERY     HEMORROIDECTOMY     RIGHT/LEFT HEART CATH AND CORONARY ANGIOGRAPHY N/A 02/06/2020   Procedure: RIGHT/LEFT HEART CATH AND CORONARY ANGIOGRAPHY;  Surgeon: Anner Alm ORN, MD;  Location: [facility] INVASIVE CV LAB;  Service: Cardiovascular;  Laterality: N/A;   WISDOM TOOTH EXTRACTION         Home Medications    Prior to Admission medications   Medication Sig Start Date End Date Taking? Authorizing Provider  glucose blood (ACCU-CHEK GUIDE TEST) test strip Use to check blood sugar 3-4 times a day.DxCode: E11.65 11/04/23  Yes Trixie File, MD  insulin  aspart (  NOVOLOG  FLEXPEN) 100 UNIT/ML FlexPen Inject 8-12 Units into the skin 3 (three) times daily with meals. Patient taking differently: Inject 9 Units into the skin 3 (three) times daily with meals. 11/04/23  Yes Trixie File, MD  insulin  glargine (LANTUS  SOLOSTAR) 100 UNIT/ML Solostar Pen Inject 15 Units into the skin daily. Pt needs to be seen for more refills. Patient taking differently: Inject 30 Units into the skin daily. Pt needs to be seen for more refills. 11/02/23  Yes Trixie File, MD  Accu-Chek Softclix Lancets lancets Use to check blood sugar 3-4 times a day.DxCode: E11.65 11/04/23   Trixie File, MD  Blood Glucose Monitoring Suppl (ACCU-CHEK GUIDE) w/Device KIT Use to check blood sugar 3-4 times a  day.DxCode: E11.65 11/04/23   Trixie File, MD  Dulaglutide  (TRULICITY ) 4.5 MG/0.5ML SOAJ Inject 4.5 mg into the skin once a week. 11/17/19   [provider]  empagliflozin  (JARDIANCE ) 25 MG TABS tablet Take 1 tablet (25 mg total) by mouth daily before breakfast. Patient not taking: Reported on 09/20/2023 05/16/21   Kassie Mallick, MD  furosemide  (LASIX ) 20 MG tablet Take 1 tablet (20 mg total) by mouth daily. Patient not taking: Reported on 09/20/2023 08/09/23   Logan Ubaldo NOVAK, PA-C  glipiZIDE  (GLUCOTROL  XL) 10 MG 24 hr tablet Take 10 mg by mouth daily with breakfast. Patient not taking: Reported on 09/20/2023    [provider]  glipiZIDE  (GLUCOTROL ) 5 MG tablet Take 5 mg by mouth daily before breakfast. 10/04/19   [provider]  Insulin  Pen Needle (RELION PEN NEEDLES) 32G X 4 MM MISC Inject 1x a day 11/02/23   Trixie File, MD  witch hazel-glycerin (TUCKS) pad Place 1 Application rectally as needed. 03/05/20   [provider]    Family History Family History  Problem Relation Age of Onset   Diabetes Mother    Arthritis Mother    Hypertension Father    Hypertension Brother    Hypertension Maternal Grandfather    Hypertension Paternal Grandmother     Social History Social History   Tobacco Use   Smoking status: Former    Current packs/day: 0.00    Types: Cigarettes    Quit date: 04/20/2000    Years since quitting: 23.5   Smokeless tobacco: Never  Vaping Use   Vaping status: Never Used  Substance Use Topics   Alcohol use: Never   Drug use: Never     Allergies   Lasix  [furosemide ], Shellfish-derived products, and Coconut (cocos nucifera)   Review of Systems Review of Systems  Constitutional:  Negative for chills and fever.  Eyes:  Positive for visual disturbance. Negative for discharge and redness.  Respiratory:  Negative for shortness of breath.   Cardiovascular:  Positive for leg swelling. Negative for chest pain.   Gastrointestinal:  Negative for nausea and vomiting.  Neurological:  Negative for numbness.     Physical Exam Triage Vital Signs ED Triage Vitals  Encounter Vitals Group     BP 11/11/23 1725 100/63     Girls Systolic BP Percentile --      Girls Diastolic BP Percentile --      Boys Systolic BP Percentile --      Boys Diastolic BP Percentile --      Pulse Rate 11/11/23 1725 66     Resp 11/11/23 1725 18     Temp 11/11/23 1725 (!) 97.5 F (36.4 C)     Temp Source 11/11/23 1725 Oral     SpO2 11/11/23 1725  96 %     Weight 11/11/23 1719 233 lb 7.5 oz (105.9 kg)     Height 11/11/23 1719 5' 9 (1.753 m)     Head Circumference --      Peak Flow --      Pain Score 11/11/23 1718 0     Pain Loc --      Pain Education --      Exclude from Growth Chart --    No data found.  Updated Vital Signs BP 100/63 (BP Location: Left Arm)   Pulse 66   Temp (!) 97.5 F (36.4 C) (Oral)   Resp 18   Ht 5' 9 (1.753 m)   Wt 233 lb 7.5 oz (105.9 kg)   SpO2 96%   BMI 34.48 kg/m   Visual Acuity Right Eye Distance: (S) 20/50 (Corrected) Left Eye Distance: (S) 20/50 (Corrected) Bilateral Distance: (S) 20/30 (Corrected)  Right Eye Near:   Left Eye Near:    Bilateral Near:     Physical Exam Vitals and nursing note reviewed.  Constitutional:      General: He is not in acute distress.    Appearance: Normal appearance. He is not ill-appearing.  HENT:     Head: Normocephalic and atraumatic.  Eyes:     Extraocular Movements: Extraocular movements intact.     Conjunctiva/sclera: Conjunctivae normal.     Pupils: Pupils are equal, round, and reactive to light.  Cardiovascular:     Rate and Rhythm: Normal rate and regular rhythm.  Pulmonary:     Effort: Pulmonary effort is normal. No respiratory distress.     Breath sounds: Normal breath sounds. No wheezing, rhonchi or rales.  Musculoskeletal:     Right lower leg: Edema present.     Left lower leg: Edema present.  Neurological:     Mental  Status: He is alert.  Psychiatric:        Mood and Affect: Mood normal.        Behavior: Behavior normal.        Thought Content: Thought content normal.      UC Treatments / Results  Labs (all labs ordered are listed, but only abnormal results are displayed) Labs Reviewed  POCT FASTING CBG [facility] MANUAL ENTRY - Abnormal; Notable for the following components:      Result Value   POCT Glucose ([facility]) 126 (*)    All other components within normal limits    EKG   Radiology No results found.  Procedures Procedures (including critical care time)  Medications Ordered in UC Medications - No data to display  Initial Impression / Assessment and Plan / UC Course  I have reviewed the triage vital signs and the nursing notes.  Pertinent labs & imaging results that were available during my care of the patient were reviewed by me and considered in my medical decision making (see chart for details).    Strongly recommended follow-up with both ophthalmology as well as PCP.  Discussed that symptoms were more from chronic illness and would need treatment by his regular providers.  Strongly encouraged patient to elevate legs and use compression stockings.  No acute symptoms warranting emergent evaluation did encourage evaluation in the emergency room with any worsening symptoms.  Final Clinical Impressions(s) / UC Diagnoses   Final diagnoses:  Blurred vision, bilateral  Chronic venous insufficiency  Poorly controlled type 2 diabetes mellitus with circulatory disorder [facility])     Discharge Instructions       Please follow up  with primary care provider as well as eye doctor tomorrow.   Report to ED with any worsening symptoms.     ED Prescriptions   None    PDMP not reviewed this encounter.   Billy Asberry FALCON, PA-C 11/11/23 1800

## 2023-11-11 NOTE — Discharge Instructions (Signed)
  Please follow up with primary care provider as well as eye doctor tomorrow.   Report to ED with any worsening symptoms.

## 2023-11-11 NOTE — Telephone Encounter (Signed)
 In person referral discussed after SDOH screen completed.   Discussed with provider.

## 2023-11-11 NOTE — Telephone Encounter (Signed)
 FYI Only or Action Required?: FYI only for provider.  Patient was last seen in primary care on 10/01/2021 by Newlin, Enobong, MD.  Called Nurse Triage reporting Blurred Vision.  Symptoms began several days ago.  Interventions attempted: Nothing.  Symptoms are: gradually worsening.  Triage Disposition: Go to ED Now (or PCP Triage)  Patient/caregiver understands and will follow disposition?: Yes   Copied from CRM #[id number]. Topic: Clinical - Red Word Triage >> Nov 11, 2023  3:34 PM Victor [name] wrote: Red Word that prompted transfer to Nurse Triage: eye are blurry , he says from the insulin .  He cannot see.  He says the insulin  working too good Reason for Disposition  [1] Blurred vision or visual changes AND [2] present now AND [3] sudden onset or new (e.g., minutes, hours, days)  (Exception: Seeing floaters / black specks OR previously diagnosed migraine headaches with same symptoms.)  Answer Assessment - Initial Assessment Questions 1. DESCRIPTION: How has your vision changed? (e.g., complete vision loss, blurred vision, double vision, floaters, etc.)     Blurred vision 2. LOCATION: One or both eyes? If one, ask: Which eye?     Both eye 3. SEVERITY: Can you see anything? If Yes, ask: What can you see? (e.g., fine print)     Looks like he is looking through a fish scale 4. ONSET: When did this begin? Did it start suddenly or has this been gradual?     3-4 days 5. PATTERN: Does this come and go, or has it been constant since it started?     constant 6. PAIN: Is there any pain in your eye(s)?  (Scale 1-10; or mild, moderate, severe)     no 7. CONTACTS-GLASSES: Do you wear contacts or glasses?     glass 8. CAUSE: What do you think is causing this visual problem?     Blood sugar was running over 400 now running 99-180 9. OTHER SYMPTOMS: Do you have any other symptoms? (e.g., confusion, headache, arm or leg weakness, speech problems)     Swelling in BLE but has  been ongoing for a while has seen vein specialist and cardiology  Protocols used: Vision Loss or Change-A-AH

## 2023-11-13 ENCOUNTER — Other Ambulatory Visit: Payer: Self-pay | Admitting: Internal Medicine

## 2023-11-15 ENCOUNTER — Other Ambulatory Visit: Payer: Self-pay

## 2023-11-15 ENCOUNTER — Telehealth: Payer: Self-pay | Admitting: *Deleted

## 2023-11-15 DIAGNOSIS — E1165 Type 2 diabetes mellitus with hyperglycemia: Secondary | ICD-10-CM

## 2023-11-15 MED ORDER — LANTUS SOLOSTAR 100 UNIT/ML ~~LOC~~ SOPN
30.0000 [IU] | PEN_INJECTOR | Freq: Every day | SUBCUTANEOUS | 0 refills | Status: DC
Start: 2023-11-15 — End: 2023-11-29

## 2023-11-15 NOTE — Progress Notes (Signed)
 Complex Care Management Note  Care Guide Note 11/15/2023 Name: Victor [name] MRN: [id number] DOB: Feb 19, 1971  Victor [name] is a 53 y.o. year old male who sees Delbert Clam, MD for primary care. I reached out to [employer] by phone today to offer complex care management services.  [name] was given information about Complex Care Management services today including:   The Complex Care Management services include support from the care team which includes your Nurse Care Manager, Clinical Social Worker, or Pharmacist.  The Complex Care Management team is here to help remove barriers to the health concerns and goals most important to you. Complex Care Management services are voluntary, and the patient may decline or stop services at any time by request to their care team member.   Complex Care Management Consent Status: Patient agreed to services and verbal consent obtained.   Follow up plan:  Telephone appointment with complex care management team member scheduled for:  8/4 with BSW 7/29 with RNCM   Encounter Outcome:  Patient Scheduled  Harlene Satterfield  [facility]  [facility] Guide  Direct Dial: [phone]  Fax [phone]

## 2023-11-15 NOTE — Progress Notes (Addendum)
 Care Guide Pharmacy Note  11/15/2023 Name: [name] MRN: [id number] DOB: 09-12-1970  Referred By: Delbert Clam, MD Reason for referral: Complex Care Management (Initial outreach referral to schedule with PharmD, RNCM, and BSW )   Victor [name] is a 53 y.o. year old male who is a primary care patient of Newlin, Clam, MD.  [name] was referred to the pharmacist for assistance related to: DMII  Successful contact was made with the patient to discuss pharmacy services including being ready for the pharmacist to call at least 5 minutes before the scheduled appointment time and to have medication bottles and any blood pressure readings ready for review. The patient agreed to meet with the pharmacist via telephone visit on (date/time). 11/26/23 at 9:00 AM   Harlene [facility]  [facility] Guide  Direct Dial: [phone]  Fax [phone]

## 2023-11-16 ENCOUNTER — Telehealth: Payer: Self-pay | Admitting: *Deleted

## 2023-11-16 ENCOUNTER — Encounter: Payer: Self-pay | Admitting: *Deleted

## 2023-11-16 NOTE — Telephone Encounter (Signed)
 Copied from CRM [id number]. Topic: General - Other >> Nov 16, 2023  2:00 PM Deleta S wrote: Reason for CRM: patient is calling to get in contact with Olam ku regarding appointment on 7/29 please give the patient a call back at [phone]

## 2023-11-16 NOTE — Telephone Encounter (Signed)
 This is not our patient wrong practice.

## 2023-11-22 ENCOUNTER — Other Ambulatory Visit: Payer: Self-pay

## 2023-11-22 NOTE — Patient Instructions (Signed)
 Visit Information  Thank you for taking time to visit with me today. Please don't hesitate to contact me if I can be of assistance to you before our next scheduled appointment.  Our next appointment is by telephone on 11/30/2023 at 9AM Please call the care guide team at [phone] if you need to cancel or reschedule your appointment.   Following is a copy of your care plan:   Goals Addressed             This Visit's Progress    BSW VBCI Social Work Care Plan   On track    Problems:   Corporate treasurer , Geophysicist/field seismologist , Housing , Transportation, and utility assistance  CSW Clinical Goal(s):   Over the next 2 weeks the Patient will work with Child psychotherapist to address concerns related to food insecurity, utility and rental assistance, and transportation..  Interventions:  BSW will provided referrals/resources for identified SDOH needs.   Patient Goals/Self-Care Activities:  Access referrals/resources shared with patient.   Plan:   Telephone follow up appointment with care management team member scheduled for:  11/30/2023 at 9AM         Please call the Suicide and Crisis Lifeline: 988 go to [facility] [address] [place] [phone]) call 911 if you are experiencing a Mental Health or Behavioral Health Crisis or need someone to talk to.  Patient verbalizes understanding of instructions and care plan provided today and agrees to view in MyChart. Active MyChart status and patient understanding of how to access instructions and care plan via MyChart confirmed with patient.     Laymon Doll, BSW [place]/VBCI - [employer] Social Worker [phone]

## 2023-11-22 NOTE — Patient Outreach (Signed)
 Complex Care Management   Visit Note  11/22/2023  Name:  Jawara Latorre MRN: [id number] DOB: 12-21-1970  Situation: Referral received for Complex Care Management related to SDOH Barriers:  Transportation Housing rental assistance Food insecurity Financial Resource Strain Utility assistance I obtained verbal consent from Patient.  Visit completed with patient  on the phone  Background:   Past Medical History:  Diagnosis Date   Acute on chronic congestive heart failure (HCC)    Allergy 2018-2019   Anemia 2015   Anxiety 2021   Arthritis    Cataract 2021   Depression 2020   Glaucoma 2021   Gout    Hay fever    Heart murmur 2003   High cholesterol    Hypertension    Mental health problem    Near syncope 12/27/2019   Neuromuscular disorder (HCC) 2021   Pre-syncope 12/27/2019   Shortness of breath 02/06/2020   Syncope 12/26/2019   Type 2 diabetes mellitus without complication, without long-term current use of insulin  (HCC)    Ulcer 2003   Unstable angina (HCC) 02/06/2020    Assessment: BSW held initial call with patient. Patient was alert and cognitive. SDOH needs were assessed and the following needs were identified: food insecurity, housing (rental & utility assistance), financial strain, and transportation. Patient states he receives FNS benefit but is not enough. Patient agreed to mobile food market distribution schedule and other pantries in [place]. Patient states he recently secured housing after being homeless. However, he states needing assistance with rent and utility. Reports he gets $967 in SSDI and rent is $925. BSW provided patient resources via email for possible assistance through [place]. [place] and [place]. Patient was informed on when to call and what time to attempt to obtain assistance. Patient states he relies on his brother for transportation when he is able to provide transportation. BSW offered SCAT to patient but declined at this time. Patient  states he is aware on how to schedule medicaid transportation for covered appointments and has done so in the past, but not recently. Lastly, BSW completed [facility] online referral for on going care management services. No other resources were provided/requested at this  time.   SDOH Interventions    Flowsheet Row Patient Outreach Telephone from 11/22/2023 in [place] [facility]PART[facility] UC from 11/11/2023 in [facility])  SDOH Interventions    Food Insecurity Interventions Community Resources Provided AMB Referral, Engineer, maintenance, [employer] Resources Provided  Housing Interventions [employer] Provided AMB Referral, Programmer, applications Provided  Transportation Interventions Patient Resources (Friends/Family)  [Brother sometimes provide transportation.] AMB Referral, Programmer, applications Provided  Family [employer] Interventions [employer] Provided AMB Referral, [employer] Provided  Alcohol Usage Interventions -- Intervention Not Indicated (Score <7)  Depression Interventions/Treatment  -- Chemical engineer, Radiation protection practitioner Strain Interventions [employer] Provided [employer] Provided  Physical Activity Interventions -- [employer] Provided  Stress Interventions -- Programmer, applications Provided, [employer] Referral  Social Connections Interventions -- [employer] Provided  Health Literacy Interventions -- AMB Referral, [employer] Provided      Recommendation:   Call resources for financial assistance.   Follow Up Plan:   Telephone follow up appointment date/time:  11/30/2023 at 9AM.   Laymon Doll, BSW [place]/VBCI - [facility] Social Worker [phone]

## 2023-11-25 ENCOUNTER — Other Ambulatory Visit: Payer: Self-pay | Admitting: *Deleted

## 2023-11-25 NOTE — Patient Instructions (Signed)
 Visit Information  Thank you for taking time to visit with me today. Please don't hesitate to contact me if I can be of assistance to you before our next scheduled appointment.  Our next appointment is by telephone on 12/07/2023 at 3:00 pm Please call the care guide team at [phone] if you need to cancel or reschedule your appointment.   Following is a copy of your care plan:   Goals Addressed             This Visit's Progress    VBCI RN Care Plan DM       Problems:  Care Coordination needs related to Financial Strain  and Food Insecurity  Chronic Disease Management support and education needs related to DMII Knowledge Deficits related to DMII  Goal: Over the next 90 days the Patient will continue to work with Medical illustrator and/or Social Worker to address care management and care coordination needs related to DMII as evidenced by adherence to care management team scheduled appointments     demonstrate Improved adherence to prescribed treatment plan for DMII as evidenced by chart review and  patient reporting take all medications exactly as prescribed and will call provider for medication related questions as evidenced by chart review and patient reporting    verbalize understanding of plan for management of DMII as evidenced by chart review and patient reproting work with Child psychotherapist to assist with SDOH as evidenced by review of electronic medical record and patient or care team member report    Interventions:   Diabetes Interventions: Assessed patient's understanding of A1c goal: <8% Provided education to patient about basic DM disease process Reviewed medications with patient and discussed importance of medication adherence Counseled on importance of regular laboratory monitoring as prescribed Discussed plans with patient for ongoing care management follow up and provided patient with direct contact information for care management team Provided patient with written  educational materials related to hypo and hyperglycemia and importance of correct treatment Reviewed scheduled/upcoming provider appointments including: 12/21/2023 @ 8:30 am Review of patient status, including review of consultants reports, relevant laboratory and other test results, and medications completed Screening for signs and symptoms of depression related to chronic disease state  Assessed social determinant of health barriers  Lab Results  Component Value Date   HGBA1C >15.0 (>) 11/04/2023    Patient Self-Care Activities:  Attend all scheduled provider appointments Attend church or other social activities Call pharmacy for medication refills 3-7 days in advance of running out of medications Call provider office for new concerns or questions  Perform all self care activities independently  Perform IADL's (shopping, preparing meals, housekeeping, managing finances) independently Take medications as prescribed   Work with the social worker to address care coordination needs and will continue to work with the clinical team to address health care and disease management related needs Work with the pharmacist to address medication management needs and will continue to work with the clinical team to address health care and disease management related needs keep appointment with eye doctor check blood sugar at prescribed times: before meals and at bedtime and three times daily check feet daily for cuts, sores or redness enter blood sugar readings and medication or insulin  into daily log take the blood sugar log to all doctor visits trim toenails straight across drink 6 to 8 glasses of water each day fill half of plate with vegetables keep a food diary limit fast food meals to no more than 1 per week  manage portion size prepare main meal at home 3 to 5 days each week switch to low-fat or skim milk switch to sugar-free drinks do heel pump exercise 2 to 3 times each day keep feet up  while sitting wash and dry feet carefully every day wear comfortable, cotton socks wear comfortable, well-fitting shoes  Plan:  Telephone follow up appointment with care management team member scheduled for:  12/07/2023 @ 3:00 pm          VBCI RN Care Plan-HF       Problems:  Chronic Disease Management support and education needs related to CHF Knowledge Deficits related to CHF  Goal: Over the next 90 days the Patient will continue to work with Medical illustrator and/or Social Worker to address care management and care coordination needs related to CHF as evidenced by adherence to care management team scheduled appointments     demonstrate understanding of rationale for each prescribed medication as evidenced by chart review and patient report    take all medications exactly as prescribed and will call provider for medication related questions as evidenced by chart review and patient report    verbalize understanding of plan for management of CHF as evidenced by chart review and patient report Strongly encouraged patient to work with Child psychotherapist to address Financial constraints related to utilities, Lacks knowledge of community resource: food, housing due to limitations having access to enough food for the month, and lack of knowledge related to the management of CHF as evidenced by review of electronic medical record and patient reporting   Interventions:   Heart Failure Interventions: Basic overview and discussion of pathophysiology of Heart Failure reviewed Provided education on low sodium diet Reviewed Heart Failure Action Plan in depth and provided written copy Assessed need for readable accurate scales in home Advised patient to weigh each morning after emptying bladder Discussed importance of daily weight related to CHF and advised patient to invest in scales for daily weights and record daily for his providers to view Reviewed role of diuretics in prevention of fluid overload  and management of heart failure; Discussed the importance of keeping all appointments with provider Provided patient with education about the role of exercise in the management of heart failure Screening for signs and symptoms of depression related to chronic disease state  Assessed social determinant of health barriers   Patient Self-Care Activities:  Attend all scheduled provider appointments Attend church or other social activities Call pharmacy for medication refills 3-7 days in advance of running out of medications Call provider office for new concerns or questions  Perform all self care activities independently  Perform IADL's (shopping, preparing meals, housekeeping, managing finances) independently Take medications as prescribed   Work with the social worker to address care coordination needs and will continue to work with the clinical team to address health care and disease management related needs Work with the pharmacist to address medication management needs and will continue to work with the clinical team to address health care and disease management related needs call office if I gain more than 2 pounds in one day or 5 pounds in one week do ankle pumps when sitting keep legs up while sitting track weight in diary use salt in moderation watch for swelling in feet, ankles and legs every day weigh myself daily once patient has obtained a scale for weights. RNCM also provided resources on where to obtain a scale begin a heart failure diary bring diary to all appointments develop  a rescue plan follow rescue plan if symptoms flare-up eat more whole grains, fruits and vegetables, lean meats and healthy fats know when to call the doctor:when in the YELLOW zone as pt was educated and sent educational information as discussed involving an HF action plan on all the HF zones and what to do via his MyChart today track symptoms and what helps feel better or worse dress right for the  weather, hot or cold Limit sodium intake to avoid ongoing swelling to his LE   Plan:  Telephone follow up appointment with care management team member scheduled for:  12/07/2023 @  3:00 pm             Please call the Suicide and Crisis Lifeline: 988 call the [place]  National Suicide Prevention Lifeline: 818-832-6733 or TTY: (867)254-5740 TTY 305 399 8535) to talk to a trained counselor call 1-800-273-TALK (toll free, 24 hour hotline) if you are experiencing a Mental Health or Behavioral Health Crisis or need someone to talk to.  Patient verbalizes understanding of instructions and care plan provided today and agrees to view in MyChart. Active MyChart status and patient understanding of how to access instructions and care plan via MyChart confirmed with patient.     Olam Ku, RN, BSN [place]  [facility], [facility] RN Care Manager Direct Dial: [phone]  Fax: [phone]

## 2023-11-25 NOTE — Patient Outreach (Signed)
 Complex Care Management   Visit Note  11/25/2023  Name:  Victor [name] MRN: [id number] DOB: [birth year]  Situation: Referral received for Complex Care Management related to Heart Failure and Diabetes with Complications I obtained verbal consent from Patient.  Visit completed with patient  on the phone  Background:   Past Medical History:  Diagnosis Date   Acute on chronic congestive heart failure (HCC)    Allergy 2018-2019   Anemia 2015   Anxiety 2021   Arthritis    Cataract 2021   Depression 2020   Glaucoma 2021   Gout    Hay fever    Heart murmur 2003   High cholesterol    Hypertension    Mental health problem    Near syncope 12/27/2019   Neuromuscular disorder (HCC) 2021   Pre-syncope 12/27/2019   Shortness of breath 02/06/2020   Syncope 12/26/2019   Type 2 diabetes mellitus without complication, without long-term current use of insulin  (HCC)    Ulcer 2003   Unstable angina (HCC) 02/06/2020    Assessment: Will research insurance carrier ([employer]) for available resources for services for this patient for his ongoing needs. Note VBCI PharmD and BSW are pending or involved with this pt. RNCM will also send attachments for AD packet and mail forms along with Fall prevention although pt is low risk based upon the one fall reported today. Patient Reported Symptoms:  Cognitive Cognitive Status: Alert and oriented to person, place, and time, Able to follow simple commands, Normal speech and language skills   Health Maintenance Behaviors: Exercise Healing Pattern: Average  Neurological Neurological Review of Symptoms: Other: [place] Neurological Symptoms/Conditions [RPT]: lightheadness Neurological Management Strategies: Adequate rest, Medication therapy Neurological Self-Management Outcome: 4 (good)  HEENT HEENT Symptoms Reported: No symptoms reported HEENT Management Strategies: Adequate rest, Medication therapy    Cardiovascular Cardiovascular Symptoms Reported:  Lightheadness, Swelling in legs or feet (Lightheadness occurs when bending over and with fast movement. Also reports swelling throughout the day to bilateral LE. States this is reduced overnight prior to the next day.) Does patient have uncontrolled Hypertension?: No (recent office visit 100/63 with no reported symptoms) Is patient checking Blood Pressure at home?: No Patient's Recent BP reading at home: Does not monitoring blood pressure (no device) Cardiovascular Management Strategies: Medication therapy, Activity, Diet modification Weight: 233 lb 7.5 oz (105.9 kg)  Respiratory Respiratory Symptoms Reported: No symptoms reported    Endocrine Endocrine Symptoms Reported: No symptoms reported Is patient diabetic?: Yes Is patient checking blood sugars at home?: Yes List most recent blood sugar readings, include date and time of day: Reports 212 at 6:30 am with late night snacking. Reports 110-150 fasting on average. (Patient involved with endocrinologist) Endocrine Self-Management Outcome: 5 (very good)  Gastrointestinal Gastrointestinal Symptoms Reported: No symptoms reported      Genitourinary Genitourinary Symptoms Reported: No symptoms reported    Integumentary Integumentary Symptoms Reported: Skin changes Additional Integumentary Details: Patient reports black spots around bilateral ankles. Patient reports this is being address by the endocrinologist and Vascular/Vein providers. Skin Management Strategies: Routine screening, Coping strategies (Uses medicated vaseline and lubricants to legs and foot powder between toes.) Skin Self-Management Outcome: 4 (good)  Musculoskeletal     Falls in the past year?: Yes Number of falls in past year: 1 or less Was there an injury with Fall?: No Fall Risk Category Calculator: 1 Patient Fall Risk Level: Low Fall Risk Patient at Risk for Falls Due to: Other (Comment) Amon when getting up from lying position) Fall  risk Follow up: Education  provided (Declined Fall prevention to MyChart)  Psychosocial Psychosocial Symptoms Reported: No symptoms reported     Quality of Family Relationships: supportive Do you feel physically threatened by others?: No      11/25/2023   12:27 PM  Depression screen PHQ 2/9  Decreased Interest 0  Down, Depressed, Hopeless 0  PHQ - 2 Score 0    There were no vitals filed for this visit.  Medications Reviewed Today     Reviewed by Alvia Olam BIRCH, RN (Registered Nurse) on 11/25/23 at 1141  Med List Status: <None>   Medication Order Taking? Sig Documenting Provider Last Dose Status Informant  Accu-Chek Softclix Lancets lancets [id number] Yes Use to check blood sugar 3-4 times a day.DxCode: E11.65 Trixie File, MD  Active   Blood Glucose Monitoring Suppl (ACCU-CHEK GUIDE) w/Device KIT [id number] Yes Use to check blood sugar 3-4 times a day.DxCode: E11.65 Trixie File, MD  Active   Dulaglutide  (TRULICITY ) 4.5 MG/0.5ML EMMANUEL [id number]  Inject 4.5 mg into the skin once a week.  Patient not taking: Reported on 11/25/2023   [provider]  Active   empagliflozin  (JARDIANCE ) 25 MG TABS tablet [id number]  Take 1 tablet (25 mg total) by mouth daily before breakfast.  Patient not taking: Reported on 11/25/2023   Kassie Mallick, MD  Active Self, Pharmacy Records  furosemide  (LASIX ) 20 MG tablet [id number]  Take 1 tablet (20 mg total) by mouth daily.  Patient not taking: Reported on 11/25/2023   Logan Ubaldo NOVAK, PA-C  Active   glipiZIDE  (GLUCOTROL  XL) 10 MG 24 hr tablet [id number]  Take 10 mg by mouth daily with breakfast.  Patient not taking: Reported on 11/25/2023   [provider]  Active Self, Pharmacy Records  glipiZIDE  (GLUCOTROL ) 5 MG tablet [id number]  Take 5 mg by mouth daily before breakfast.  Patient not taking: Reported on 11/25/2023   [provider]  Active   glucose blood (ACCU-CHEK GUIDE TEST) test strip [id number] Yes Use to check blood sugar 3-4 times a  day.DxCode: E11.65 Trixie File, MD  Active   insulin  aspart (NOVOLOG  FLEXPEN) 100 UNIT/ML FlexPen [id number] Yes Inject 8-12 Units into the skin 3 (three) times daily with meals.  Patient taking differently: Inject 9 Units into the skin 3 (three) times daily with meals.   Trixie File, MD  Active   insulin  glargine (LANTUS  SOLOSTAR) 100 UNIT/ML Solostar Pen [id number] Yes Inject 30 Units into the skin daily. Trixie File, MD  Active   Insulin  Pen Needle (RELION PEN NEEDLES) 32G X 4 MM MISC [id number] Yes Inject 1x a day Trixie File, MD  Active   witch hazel-glycerin (TUCKS) pad [id number]  Place 1 Application rectally as needed.  Patient not taking: Reported on 11/25/2023   [provider]  Active             Recommendation:   PCP Follow-up Continue Current Plan of Care  Follow Up Plan:   Telephone follow up appointment date/time:  12/07/2023@ 3:00 pm   Olam Alvia, RN, BSN [place]  [facility], [facility] RN Care Manager Direct Dial: [phone]  Fax: [phone]

## 2023-11-26 ENCOUNTER — Other Ambulatory Visit (HOSPITAL_COMMUNITY): Payer: Self-pay

## 2023-11-26 ENCOUNTER — Other Ambulatory Visit: Payer: Self-pay

## 2023-11-26 DIAGNOSIS — E1165 Type 2 diabetes mellitus with hyperglycemia: Secondary | ICD-10-CM

## 2023-11-26 NOTE — Progress Notes (Signed)
 11/26/2023 Name: Victor [name] MRN: [id number] DOB: Aug 07, 1970  Chief Complaint  Patient presents with   Medication Access    Victor [name] is a 53 y.o. year old male who presented for a telephone visit.   They were referred to the pharmacist by Urgent Care PA for assistance in managing medication access. [name] PMH includes HTN, HFpEF, unstable angina, PSVT, chronic venous insufficiency, T2DM, BMI > 30.    Subjective: Patient is attributed to Dr. Newlin at [facility], but has not been seen since June 2023. He connected with the pharmacist at [facility] for a medication review in July 2023. At that time his A1C was 8.8%. Most recently, he has been seen by Dr. Zenaida (Adv HF) clinic on 09/20/23 and Dr. Trixie (endocrinology) on 11/04/23. Will assist patient with immediate needs today, and attempt to connect back with PCP and pharmacist and [facility].  At last visit with Dr. Zenaida, patient reported worsening LEE. He has not been able to tolerate diuretics due to significant cramping, especially at higher doses. Edema did not appear to be consistent with a cardiogenic cause. He was referred to lymphedema clinic. His echo on 11/12/23 was normal, EF 60-6%, no RWMA, normal diastolicfunction.   At last visit with Dr. Vianne, patient reported that he was in prison for 1x year and did not have diabetic medications. He was homeless afterwards, but now has a place to stay. He contacted the endocrinology office prior to this appt and was restarted on insulin  (Lantus  15 units daily). He reported FBG in the 400s. He reported drinking regular soda and juice. A1C was > 15%. He went to the UC on 11/11/23 for blurry vision. He was referred to [facility]I at that time, and encouraged to f/u with his PCP.   Today, patient reports doing well. His main concern is that he is not able to afford his $4 copays for his medications every month, now that he is paying rent. He reports that he is running low on  strips and lancets. He is also running low on insulin  pen needles now that he is injecting insulin  QID. He reports that he sometimes has to discard needles if they are not sharp enough. He asks if he would be eligible  to get a scale through his insurance.    Care Team: Primary Care Provider: Delbert Clam, MD ; Next Scheduled Visit: 12/28/23 Endocrinologist Dr. Vianne ; Next Scheduled Visit: 12/21/23  Medication Access/Adherence  Current Pharmacy:  [id number] - [place], [place] - [address] AT [address] [place] [address] Phone: [phone] Fax: [phone]   Patient reports affordability concerns with their medications: Yes  - since he started paying rent, it has been difficult for him to afford the $4 copays for his medications  Patient reports access/transportation concerns to their pharmacy: No   Patient reports adherence concerns with their medications:  No    - Lantus  filled 11/15/23 for 28ds - Novolog  filled 11/05/23 for 28ds   Diabetes:  Current medications: Lantus  30 units daily, Novolog  8-12 units 15 min before each meal (taking 9 units TID with meals) Medications tried in the past: metformin  ER 1000 mg BID (decreased dose 2/2 upset stomach), Trulicity  4.5 mg (generalized pain), glipizide  XL, bromocriptine , Jardiance   Current glucose readings:  Using Accu Chek Guide meter; testing 3-4 times daily - range 99-212 mg/dL. Reports FBG was 101 mg/dL this morning. Denies BG < 70 mg/dL. Planning to bring glucometer to  upcoming endo appt.  Patient reports hypoglycemic s/sx including occasional lightheadedness- likely relative hypoglycemia. Treats with a spoonful of PB..   Patient denies hyperglycemic symptoms including polyuria (much improved), polydipsia, polyphagia, nocturia, neuropathy. Has stable blurred vision (reports it has gotten slightly better).  Current meal patterns: Three meals a day. Choosing whole wheat carbohydrates. Eating  a lot of salad.  - Drinks: only drinking water throughout the day. Reports that whole milk spiked his BG. Interested in trying almond milk.  Current medication access support: Medicaid (Medicaid ID card scanned into media tab, but no Rx benefits pulling in Epic or WAMB)   Objective:  BP Readings from Last 3 Encounters:  11/11/23 100/63  11/04/23 120/70  09/20/23 118/78    Lab Results  Component Value Date   HGBA1C >15.0 (>) 11/04/2023   HGBA1C 8.8 (A) 01/01/2022   HGBA1C 8.9 (A) 07/23/2021       Latest Ref Rng & Units 09/02/2023   11:28 PM 08/08/2023    6:04 PM 07/21/2022    6:17 PM  BMP  Glucose 70 - 99 mg/dL 570  686  849   BUN 6 - 20 mg/dL 14  8  11    Creatinine 0.61 - 1.24 mg/dL 8.97  8.97  9.05   Sodium 135 - 145 mmol/L 134  133  137   Potassium 3.5 - 5.1 mmol/L 3.8  3.9  3.3   Chloride 98 - 111 mmol/L 102  95  104   CO2 22 - 32 mmol/L 25  26  24    Calcium  8.9 - 10.3 mg/dL 8.8  9.7  9.0     Lab Results  Component Value Date   CHOL 168 12/27/2019   HDL 32 (L) 12/27/2019   LDLCALC 99 12/27/2019   TRIG 185 (H) 12/27/2019   CHOLHDL 5.3 12/27/2019    Medications Reviewed Today     Reviewed by Brinda Lorain SQUIBB, RPH (Pharmacist) on 11/26/23 at [phone]  Med List Status: <None>   Medication Order Taking? Sig Documenting Provider Last Dose Status Informant  Accu-Chek Softclix Lancets lancets [id number]  Use to check blood sugar 3-4 times a day.DxCode: E11.65 Trixie File, MD  Active   Blood Glucose Monitoring Suppl (ACCU-CHEK GUIDE) w/Device KIT [id number]  Use to check blood sugar 3-4 times a day.DxCode: E11.65 Trixie File, MD  Active    Patient not taking:   Discontinued 11/26/23 0925 (Patient has not taken in last 30 days)    Patient not taking:   Discontinued 11/26/23 0926 (Patient has not taken in last 30 days)  Patient not taking:   Discontinued 11/26/23 0926 (Patient has not taken in last 30 days)    Patient not taking:   Discontinued 11/26/23 0926 (Patient has  not taken in last 30 days)  Patient not taking:   Discontinued 11/26/23 0926 (Patient has not taken in last 30 days)   glucose blood (ACCU-CHEK GUIDE TEST) test strip [id number]  Use to check blood sugar 3-4 times a day.DxCode: E11.65 Trixie File, MD  Active   insulin  aspart (NOVOLOG  FLEXPEN) 100 UNIT/ML FlexPen [id number] Yes Inject 8-12 Units into the skin 3 (three) times daily with meals. Trixie File, MD  Active   insulin  glargine (LANTUS  SOLOSTAR) 100 UNIT/ML Solostar Pen [id number] Yes Inject 30 Units into the skin daily. Trixie File, MD  Active   Insulin  Pen Needle (RELION PEN NEEDLES) 32G X 4 MM MISC [id number]  Inject 1x a day Trixie File, MD  Active    Patient not taking:  Discontinued 11/26/23 9072 (Patient has not taken in last 30 days)              Assessment/Plan:   Diabetes: - Currently uncontrolled with most recent A1C of >15.5% above goal <7%. Medication adherence appears appropriate. SMBG suggests that glycemic control is much improved. With day-time pre-prandial glucose still occasionally > 200 mg/dL, he may require an increase in prandial insulin . Will defer to endocrinology for dose adjustment. Congratulated patient on improved control. Will collaborate with PharmD at [facility] to obtain 3 mo supplies of medications and supplies for patient to help with cost. - Last UACR 11/04/23: undetectable - Reviewed long term cardiovascular and renal outcomes of uncontrolled blood sugar - Reviewed goal A1c, goal fasting, and goal 2 hour post prandial glucose - Reviewed hypoglycemia management plan and the rule of 15 - Reviewed dietary modifications including  utilizing the healthy plate method, limiting portion size of carbohydrate foods, increasing intake of protein and non-starchy vegetables. Counseled patient to stay hydrated with water throughout the day. - Reviewed lifestyle modifications including: aiming for 150 minutes of moderate intensity exercise every week.   - Recommend to continue Lantus  30 units daily. Will collaborate with PCP/endo to place orders for 3 mo supply.  - Recommend to continue Novolog  9 units TID with meals. Will collaborate with PCP/endo to place orders for 3 mo supply.  - Educated patient that he cannot be denied medication due to an inability to pay when he has [employer]. Patient aware to request charge account at [facility], if needed. Also educated, that if [facility] does not allow him to do this, we can accommodate him at our [facility] pharmacy in-person or via mail order.  - Recommend to check glucose 4 times daily: before each meal and before bedtime. Counseled patient to bring glucometer or BG log to every appointment. He would likely be a good candidate for CGM monitoring, and this may help reduce costs as he would not have to purchase strips and lancets every month.  - Next A1C due October 2025  - Will collaborate with PCP to request DME order for scale. May need letter of medical necessity for coverage.  Patient verbalized understanding of treatment plan.   Follow Up Plan:  Pharmacist - will forward to Herlene Fleeta Morris, PharmD at [facility] Endocrinology clinic 12/21/23 PCP clinic visit 12/28/23   Lorain Baseman, PharmD [facility] [phone]

## 2023-11-29 ENCOUNTER — Telehealth: Payer: Self-pay

## 2023-11-29 ENCOUNTER — Other Ambulatory Visit: Payer: Self-pay

## 2023-11-29 DIAGNOSIS — E1165 Type 2 diabetes mellitus with hyperglycemia: Secondary | ICD-10-CM

## 2023-11-29 DIAGNOSIS — E1159 Type 2 diabetes mellitus with other circulatory complications: Secondary | ICD-10-CM

## 2023-11-29 DIAGNOSIS — E119 Type 2 diabetes mellitus without complications: Secondary | ICD-10-CM

## 2023-11-29 MED ORDER — RELION PEN NEEDLES 32G X 4 MM MISC
5 refills | Status: DC
  Filled 2023-11-29: qty 200, 67d supply, fill #0

## 2023-11-29 MED ORDER — ACCU-CHEK SOFTCLIX LANCETS MISC
12 refills | Status: AC
  Filled 2023-11-29: qty 400, fill #0

## 2023-11-29 MED ORDER — ACCU-CHEK GUIDE TEST VI STRP
ORAL_STRIP | 12 refills | Status: AC
  Filled 2023-11-29: qty 400, fill #0

## 2023-11-29 MED ORDER — NOVOLOG FLEXPEN 100 UNIT/ML ~~LOC~~ SOPN
8.0000 [IU] | PEN_INJECTOR | Freq: Three times a day (TID) | SUBCUTANEOUS | 3 refills | Status: DC
  Filled 2023-11-29: qty 36, 84d supply, fill #0

## 2023-11-29 MED ORDER — LANTUS SOLOSTAR 100 UNIT/ML ~~LOC~~ SOPN
30.0000 [IU] | PEN_INJECTOR | Freq: Every day | SUBCUTANEOUS | 0 refills | Status: DC
  Filled 2023-11-29: qty 27, 90d supply, fill #0

## 2023-11-29 NOTE — Telephone Encounter (Signed)
-----   Message from Lela Fendt sent at 11/29/2023  9:20 AM EDT ----- J, Please see below -can you please send these? Thank you! CG ----- Message ----- From: Brinda Lorain SQUIBB, [facility] Sent: 11/29/2023   8:14 AM EDT To: Lela Fendt, MD  Hi Dr. Fendt - Mr. Speltz was referred to pharmacy for assistance with medication costs. His BG are much improved after starting basal/bolus insulin . He was educated on [employer] and instructed to set up a charge account with his pharmacy or transfer to [facility] pharmacies for cost assistance if needed. Since it has been difficult for him to afford monthly $4 copays, would you be able to resend his prescriptions for 3 mo supplies: Lantus , Novolog , Accu Chek Guide Strips (checking QID), Softclick Lancets, and insulin  pen needles (injecting QID)? He will be due for a refill before you see him again on 12/21/23. Let me know if you have any questions/concerns. Thank you so much!  Lorain Brinda, PharmD [facility] [phone]

## 2023-11-29 NOTE — Telephone Encounter (Signed)
 Requested Prescriptions   Signed Prescriptions Disp Refills   Accu-Chek Softclix Lancets lancets 400 each 12    Sig: Use to check blood sugar 3-4 times a day.DxCode: E11.65    Authorizing Provider: TRIXIE FILE    Ordering User: Chyna Kneece S   glucose blood (ACCU-CHEK GUIDE TEST) test strip 400 each 12    Sig: Use to check blood sugar 3-4 times a day.DxCode: E11.65    Authorizing Provider: TRIXIE FILE    Ordering User: CLEOTILDE, Jacilyn Sanpedro S   Insulin  Pen Needle (RELION PEN NEEDLES) 32G X 4 MM MISC 300 each 5    Sig: Use to inject insulin  3 x a day    Authorizing Provider: TRIXIE FILE    Ordering User: CLEOTILDE REMAK S   insulin  glargine (LANTUS  SOLOSTAR) 100 UNIT/ML Solostar Pen 30 mL 0    Sig: Inject 30 Units into the skin daily.    Authorizing Provider: TRIXIE FILE    Ordering User: CLEOTILDE REMAK S   insulin  aspart (NOVOLOG  FLEXPEN) 100 UNIT/ML FlexPen 45 mL 3    Sig: Inject 8-12 Units into the skin 3 (three) times daily with meals.    Authorizing Provider: TRIXIE FILE    Ordering User: CLEOTILDE REMAK RAMAN

## 2023-11-29 NOTE — Telephone Encounter (Signed)
 Requested Prescriptions   Signed Prescriptions Disp Refills   Accu-Chek Softclix Lancets lancets 400 each 12    Sig: Use to check blood sugar 3-4 times a day.DxCode: E11.65    Authorizing Provider: TRIXIE FILE    Ordering User: Analeise Mccleery S   glucose blood (ACCU-CHEK GUIDE TEST) test strip 400 each 12    Sig: Use to check blood sugar 3-4 times a day.DxCode: E11.65    Authorizing Provider: TRIXIE FILE    Ordering User: CLEOTILDE, Lyndsee Casa S   Insulin  Pen Needle (RELION PEN NEEDLES) 32G X 4 MM MISC 300 each 5    Sig: Use to inject insulin  3 x a day    Authorizing Provider: TRIXIE FILE    Ordering User: CLEOTILDE, Francys Bolin S   insulin  glargine (LANTUS  SOLOSTAR) 100 UNIT/ML Solostar Pen 30 mL 0    Sig: Inject 30 Units into the skin daily.    Authorizing Provider: TRIXIE FILE    Ordering User: CLEOTILDE REMAK S   insulin  aspart (NOVOLOG  FLEXPEN) 100 UNIT/ML FlexPen 45 mL 3    Sig: Inject 8-12 Units into the skin 3 (three) times daily with meals.    Authorizing Provider: TRIXIE FILE    Ordering User: CLEOTILDE REMAK RAMAN

## 2023-11-30 ENCOUNTER — Other Ambulatory Visit: Payer: Self-pay

## 2023-11-30 NOTE — Patient Instructions (Signed)
 Visit Information  Thank you for taking time to visit with me today. Please don't hesitate to contact me if I can be of assistance to you before our next scheduled appointment.  Your next care management appointment is by telephone on 12/07/2023 at 4pm   Please call the care guide team at [phone] if you need to cancel, schedule, or reschedule an appointment.   Please call the Suicide and Crisis Lifeline: 988 go to [facility] [address] [place] [phone]) call 911 if you are experiencing a Mental Health or Behavioral Health Crisis or need someone to talk to.  Laymon Doll, BSW [place]/VBCI - [employer] Social Worker [phone]

## 2023-11-30 NOTE — Patient Outreach (Signed)
 Complex Care Management   Visit Note  11/30/2023  Name:  Victor [name] MRN: [id number] DOB: 06-08-1970  Situation: Referral received for Complex Care Management related to SDOH Barriers:  Transportation Housing rental/utility assistance Food insecurity Financial Resource Strain I obtained verbal consent from Patient.  Visit completed with patient  on the phone  Background:   Past Medical History:  Diagnosis Date   Acute on chronic congestive heart failure (HCC)    Allergy 2018-2019   Anemia 2015   Anxiety 2021   Arthritis    Cataract 2021   Depression 2020   Glaucoma 2021   Gout    Hay fever    Heart murmur 2003   High cholesterol    Hypertension    Mental health problem    Near syncope 12/27/2019   Neuromuscular disorder (HCC) 2021   Pre-syncope 12/27/2019   Shortness of breath 02/06/2020   Syncope 12/26/2019   Type 2 diabetes mellitus without complication, without long-term current use of insulin  (HCC)    Ulcer 2003   Unstable angina (HCC) 02/06/2020    Assessment: BSW held f/u appt with patient. Patient confirmed he received BSW resources for rent/utility assistance and food via email. Pt reports not being able to secure assistance due to lack of available slots to apply upcoming calling in the morning. Patient was given church office # and encouraged to call to request additional information on how to apply for next application period scheduled for 09/08 at [facility] with [employer].   Patient also made BSW aware of an issue he has with his insulin  medication. Patient is having issues financially in obtaining his medication, but was also told by Pharm that his prescription needs to be updated. BSW reached out to Victor Fendt, MD with [facility] making her aware of this patient situation and request. Patient was also given their office # to call and will be reaching out. No other resources provided/requested at this time.   SDOH Interventions    Flowsheet Row  Patient Outreach Telephone from 11/25/2023 in [place] POPULATION HEALTH DEPARTMENT Patient Outreach Telephone from 11/22/2023 in [place] POPULATION HEALTH DEPARTMENT UC from 11/11/2023 in [facility])  SDOH Interventions     Food Insecurity Interventions Other (Comment)  [Pending appointment with BSW 11/30/2023 @ 9:00 am. Pt states he visits the food pantry twice monthly for food items] [employer] Provided AMB Referral, Engineer, maintenance, [employer] Provided  Housing Interventions Other (Comment)  [Patient states new apartment hard to afford utlizing most of his check.] [employer] Provided AMB Referral, [employer] Provided  Transportation Interventions --  [patient has resources with friends and family] Patient Resources (Friends/Family)  [Brother sometimes provide transportation.] AMB Referral, [employer] Provided  Utilities Interventions Other (Comment)  [Patient is requesting assistance with his utilities will be in the red soon.] [employer] Provided AMB Referral, [employer] Provided  Alcohol Usage Interventions -- -- Intervention Not Indicated (Score <7)  Depression Interventions/Treatment  -- -- Chemical engineer, Radiation protection practitioner Strain Interventions -- Programmer, applications Provided [employer] Provided  Physical Activity Interventions -- -- [employer] Provided  Stress Interventions -- -- Programmer, applications Provided, [employer] Referral  Social Connections Interventions -- -- Programmer, applications Provided  Health Literacy Interventions -- -- AMB Referral, [employer] Provided    Recommendation:   none  Follow Up Plan:   Telephone follow up appointment date/time:  12/07/2023 at [employer]  Victor [name], BSW [place]/VBCI - [employer] Social Worker [phone]

## 2023-11-30 NOTE — Patient Outreach (Signed)
 Complex Care Management   Visit Note  11/30/2023  Name:  Victor [name] MRN: [id number] DOB: 08/30/1970  Situation: Referral received for Complex Care Management related to SDOH Barriers:  Transportation Housing rental/utility assistance Food insecurity Financial Resource Strain I obtained verbal consent from Patient.  Visit completed with patient  on the phone  Background:   Past Medical History:  Diagnosis Date   Acute on chronic congestive heart failure (HCC)    Allergy 2018-2019   Anemia 2015   Anxiety 2021   Arthritis    Cataract 2021   Depression 2020   Glaucoma 2021   Gout    Hay fever    Heart murmur 2003   High cholesterol    Hypertension    Mental health problem    Near syncope 12/27/2019   Neuromuscular disorder (HCC) 2021   Pre-syncope 12/27/2019   Shortness of breath 02/06/2020   Syncope 12/26/2019   Type 2 diabetes mellitus without complication, without long-term current use of insulin  (HCC)    Ulcer 2003   Unstable angina (HCC) 02/06/2020    Assessment: BSW held f/u appt with patient. Patient confirmed he received BSW resources for rent/utility assistance and food via email. Pt reports not being able to secure assistance due to lack of available slots to apply upcoming calling in the morning. Patient was given church office # and encouraged to call to request additional information on how to apply for next application period scheduled for 09/08 at [facility] with [employer].   Patient also made BSW aware of an issue he has with his insulin  medication. Patient is having issues financially in obtaining his medication, but was also told by Pharm that his prescription needs to be updated. BSW reached out to Lela Fendt, MD with [facility] making her aware of this patient situation and request. Patient was also given their office # to call and will be reaching out. No other resources provided/requested at this time.   SDOH Interventions    Flowsheet Row  Patient Outreach Telephone from 11/25/2023 in [place] POPULATION HEALTH DEPARTMENT Patient Outreach Telephone from 11/22/2023 in [place] POPULATION HEALTH DEPARTMENT UC from 11/11/2023 in [facility] Care at [facility])  SDOH Interventions     Food Insecurity Interventions Other (Comment)  [Pending appointment with BSW 11/30/2023 @ 9:00 am. Pt states he visits the food pantry twice monthly for food items] [employer] Provided AMB Referral, Engineer, maintenance, [employer] Provided  Housing Interventions Other (Comment)  [Patient states new apartment hard to afford utlizing most of his check.] [employer] Provided AMB Referral, [facility] Provided  Transportation Interventions --  [patient has resources with friends and family] Patient Resources (Friends/Family)  [Brother sometimes provide transportation.] AMB Referral, [employer] Provided  Utilities Interventions Other (Comment)  [Patient is requesting assistance with his utilities will be in the red soon.] [employer] Provided AMB Referral, [employer] Provided  Alcohol Usage Interventions -- -- Intervention Not Indicated (Score <7)  Depression Interventions/Treatment  -- -- Chemical engineer, Radiation protection practitioner Strain Interventions -- Programmer, applications Provided [employer] Provided  Physical Activity Interventions -- -- [employer] Provided  Stress Interventions -- -- Programmer, applications Provided, [employer] Referral  Social Connections Interventions -- -- Programmer, applications Provided  Health Literacy Interventions -- -- AMB Referral, [employer] Provided    Recommendation:   none  Follow Up Plan:   Telephone follow up appointment date/time:  12/07/2023 at [employer]  Laymon Doll, BSW [place]/VBCI - [employer] Social Worker [phone]

## 2023-12-01 ENCOUNTER — Other Ambulatory Visit: Payer: Self-pay

## 2023-12-01 ENCOUNTER — Other Ambulatory Visit (HOSPITAL_COMMUNITY): Payer: Self-pay

## 2023-12-07 ENCOUNTER — Telehealth: Payer: Self-pay

## 2023-12-07 ENCOUNTER — Encounter: Payer: Self-pay | Admitting: *Deleted

## 2023-12-07 ENCOUNTER — Telehealth: Payer: Self-pay | Admitting: *Deleted

## 2023-12-07 NOTE — Progress Notes (Signed)
 Complex Care Management Care Guide Note  12/07/2023 Name: [name] MRN: [id number] DOB: [birth year]  Victor [name] is a 53 y.o. year old male who is a primary care patient of Newlin, Enobong, MD and is actively engaged with the care management team. I reached out to [name] by phone today to assist with scheduling  with the RN Case Manager BSW.  Follow up plan: Telephone appointment with complex care management team member scheduled for:  9/4  Harlene Satterfield  [facility]  [facility] [place]  [facility] Guide  Direct Dial: [phone]  Fax [phone]

## 2023-12-07 NOTE — Patient Outreach (Addendum)
 Complex Care Management   Visit Note  12/07/2023  Name:  Victor [name] MRN: [id number] DOB: 1970/07/21  Note: RNCM had a scheduled unsuccessful call to the pt today. Patient called back and spoke with  the care-guide. RNCM was on a call with [employer] concerning this patient for a referral or add on of services as pt already involved with behavior health. Care-guide rescheduled and RNCM will alert the involved VBCI social worker of this referral for managed care with [employer].  RNCM contacted pt's insurance under the [employer] with [employer] with the request for managed care due to pt's ongoing diagnosis with Diabetes and HF.  RNCM also included in that referral assistance with financial strain and food insecurity.  The call reference # [id number] with Victor [name]. With [employer] Help Resources for the providers support line [phone]. Successful note from the website on the referral submitted: Thank you for your submission. Your referral/request will be reviewed, and you will receive a response within three business days.  I will follow up with the patient Olam Ku, RN, BSN [place]  [facility], [facility] RN Care Manager Direct Dial: [phone]  Fax: [phone]

## 2023-12-07 NOTE — Patient Instructions (Signed)
 Victor [name] - I am sorry I was unable to reach you today for our scheduled appointment. I work with Newlin, Enobong, MD and am calling to support your healthcare needs. Please contact me at [phone] at your earliest convenience. I look forward to speaking with you soon.   Thank you,   Olam Ku, RN, BSN   [facility], [facility] RN Care Manager Direct Dial: [phone]  Fax: [phone]

## 2023-12-13 ENCOUNTER — Telehealth: Payer: Self-pay | Admitting: Family Medicine

## 2023-12-13 NOTE — Telephone Encounter (Signed)
 Copied from CRM #[id number]. Topic: General - Registration Update >> Dec 13, 2023 10:04 AM Ole G wrote:  Margit reinhold Sero ( care coordinator ) called [phone]  She needs a copy of his CCA - will call back w/fax number  >> Dec 13, 2023  3:45 PM Rosaria BRAVO wrote: Joen called back with fax number  Fax: [phone] Please put Margit Anton' name on attention

## 2023-12-13 NOTE — Telephone Encounter (Signed)
 Victor [name] has been called and informed that patient has not been seen since 2023

## 2023-12-15 NOTE — Patient Outreach (Signed)
 BSW received incoming call from Pt. Pt reports he has an appt with SSA office on 09/15 to apply for Medicare and they are requesting he takes his medical record from the doctors/specialists he has seen since 2003. Pt was encouraged to call PCP to request record or may have to call medical records department with hospital.

## 2023-12-16 ENCOUNTER — Encounter (INDEPENDENT_AMBULATORY_CARE_PROVIDER_SITE_OTHER): Payer: Self-pay

## 2023-12-21 ENCOUNTER — Ambulatory Visit (INDEPENDENT_AMBULATORY_CARE_PROVIDER_SITE_OTHER): Admitting: Internal Medicine

## 2023-12-21 ENCOUNTER — Encounter: Payer: Self-pay | Admitting: Internal Medicine

## 2023-12-21 VITALS — BP 120/76 | HR 76 | Ht 69.0 in | Wt 251.0 lb

## 2023-12-21 DIAGNOSIS — E1165 Type 2 diabetes mellitus with hyperglycemia: Secondary | ICD-10-CM | POA: Diagnosis not present

## 2023-12-21 DIAGNOSIS — E1159 Type 2 diabetes mellitus with other circulatory complications: Secondary | ICD-10-CM | POA: Diagnosis not present

## 2023-12-21 DIAGNOSIS — Z794 Long term (current) use of insulin: Secondary | ICD-10-CM | POA: Diagnosis not present

## 2023-12-21 DIAGNOSIS — E785 Hyperlipidemia, unspecified: Secondary | ICD-10-CM

## 2023-12-21 DIAGNOSIS — E119 Type 2 diabetes mellitus without complications: Secondary | ICD-10-CM

## 2023-12-21 LAB — LIPID PANEL W/REFLEX DIRECT LDL
Cholesterol: 168 mg/dL (ref <200)
HDL: 40 mg/dL (ref >=40)
LDL Cholesterol (Calc): 107 mg/dL — ABNORMAL HIGH
Non-HDL Cholesterol (Calc): 128 mg/dL (ref <130)
Total CHOL/HDL Ratio: 4.2 (calc) (ref <5.0)
Triglycerides: 113 mg/dL (ref <150)

## 2023-12-21 MED ORDER — RELION PEN NEEDLES 32G X 4 MM MISC: 5 refills | Status: AC

## 2023-12-21 MED ORDER — LANTUS SOLOSTAR 100 UNIT/ML ~~LOC~~ SOPN: 30.0000 [IU] | PEN_INJECTOR | Freq: Every day | SUBCUTANEOUS | 3 refills | Status: AC

## 2023-12-21 MED ORDER — NOVOLOG FLEXPEN 100 UNIT/ML ~~LOC~~ SOPN: 8.0000 [IU] | PEN_INJECTOR | Freq: Three times a day (TID) | SUBCUTANEOUS | 3 refills | Status: AC

## 2023-12-21 NOTE — Progress Notes (Signed)
 Patient ID: Victor [name], male   DOB: 1971/01/24, 53 y.o.   MRN: [id number]  HPI: Victor [name] is a 53 y.o.-year-old male, returning for follow-up for DM2, dx initially as prediabetes in 2012, then as DM in 12/2019, insulin -dependent since 09/2021, uncontrolled, with complications (CHF, PN). Pt. previously saw Dr. Kassie, but last visit with me 1.5 months ago.  Interim history: His increased thirst and urination improved.  He has blurry vision. No nausea, chest pain He was in prison for 1 year - no diabetic medications! He had fainting spells 2x after getting out of prison, when he was homeless for 2 mo. before our last visit, he found a place to stay and he started to improve his diet and was also able to restart long-acting insulin . He had back pain and needs surgery  - HbA1c needs to be <8%.  Reviewed HbA1c: Lab Results  Component Value Date   HGBA1C >15.0 (>) 11/04/2023   HGBA1C 8.8 (A) 01/01/2022   HGBA1C 8.9 (A) 07/23/2021   HGBA1C 8.6 (A) 05/16/2021   HGBA1C 8.0 (A) 07/09/2020   HGBA1C 8.9 (A) 04/30/2020   HGBA1C 8.2 (A) 02/29/2020   HGBA1C 10.1 (H) 12/27/2019   He was previously on: - Metformin  ER 1000 >> 500 mg 2x a day, with meals (decreased 2/2 upset stomach) >> would not want start back - Glipizide  XL 5 mg before breakfast - Bromocriptine  2.5 mg daily - Jardiance  25 mg before breakfast - Trulicity  4.5 mg weekly >> generalized pain-  - Lantus  15 units in am - added 07/2021  However, he came off the above medications before last visit, but 2 days prior to the appointment he was able to start: - Lantus  15 units daily in am   I recommended the following regimen: - Lantus  30 units daily in a.m. - NovoLog  15 min before each meal 8-12 units before meals (10 units)  Pt checks his sugars 1-2x a day and they are: - am: 129-170 >> 400s >> 82, 94-125 - 2h after b'fast: n/c - before lunch: n/c - 2h after lunch: n/c >> 92-180, 213 - before dinner: n/c - 2h after dinner: n/c >>  226 - bedtime: n/c - nighttime: n/c >> 115, 153, 225 Lowest sugar was 129 >> 82; he has hypoglycemia awareness at 70.  Highest sugar was 180 >> 400>> 226 (pizza).  Glucometer: Accuchek guide  He saw Leita Constable with nutrition in 09/2021.  - no CKD, last BUN/creatinine:  Lab Results  Component Value Date   BUN 14 09/02/2023   BUN 8 08/08/2023   CREATININE 1.02 09/02/2023   CREATININE 1.02 08/08/2023   Lab Results  Component Value Date   MICRALBCREAT NOTE 11/04/2023   MICRALBCREAT 4 07/01/2021  He is on Cozaar 25 mg daily.  - last set of lipids: Lab Results  Component Value Date   CHOL 168 12/27/2019   HDL 32 (L) 12/27/2019   LDLCALC 99 12/27/2019   TRIG 185 (H) 12/27/2019   CHOLHDL 5.3 12/27/2019  He is on Crestor  10 mg daily.  - last eye exam was 08/11/2023. No DR. + glaucoma and cataracts.  - + numbness and tingling in his feet.  Last foot exam 11/04/2023.  He also has a history of HTN, gout, osteoarthritis, sciatica.  He is disabled.  He requested a service dog due to decreasing eyesight, before losing eyesight completely.  ROS: + see HPI  Past Medical History:  Diagnosis Date   Acute on chronic congestive heart failure (HCC)  Allergy 2018-2019   Anemia 2015   Anxiety 2021   Arthritis    Cataract 2021   Depression 2020   Glaucoma 2021   Gout    Hay fever    Heart murmur 2003   High cholesterol    Hypertension    Mental health problem    Near syncope 12/27/2019   Neuromuscular disorder (HCC) 2021   Pre-syncope 12/27/2019   Shortness of breath 02/06/2020   Syncope 12/26/2019   Type 2 diabetes mellitus without complication, without long-term current use of insulin  (HCC)    Ulcer 2003   Unstable angina (HCC) 02/06/2020   Past Surgical History:  Procedure Laterality Date   EYE SURGERY     HEMORROIDECTOMY     RIGHT/LEFT HEART CATH AND CORONARY ANGIOGRAPHY N/A 02/06/2020   Procedure: RIGHT/LEFT HEART CATH AND CORONARY ANGIOGRAPHY;  Surgeon: Anner Alm ORN, MD;  Location: [facility] INVASIVE CV LAB;  Service: Cardiovascular;  Laterality: N/A;   WISDOM TOOTH EXTRACTION     Social History   Socioeconomic History   Marital status: Divorced    Spouse name: Not on file   Number of children: Not on file   Years of education: Not on file   Highest education level: Not on file  Occupational History   Not on file  Tobacco Use   Smoking status: Former    Current packs/day: 0.00    Types: Cigarettes    Quit date: 04/20/2000    Years since quitting: 23.6   Smokeless tobacco: Never  Vaping Use   Vaping status: Never Used  Substance and Sexual Activity   Alcohol use: Never   Drug use: Never   Sexual activity: Not Currently  Other Topics Concern   Not on file  Social History Narrative   Works 3rd shift    Social Drivers of Health   Financial Resource Strain: High Risk (11/22/2023)   Overall Financial Resource Strain (CARDIA)    Difficulty of Paying Living Expenses: Hard  Food Insecurity: Food Insecurity Present (11/25/2023)   Hunger Vital Sign    Worried About Running Out of Food in the Last Year: Often true    Ran Out of Food in the Last Year: Often true  Transportation Needs: Unmet Transportation Needs (11/25/2023)   PRAPARE - Administrator, Civil Service (Medical): Yes    Lack of Transportation (Non-Medical): Yes  Physical Activity: Insufficiently Active (11/11/2023)   Exercise Vital Sign    Days of Exercise per Week: 2 days    Minutes of Exercise per Session: 60 min  Stress: Stress Concern Present (11/11/2023)   [employer] of Occupational Health - Occupational Stress Questionnaire    Feeling of Stress: Very much  Social Connections: Moderately Isolated (11/11/2023)   Social Connection and Isolation Panel    Frequency of Communication with Friends and Family: More than three times a week    Frequency of Social Gatherings with Friends and Family: More than three times a week    Attends Religious Services: More than 4  times per year    Active Member of [employer] or Organizations: No    Attends Banker Meetings: Never    Marital Status: Divorced  Catering manager Violence: Not At Risk (11/25/2023)   Humiliation, Afraid, Rape, and Kick questionnaire    Fear of Current or Ex-Partner: No    Emotionally Abused: No    Physically Abused: No    Sexually Abused: No   Current Outpatient Medications on File Prior to  Visit  Medication Sig Dispense Refill   Accu-Chek Softclix Lancets lancets Use to check blood sugar 3-4 times a day.DxCode: E11.65 400 each 12   Blood Glucose Monitoring Suppl (ACCU-CHEK GUIDE) w/Device KIT Use to check blood sugar 3-4 times a day.DxCode: E11.65 1 kit 0   glucose blood (ACCU-CHEK GUIDE TEST) test strip Use to check blood sugar 3-4 times a day.DxCode: E11.65 400 each 12   insulin  aspart (NOVOLOG  FLEXPEN) 100 UNIT/ML FlexPen Inject 8-12 Units into the skin 3 (three) times daily with meals. 45 mL 3   insulin  glargine (LANTUS  SOLOSTAR) 100 UNIT/ML Solostar Pen Inject 30 Units into the skin daily. 30 mL 0   Insulin  Pen Needle (RELION PEN NEEDLES) 32G X 4 MM MISC Use to inject insulin  3 x a day 300 each 5   No current facility-administered medications on file prior to visit.   Allergies  Allergen Reactions   Lasix  [Furosemide ] Other (See Comments)    High doses cause severe cramping (pt previously on 60mg  QD) OK with current 20 mg QD   Shellfish-Derived Products Hives   Coconut (Cocos Nucifera) Hives and Rash   Family History  Problem Relation Age of Onset   Diabetes Mother    Arthritis Mother    Hypertension Father    Hypertension Brother    Hypertension Maternal Grandfather    Hypertension Paternal Grandmother    PE: BP 120/76 (BP Location: Left Arm, Patient Position: Sitting, Cuff Size: Normal)   Pulse 76   Ht 5' 9 (1.753 m)   Wt 251 lb (113.9 kg)   SpO2 97%   BMI 37.07 kg/m  Wt Readings from Last 15 Encounters:  12/21/23 251 lb (113.9 kg)  11/25/23 233 lb  7.5 oz (105.9 kg)  11/11/23 233 lb 7.5 oz (105.9 kg)  11/04/23 233 lb 6.4 oz (105.9 kg)  09/20/23 240 lb 9.6 oz (109.1 kg)  09/02/23 264 lb 15.9 oz (120.2 kg)  08/08/23 264 lb 15.9 oz (120.2 kg)  07/21/22 265 lb (120.2 kg)  01/01/22 266 lb 12.8 oz (121 kg)  01/01/22 266 lb (120.7 kg)  11/21/21 250 lb (113.4 kg)  11/14/21 252 lb (114.3 kg)  10/13/21 252 lb 8 oz (114.5 kg)  10/02/21 256 lb (116.1 kg)  10/01/21 258 lb 3.2 oz (117.1 kg)   Constitutional: overweight, in NAD Eyes:  EOMI, no exophthalmos ENT: no neck masses, no cervical lymphadenopathy Cardiovascular: RRR, No MRG, + signif. Pitting B LE Respiratory: CTA B Musculoskeletal: no deformities Skin:no rashes Neurological: no tremor with outstretched hands + callus on the lateral side of left forefoot  ASSESSMENT: 1. DM2, insulin -dependent, uncontrolled, with complications - CHF - PN  2. HL  PLAN:  1. Patient with longstanding, uncontrolled, type 2 diabetes, previously seen by Dr. Kassie but with first visit with me approximately 2 years ago, returning at last visit, 1.5 months ago, after a long absence.  He was not present, and got out 3 months prior to the appointment.  At that time, HbA1c was undetectably high.  Sugars were at target in the morning and he was not checking later in the day.  I advised him to try to rotate the blood sugar checks.  In the past, he was drinking regular sodas, juice, and milk and I strongly advised him to stop all of these.  At last visit, sugars were very high, in the 400s in the morning, and he appeared to be glucotoxicity, having lost approximately 35 pounds.  He was off diabetic medications  before the visit and just restarted on long-acting insulin .  I advised him to increase the dose of Lantus  and we added NovoLog  before each meal.  He mentions that he did not want to restart metformin  and Trulicity  as he was not feeling well on these. - At today's visit, sugars have improved significantly,  being mostly at goal throughout the day with only occasional hyperglycemic spikes into the 200s, whenever having a larger.  Upon questioning, he is bolusing 10 units of NovoLog  before each meal and we discussed about varying the dose based on the size and consistency of his meals. -He has a callus on the lateral side of his left foot.  We discussed about soaking the foot in water for 30 minutes or more, and then gently scraping this off.  We also discussed about possibly seeing podiatry.  I believe that the callus appears due to his significant swelling in bilateral legs.  He has an appointment coming up with PCP. - I suggested to:  Patient Instructions  Please continue: - Lantus  30 units daily - NovoLog  15 min before each meal 8-12 units before meals  Please return in 2 months with your sugar log.   - we we will check another HbA1c at next visit - advised to check sugars at different times of the day - 4x a day, rotating check times - advised for yearly eye exams >> he is UTD - return to clinic in 2 months  2. HL - Latest lipid panel was reviewed from 2021: LDL above our target of less than 55, triglycerides elevated, HDL low: Lab Results  Component Value Date   CHOL 168 12/27/2019   HDL 32 (L) 12/27/2019   LDLCALC 99 12/27/2019   TRIG 185 (H) 12/27/2019   CHOLHDL 5.3 12/27/2019  - He continues on Crestor  10 mg daily without side effects - I previously advised him to stop sweet drinks, which should improve his triglyceride level.  Now drinking diet drinks. - He is due for another lipid panel-will check this today  Lela Fendt, MD PhD [facility]

## 2023-12-21 NOTE — Patient Instructions (Addendum)
 Please continue: - Lantus  30 units daily - NovoLog  15 min before each meal 8-12 units before meals  Please return in 2 months with your sugar log.

## 2023-12-22 ENCOUNTER — Ambulatory Visit: Payer: Self-pay | Admitting: Internal Medicine

## 2023-12-23 ENCOUNTER — Other Ambulatory Visit: Payer: Self-pay | Admitting: *Deleted

## 2023-12-23 ENCOUNTER — Other Ambulatory Visit: Payer: Self-pay

## 2023-12-23 ENCOUNTER — Telehealth: Admitting: *Deleted

## 2023-12-23 MED ORDER — ROSUVASTATIN CALCIUM 10 MG PO TABS: 20.0000 mg | ORAL_TABLET | Freq: Every day | ORAL | 5 refills | Status: AC

## 2023-12-23 NOTE — Patient Outreach (Addendum)
 Complex Care Management   Visit Note  12/23/2023  Name:  Victor [name] MRN: [id number] DOB: March 21, 1971  Situation: Referral received for Complex Care Management related to connect with [facility] care coordinator I obtained verbal consent from Patient.  Visit completed with Patient  on the phone  Background:   Past Medical History:  Diagnosis Date   Acute on chronic congestive heart failure (HCC)    Allergy 2018-2019   Anemia 2015   Anxiety 2021   Arthritis    Cataract 2021   Depression 2020   Glaucoma 2021   Gout    Hay fever    Heart murmur 2003   High cholesterol    Hypertension    Mental health problem    Near syncope 12/27/2019   Neuromuscular disorder (HCC) 2021   Pre-syncope 12/27/2019   Shortness of breath 02/06/2020   Syncope 12/26/2019   Type 2 diabetes mellitus without complication, without long-term current use of insulin  (HCC)    Ulcer 2003   Unstable angina (HCC) 02/06/2020    Assessment: BSW held f/u call with pt. Pt was alert and cognitive. Pt informed BSW his medicare application was denied due to lack of elements qualifying him for medicare, like age and medical conditions. BSW provided pt with [facility] care coordinator Victor [name] [phone]). Pt was encouraged to reach out to her for SDOH needs. Pt understood and agreed to call. BSW  reached out to [place] via [email or web address] informing her of pt outreach. No other resources were provided/requested at this time.   SDOH Interventions    Flowsheet Row Patient Outreach Telephone from 11/25/2023 in [place] POPULATION HEALTH DEPARTMENT Patient Outreach Telephone from 11/22/2023 in [place] POPULATION HEALTH DEPARTMENT UC from 11/11/2023 in [facility] at [facility])  SDOH Interventions     Food Insecurity Interventions Other (Comment)  [Pending appointment with BSW 11/30/2023 @ 9:00 am. Pt states he visits the food pantry twice monthly for food items] [employer] Provided AMB Referral, Engineer, maintenance, [employer] Provided  Housing Interventions Other (Comment)  [Patient states new apartment hard to afford utlizing most of his check.] [employer] Provided AMB Referral, [employer] Provided  Transportation Interventions --  [patient has resources with friends and family] Patient Resources (Friends/Family)  [Brother sometimes provide transportation.] AMB Referral, [employer] Provided  Utilities Interventions Other (Comment)  [Patient is requesting assistance with his utilities will be in the red soon.] [employer] Provided AMB Referral, [employer] Provided  Alcohol Usage Interventions -- -- Intervention Not Indicated (Score <7)  Depression Interventions/Treatment  -- -- Chemical engineer, Radiation protection practitioner Strain Interventions -- Programmer, applications Provided [employer] Provided  Physical Activity Interventions -- -- Programmer, applications Provided  Stress Interventions -- -- Programmer, applications Provided, [employer] Referral  Social Connections Interventions -- -- Programmer, applications Provided  Health Literacy Interventions -- -- AMB Referral, [employer] Provided      Recommendation:   none  Follow Up Plan:   No further f/u required.  Laymon Doll, BSW [place]/VBCI - [employer] Social Worker [phone]

## 2023-12-23 NOTE — Patient Instructions (Signed)
 Visit Information  Thank you for taking time to visit with me today.   Tailored Plan Medicaid On October 19, 2022 some people on [place] Medicaid will move to a new kind of Medicaid health plan called a Tailored Plan. Tailored Plans cover your doctor visits, prescription drugs, and health care services.    If your [place] Medicaid will move to a Tailored Plan, you should have gotten a letter and welcome packet. If you're not sure, call your [place] Medicaid Enrollment Broker at [phone] and ask.  Check out these free materials, in [place] and [place], to learn more about your Tailored Plan: [email or web address]/Tailored-Plans/Toolkit  Tailored Care Management Services  TCM services are available to you now. If you are a Tailored Plan member or will be and want information about Tailored Care Management Services including rides to appointments and community and home services, call the Care Management provider for your county of residence:    [facility] [place], Theodis Fiscal)  Member Services: [phone] Behavioral Health Crisis Line: [phone], [place], [place], [place], [place])  Member Services: [phone] Behavioral Health Crisis Line: 319-176-7716     Please call the Suicide and Crisis Lifeline: 988 go to [facility] [address] [place] [phone]) call 911 if you are experiencing a Mental Health or Behavioral Health Crisis or need someone to talk to.  Patient verbalizes understanding of instructions and care plan provided today and agrees to view in MyChart. Active MyChart status and patient understanding of how to access instructions and care plan via MyChart confirmed with patient.     Burt Casco, BSW [place]/VBCI - [employer] Social Worker [phone]

## 2023-12-23 NOTE — Patient Instructions (Signed)
 Visit Information  Thank you for taking time to visit with me today. Please don't hesitate to contact me if I can be of assistance to you before our next scheduled appointment.  Patient has Education officer, museum and provided resources for his care coordinator at [place]. No further assessment needed on behalf of VBCI.   Please call the care guide team at [phone] if you need to cancel, schedule, or reschedule an appointment.   Please call the Suicide and Crisis Lifeline: 988 call the [place]  National Suicide Prevention Lifeline: (848)101-2923 or TTY: 346-865-2329 TTY 754-855-3440) to talk to a trained counselor call 1-800-273-TALK (toll free, 24 hour hotline) if you are experiencing a Mental Health or Behavioral Health Crisis or need someone to talk to.  Olam Ku, RN, BSN [place]  [facility] Health RN Care Manager Direct Dial: [phone]  Fax: [phone]

## 2023-12-23 NOTE — Patient Outreach (Signed)
 Complex Care Management   Visit Note  12/23/2023  Name:  Victor [name] MRN: [id number] DOB: [birth year]  Situation: Referral received for Complex Care Management related to DM/HTN I obtained verbal consent from Patient.  Visit completed with Patient  on the phone  Background:   Past Medical History:  Diagnosis Date   Acute on chronic congestive heart failure (HCC)    Allergy 2018-2019   Anemia 2015   Anxiety 2021   Arthritis    Cataract 2021   Depression 2020   Glaucoma 2021   Gout    Hay fever    Heart murmur 2003   High cholesterol    Hypertension    Mental health problem    Near syncope 12/27/2019   Neuromuscular disorder (HCC) 2021   Pre-syncope 12/27/2019   Shortness of breath 02/06/2020   Syncope 12/26/2019   Type 2 diabetes mellitus without complication, without long-term current use of insulin  (HCC)    Ulcer 2003   Unstable angina (HCC) 02/06/2020    Assessment:   Pt is currently assigned to Margit Anton, Care Coordinator at [facility].  RNCM will close care management services at this time and provided patient with the contact number to reach his assigned coordinator [phone] member and recipient service line. RNCM also communicated with the involved VBCI social worker concerning case closure due to [facility] members benefits with care management services. Patient Reported Symptoms:  Cognitive Cognitive Status: Alert and oriented to person, place, and time, Able to follow simple commands, Normal speech and language skills   Health Maintenance Behaviors: Exercise Healing Pattern: Average  Neurological Neurological Review of Symptoms: Other: [place] Neurological Symptoms/Conditions [RPT]: lightheadness- endocrinologist suggested pt mention to his PCP with upcoming visit. Neurological Management Strategies: Adequate rest, Medication therapy Neurological Self-Management Outcome: 4 (good)  HEENT HEENT Symptoms Reported: No symptoms reported HEENT  Management Strategies: Adequate rest    Cardiovascular Cardiovascular Symptoms Reported: Lightheadness Does patient have uncontrolled Hypertension?: No Is patient checking Blood Pressure at home?: Yes Patient's Recent BP reading at home: 120/76 on 12/21/2023 Cardiovascular Management Strategies: Coping strategies, Medication therapy, Routine screening Weight: 251 lb (113.9 kg)  Respiratory Respiratory Symptoms Reported: No symptoms reported    Endocrine Endocrine Symptoms Reported: No symptoms reported Is patient diabetic?: Yes Is patient checking blood sugars at home?: Yes List most recent blood sugar readings, include date and time of day: 110 fasting glucose read 12/23/2023 at 6:00 am Endocrine Self-Management Outcome: 5 (very good)  Gastrointestinal Gastrointestinal Symptoms Reported: No symptoms reported      Genitourinary Genitourinary Symptoms Reported: No symptoms reported    Integumentary Integumentary Symptoms Reported:  (Generalized dry skin. Will use OTC lubricant) Skin Management Strategies: Routine screening  Musculoskeletal Musculoskelatal Symptoms Reviewed: No symptoms reported        Psychosocial Psychosocial Symptoms Reported: No symptoms reported          There were no vitals filed for this visit.  Medications Reviewed Today     Reviewed by Alvia Olam BIRCH, RN (Registered Nurse) on 12/23/23 at 1413  Med List Status: <None>   Medication Order Taking? Sig Documenting Provider Last Dose Status Informant  Accu-Chek Softclix Lancets lancets [id number] Yes Use to check blood sugar 3-4 times a day.DxCode: E11.65 Trixie File, MD  Active   Blood Glucose Monitoring Suppl (ACCU-CHEK GUIDE) w/Device KIT [id number] Yes Use to check blood sugar 3-4 times a day.DxCode: E11.65 Trixie File, MD  Active   glucose blood (ACCU-CHEK GUIDE TEST) test strip [id number] Yes Use  to check blood sugar 3-4 times a day.DxCode: E11.65 Trixie File, MD  Active   insulin  aspart  (NOVOLOG  FLEXPEN) 100 UNIT/ML FlexPen [id number] Yes Inject 8-12 Units into the skin 3 (three) times daily with meals. Trixie File, MD  Active   insulin  glargine (LANTUS  SOLOSTAR) 100 UNIT/ML Solostar Pen [id number] Yes Inject 30 Units into the skin daily. Trixie File, MD  Active   Insulin  Pen Needle (RELION PEN NEEDLES) 32G X 4 MM MISC [id number] Yes Use to inject insulin  3 x a day Trixie File, MD  Active             Recommendation:   PCP Follow-up Case will be closed and RNCM provided patient with the available contact to reach [facility] for additional resources.   Olam Ku, RN, BSN [place]  [facility], [facility] Health RN Care Manager Direct Dial: [phone]  Fax: [phone]

## 2023-12-27 ENCOUNTER — Telehealth: Payer: Self-pay | Admitting: Family Medicine

## 2023-12-27 NOTE — Telephone Encounter (Signed)
Pt confirmed appt 9/9

## 2023-12-28 ENCOUNTER — Ambulatory Visit: Attending: Family Medicine | Admitting: Family Medicine

## 2023-12-28 ENCOUNTER — Encounter: Payer: Self-pay | Admitting: Family Medicine

## 2023-12-28 VITALS — BP 112/73 | HR 68 | Temp 97.9°F | Resp 14 | Ht 69.0 in | Wt 255.2 lb

## 2023-12-28 DIAGNOSIS — Z0001 Encounter for general adult medical examination with abnormal findings: Secondary | ICD-10-CM | POA: Diagnosis present

## 2023-12-28 DIAGNOSIS — Z87891 Personal history of nicotine dependence: Secondary | ICD-10-CM | POA: Diagnosis not present

## 2023-12-28 DIAGNOSIS — Z794 Long term (current) use of insulin: Secondary | ICD-10-CM

## 2023-12-28 DIAGNOSIS — I5033 Acute on chronic diastolic (congestive) heart failure: Secondary | ICD-10-CM | POA: Diagnosis not present

## 2023-12-28 DIAGNOSIS — Z1159 Encounter for screening for other viral diseases: Secondary | ICD-10-CM

## 2023-12-28 DIAGNOSIS — E1165 Type 2 diabetes mellitus with hyperglycemia: Secondary | ICD-10-CM | POA: Diagnosis not present

## 2023-12-28 DIAGNOSIS — I509 Heart failure, unspecified: Secondary | ICD-10-CM | POA: Diagnosis not present

## 2023-12-28 DIAGNOSIS — I110 Hypertensive heart disease with heart failure: Secondary | ICD-10-CM | POA: Diagnosis not present

## 2023-12-28 DIAGNOSIS — Z125 Encounter for screening for malignant neoplasm of prostate: Secondary | ICD-10-CM

## 2023-12-28 DIAGNOSIS — M79672 Pain in left foot: Secondary | ICD-10-CM

## 2023-12-28 DIAGNOSIS — M19071 Primary osteoarthritis, right ankle and foot: Secondary | ICD-10-CM | POA: Insufficient documentation

## 2023-12-28 DIAGNOSIS — E1169 Type 2 diabetes mellitus with other specified complication: Secondary | ICD-10-CM | POA: Diagnosis not present

## 2023-12-28 DIAGNOSIS — I872 Venous insufficiency (chronic) (peripheral): Secondary | ICD-10-CM

## 2023-12-28 DIAGNOSIS — M79671 Pain in right foot: Secondary | ICD-10-CM

## 2023-12-28 DIAGNOSIS — Z5986 Financial insecurity: Secondary | ICD-10-CM | POA: Diagnosis not present

## 2023-12-28 DIAGNOSIS — M19072 Primary osteoarthritis, left ankle and foot: Secondary | ICD-10-CM | POA: Diagnosis not present

## 2023-12-28 DIAGNOSIS — E1151 Type 2 diabetes mellitus with diabetic peripheral angiopathy without gangrene: Secondary | ICD-10-CM | POA: Insufficient documentation

## 2023-12-28 DIAGNOSIS — Z6837 Body mass index (BMI) 37.0-37.9, adult: Secondary | ICD-10-CM

## 2023-12-28 MED ORDER — MISC. DEVICES MISC: 0 refills | Status: AC

## 2023-12-28 NOTE — Progress Notes (Signed)
 Subjective:  Patient ID: Victor [name], male    DOB: 1971-01-30  Age: 53 y.o. MRN: [id number]  CC: Annual Exam     Discussed the use of AI scribe software for clinical note transcription with the patient, who gave verbal consent to proceed.  History of Present Illness Victor [name] is a 53 year old male with history of type 2 diabetes mellitus, CHF who presents for an annual physical exam and referrals to specialists.  He seeks referrals to a vascular and vein specialist, a podiatrist, and a weight management clinic.  His diabetes is poorly controlled, with a recent A1c over fifteen. His endocrinologist adjusted his medications last week.  He experiences swelling in his feet and ankles since March, worsening throughout the day and relieved by elevation at night. He wears compression stockings, though they are not optimally tight. He denies shortness of breath. He gained thirty pounds since starting insulin  a month ago per patient but his chart reveals a 15 pound weight gain in the last 5 months.  He has painful nodules on the bottom of both feet, resembling 'half of a hot dog cut in half,' and has not consulted a podiatrist.  He engages in thirty minutes of physical activity daily, mainly walking and cleaning, and is concerned about his weight. He is interested in weight management options. He has high cholesterol with recent medication adjustments. He is hesitant about flu and pneumonia vaccinations due to past experiences.    Past Medical History:  Diagnosis Date   Acute on chronic congestive heart failure (HCC)    Allergy 2018-2019   Anemia 2015   Anxiety 2021   Arthritis    Cataract 2021   Depression 2020   Glaucoma 2021   Gout    Hay fever    Heart murmur 2003   High cholesterol    Hypertension    Mental health problem    Near syncope 12/27/2019   Neuromuscular disorder (HCC) 2021   Pre-syncope 12/27/2019   Shortness of breath 02/06/2020   Syncope 12/26/2019   Type 2  diabetes mellitus without complication, without long-term current use of insulin  (HCC)    Ulcer 2003   Unstable angina (HCC) 02/06/2020    Past Surgical History:  Procedure Laterality Date   EYE SURGERY     HEMORROIDECTOMY     RIGHT/LEFT HEART CATH AND CORONARY ANGIOGRAPHY N/A 02/06/2020   Procedure: RIGHT/LEFT HEART CATH AND CORONARY ANGIOGRAPHY;  Surgeon: Anner Alm ORN, MD;  Location: [facility] INVASIVE CV LAB;  Service: Cardiovascular;  Laterality: N/A;   WISDOM TOOTH EXTRACTION      Family History  Problem Relation Age of Onset   Diabetes Mother    Arthritis Mother    Hypertension Father    Hypertension Brother    Hypertension Maternal Grandfather    Hypertension Paternal Grandmother     Social History   Socioeconomic History   Marital status: Divorced    Spouse name: Not on file   Number of children: Not on file   Years of education: Not on file   Highest education level: Not on file  Occupational History   Not on file  Tobacco Use   Smoking status: Former    Current packs/day: 0.00    Types: Cigarettes    Quit date: 04/20/2000    Years since quitting: 23.7   Smokeless tobacco: Never  Vaping Use   Vaping status: Never Used  Substance and Sexual Activity   Alcohol use: Never   Drug use:  Never   Sexual activity: Not Currently  Other Topics Concern   Not on file  Social History Narrative   Works 3rd shift    Social Drivers of Health   Financial Resource Strain: High Risk (11/22/2023)   Overall Financial Resource Strain (CARDIA)    Difficulty of Paying Living Expenses: Hard  Food Insecurity: Food Insecurity Present (11/25/2023)   Hunger Vital Sign    Worried About Running Out of Food in the Last Year: Often true    Ran Out of Food in the Last Year: Often true  Transportation Needs: Unmet Transportation Needs (11/25/2023)   PRAPARE - Administrator, Civil Service (Medical): Yes    Lack of Transportation (Non-Medical): Yes  Physical Activity:  Insufficiently Active (11/11/2023)   Exercise Vital Sign    Days of Exercise per Week: 2 days    Minutes of Exercise per Session: 60 min  Stress: Stress Concern Present (11/11/2023)   [employer] of Occupational Health - Occupational Stress Questionnaire    Feeling of Stress: Very much  Social Connections: Moderately Isolated (11/11/2023)   Social Connection and Isolation Panel    Frequency of Communication with Friends and Family: More than three times a week    Frequency of Social Gatherings with Friends and Family: More than three times a week    Attends Religious Services: More than 4 times per year    Active Member of [employer] or Organizations: No    Attends Banker Meetings: Never    Marital Status: Divorced    Allergies  Allergen Reactions   Lasix  [Furosemide ] Other (See Comments)    High doses cause severe cramping (pt previously on 60mg  QD) OK with current 20 mg QD   Shellfish-Derived Products Hives   Coconut (Cocos Nucifera) Hives and Rash    Outpatient Medications Prior to Visit  Medication Sig Dispense Refill   Accu-Chek Softclix Lancets lancets Use to check blood sugar 3-4 times a day.DxCode: E11.65 400 each 12   Blood Glucose Monitoring Suppl (ACCU-CHEK GUIDE) w/Device KIT Use to check blood sugar 3-4 times a day.DxCode: E11.65 1 kit 0   glucose blood (ACCU-CHEK GUIDE TEST) test strip Use to check blood sugar 3-4 times a day.DxCode: E11.65 400 each 12   insulin  aspart (NOVOLOG  FLEXPEN) 100 UNIT/ML FlexPen Inject 8-12 Units into the skin 3 (three) times daily with meals. 30 mL 3   insulin  glargine (LANTUS  SOLOSTAR) 100 UNIT/ML Solostar Pen Inject 30 Units into the skin daily. 30 mL 3   Insulin  Pen Needle (RELION PEN NEEDLES) 32G X 4 MM MISC Use to inject insulin  3 x a day 300 each 5   rosuvastatin  (CRESTOR ) 10 MG tablet Take 2 tablets (20 mg total) by mouth daily. 60 tablet 5   No facility-administered medications prior to visit.     ROS Review of  Systems  Constitutional:  Negative for activity change and appetite change.  HENT:  Negative for sinus pressure and sore throat.   Respiratory:  Negative for chest tightness, shortness of breath and wheezing.   Cardiovascular:  Positive for leg swelling. Negative for chest pain and palpitations.  Gastrointestinal:  Negative for abdominal distention, abdominal pain and constipation.  Genitourinary: Negative.   Musculoskeletal:        See HPI  Psychiatric/Behavioral:  Negative for behavioral problems and dysphoric mood.     Objective:  BP 112/73 (BP Location: Right Arm, Patient Position: Sitting, Cuff Size: Normal)   Pulse 68   Temp 97.9  F (36.6 C) (Oral)   Resp 14   Ht 5' 9 (1.753 m)   Wt 255 lb 3.2 oz (115.8 kg)   SpO2 98%   BMI 37.69 kg/m      12/28/2023   10:45 AM 12/23/2023    2:16 PM 12/21/2023    7:50 AM  BP/Weight  Systolic BP 112  879  Diastolic BP 73  76  Wt. (Lbs) 255.2 251 251  BMI 37.69 kg/m2 37.07 kg/m2 37.07 kg/m2    Wt Readings from Last 3 Encounters:  12/28/23 255 lb 3.2 oz (115.8 kg)  12/23/23 251 lb (113.9 kg)  12/21/23 251 lb (113.9 kg)      Physical Exam Constitutional:      Appearance: He is well-developed.  HENT:     Head: Normocephalic and atraumatic.     Right Ear: External ear normal.     Left Ear: External ear normal.  Eyes:     Conjunctiva/sclera: Conjunctivae normal.     Pupils: Pupils are equal, round, and reactive to light.  Neck:     Trachea: No tracheal deviation.  Cardiovascular:     Rate and Rhythm: Normal rate and regular rhythm.     Heart sounds: Normal heart sounds. No murmur heard. Pulmonary:     Effort: Pulmonary effort is normal. No respiratory distress.     Breath sounds: Normal breath sounds. No wheezing.  Chest:     Chest wall: No tenderness.  Abdominal:     General: Bowel sounds are normal.     Palpations: Abdomen is soft. There is no mass.     Tenderness: There is no abdominal tenderness.  Musculoskeletal:         General: No tenderness. Normal range of motion.     Cervical back: Normal range of motion and neck supple.     Right lower leg: Edema (2+) present.     Left lower leg: Edema (2+) present.     Comments: Prominence of metatarsophalangeal joints of fifth toes bilaterally with slight tenderness to palpation  Skin:    General: Skin is warm and dry.  Neurological:     Mental Status: He is alert and oriented to person, place, and time.        Latest Ref Rng & Units 09/02/2023   11:28 PM 08/08/2023    6:04 PM 07/21/2022    6:17 PM  CMP  Glucose 70 - 99 mg/dL 570  686  849   BUN 6 - 20 mg/dL 14  8  11    Creatinine 0.61 - 1.24 mg/dL 8.97  8.97  9.05   Sodium 135 - 145 mmol/L 134  133  137   Potassium 3.5 - 5.1 mmol/L 3.8  3.9  3.3   Chloride 98 - 111 mmol/L 102  95  104   CO2 22 - 32 mmol/L 25  26  24    Calcium  8.9 - 10.3 mg/dL 8.8  9.7  9.0   Total Protein 6.5 - 8.1 g/dL  6.8    Total Bilirubin 0.0 - 1.2 mg/dL  1.0    Alkaline Phos 38 - 126 U/L  70    AST 15 - 41 U/L  22    ALT 0 - 44 U/L  26      Lipid Panel     Component Value Date/Time   CHOL 168 12/21/2023 0827   TRIG 113 12/21/2023 0827   HDL 40 12/21/2023 0827   CHOLHDL 4.2 12/21/2023 0827   VLDL 37 12/27/2019  1228   LDLCALC 107 (H) 12/21/2023 0827    CBC    Component Value Date/Time   WBC 5.0 09/02/2023 2328   RBC 5.13 09/02/2023 2328   HGB 13.7 09/02/2023 2328   HGB 14.6 01/31/2020 1518   HCT 42.5 09/02/2023 2328   HCT 44.4 01/31/2020 1518   PLT 216 09/02/2023 2328   PLT 239 01/31/2020 1518   MCV 82.8 09/02/2023 2328   MCV 84 01/31/2020 1518   MCH 26.7 09/02/2023 2328   MCHC 32.2 09/02/2023 2328   RDW 13.2 09/02/2023 2328   RDW 13.0 01/31/2020 1518   LYMPHSABS 1.4 08/08/2023 1804   MONOABS 0.3 08/08/2023 1804   EOSABS 0.1 08/08/2023 1804   BASOSABS 0.0 08/08/2023 1804    Lab Results  Component Value Date   HGBA1C >15.0 (>) 11/04/2023       Assessment & Plan Annual visit for general adult  medical exam with abnormal finding Annual wellness visit completed. Discussed preventive care, cancer screening, and vaccinations. He declined flu and pneumonia vaccines. - Order PSA test for prostate cancer screening. - Provide referrals to vascular and podiatry specialists.  Venous insufficiency with lower extremity edema Chronic venous insufficiency with worsening bilateral lower extremity edema.  -Will check BNP - Refer to vascular specialist for further evaluation and management. - Advise on compression stocking use and replacement.  Foot pain  Bilateral plantar prominences causing foot pain. - Refer to podiatry for evaluation of stable osteoarthritis of the foot  Congestive heart failure Euvolemic with a EF of 60 to 65% from echo of 10/2023 - Last seen by cardiology in 09/2023 - Provided number to heart care so he can schedule follow-up appointment which needed to be completed in 3 months from his last visit  Type 2 diabetes mellitus with morbid obesity Fluctuating weight. - Of note he gained 15 pounds in the last 5 months but had lost some weight prior to 5 months ago. - He expressed interest in weight management support. - Refer to weight management clinic for evaluation and support.     Healthcare maintenance Screening for prostate cancer-PSA ordered Screening for hepatitis C-labs ordered Up-to-date on colon cancer screening   Meds ordered this encounter  Medications   Misc. Devices MISC    Sig: Scale. Diagnosis - CHF    Dispense:  1 each    Refill:  0    Follow-up: Return in about 6 months (around 06/26/2024).       Corrina Sabin, MD, FAAFP. [facility] [place], [place] [phone]   12/28/2023, 11:21 AM

## 2023-12-30 LAB — PSA, TOTAL AND FREE
PSA, Free Pct: 56 %
PSA, Free: 0.28 ng/mL
Prostate Specific Ag, Serum: 0.5 ng/mL (ref 0.0–4.0)

## 2023-12-30 LAB — HCV AB W REFLEX TO QUANT PCR: HCV Ab: NONREACTIVE

## 2023-12-30 LAB — HCV INTERPRETATION

## 2023-12-30 LAB — BRAIN NATRIURETIC PEPTIDE: BNP: 13 pg/mL (ref 0.0–100.0)

## 2023-12-31 ENCOUNTER — Ambulatory Visit: Payer: Self-pay | Admitting: Family Medicine

## 2024-01-06 ENCOUNTER — Ambulatory Visit (INDEPENDENT_AMBULATORY_CARE_PROVIDER_SITE_OTHER)

## 2024-01-06 ENCOUNTER — Encounter (INDEPENDENT_AMBULATORY_CARE_PROVIDER_SITE_OTHER): Payer: Self-pay

## 2024-01-06 VITALS — Ht 69.0 in | Wt 255.0 lb

## 2024-01-06 DIAGNOSIS — E1159 Type 2 diabetes mellitus with other circulatory complications: Secondary | ICD-10-CM | POA: Diagnosis not present

## 2024-01-06 DIAGNOSIS — E1165 Type 2 diabetes mellitus with hyperglycemia: Secondary | ICD-10-CM | POA: Diagnosis not present

## 2024-01-06 DIAGNOSIS — D492 Neoplasm of unspecified behavior of bone, soft tissue, and skin: Secondary | ICD-10-CM

## 2024-01-06 DIAGNOSIS — M7751 Other enthesopathy of right foot: Secondary | ICD-10-CM | POA: Diagnosis not present

## 2024-01-06 DIAGNOSIS — M778 Other enthesopathies, not elsewhere classified: Secondary | ICD-10-CM

## 2024-01-06 DIAGNOSIS — M7989 Other specified soft tissue disorders: Secondary | ICD-10-CM

## 2024-01-06 DIAGNOSIS — R600 Localized edema: Secondary | ICD-10-CM

## 2024-01-06 DIAGNOSIS — M7752 Other enthesopathy of left foot: Secondary | ICD-10-CM

## 2024-01-06 DIAGNOSIS — E1142 Type 2 diabetes mellitus with diabetic polyneuropathy: Secondary | ICD-10-CM

## 2024-01-06 NOTE — Progress Notes (Signed)
 B/l ace wraps for swelling, b/l 5th MTP IPK's    Subjective:  Patient ID: Victor [name], male    DOB: Oct 23, 1970,  MRN: [id number]  Chief Complaint  Patient presents with   Plantar Fasciitis    Patient is here for chronic bilateral foot pain and swelling with A1C 15+, patient states pain switches back and forth in both feet and has plantar callous bilateral 5th metatarsal    53 y.o. male presents  for diabetic foot care.  He has had some pain and swelling in bilateral extremities.  He does see his primary care provider for heart care.  He admits to some burning, tingling, numbness in bilateral lower extremities.  He relates that he does wear compression socks approximately half the time but finds that they give him athlete's foot between his toes if he wears them every day. Review of Systems: Negative except as noted in the HPI. Denies N/V/F/Ch.  Past Medical History:  Diagnosis Date   Acute on chronic congestive heart failure (HCC)    Allergy 2018-2019   Anemia 2015   Anxiety 2021   Arthritis    Cataract 2021   Depression 2020   Glaucoma 2021   Gout    Hay fever    Heart murmur 2003   High cholesterol    Hypertension    Mental health problem    Near syncope 12/27/2019   Neuromuscular disorder (HCC) 2021   Pre-syncope 12/27/2019   Shortness of breath 02/06/2020   Syncope 12/26/2019   Type 2 diabetes mellitus without complication, without long-term current use of insulin  (HCC)    Ulcer 2003   Unstable angina (HCC) 02/06/2020    Current Outpatient Medications:    Accu-Chek Softclix Lancets lancets, Use to check blood sugar 3-4 times a day.DxCode: E11.65, Disp: 400 each, Rfl: 12   Blood Glucose Monitoring Suppl (ACCU-CHEK GUIDE) w/Device KIT, Use to check blood sugar 3-4 times a day.DxCode: E11.65, Disp: 1 kit, Rfl: 0   glucose blood (ACCU-CHEK GUIDE TEST) test strip, Use to check blood sugar 3-4 times a day.DxCode: E11.65, Disp: 400 each, Rfl: 12   insulin  aspart (NOVOLOG   FLEXPEN) 100 UNIT/ML FlexPen, Inject 8-12 Units into the skin 3 (three) times daily with meals., Disp: 30 mL, Rfl: 3   insulin  glargine (LANTUS  SOLOSTAR) 100 UNIT/ML Solostar Pen, Inject 30 Units into the skin daily., Disp: 30 mL, Rfl: 3   Insulin  Pen Needle (RELION PEN NEEDLES) 32G X 4 MM MISC, Use to inject insulin  3 x a day, Disp: 300 each, Rfl: 5   Misc. Devices MISC, Scale. Diagnosis - CHF, Disp: 1 each, Rfl: 0   rosuvastatin  (CRESTOR ) 10 MG tablet, Take 2 tablets (20 mg total) by mouth daily., Disp: 60 tablet, Rfl: 5  Social History   Tobacco Use  Smoking Status Former   Current packs/day: 0.00   Types: Cigarettes   Quit date: 04/20/2000   Years since quitting: 23.7  Smokeless Tobacco Never    Allergies  Allergen Reactions   Lasix  [Furosemide ] Other (See Comments)    High doses cause severe cramping (pt previously on 60mg  QD) OK with current 20 mg QD   Shellfish-Derived Products Hives   Coconut (Cocos Nucifera) Hives and Rash   Objective:  There were no vitals filed for this visit. Body mass index is 37.66 kg/m. Constitutional Well developed. Well nourished.  Vascular Dorsalis pedis pulses present 1+ bilaterally  Posterior tibial pulses present 1+ bilaterally  Pedal hair growth diminished. Capillary refill normal to all  digits.  No cyanosis or clubbing noted.  Neurologic Normal speech. Oriented to person, place, and time. Epicritic sensation to light touch grossly present bilaterally. Protective sensation with 5.07 monofilament  present bilaterally.  Dermatologic Nails elongated, thickened, dystrophic. No open wounds. Hyperkeratotic lesion with central core bilaterally plantar to 5th MTP.  +2 pitting edema bilaterally extending from foot to calf  Orthopedic: Normal joint ROM without pain or crepitus bilaterally. No visible deformities. No bony tenderness. No prominences.    Radiographs: 3 x-rays of bilateral feet were taken today.  These demonstrate normal foot  shape without significant articular narrowing.  No acute osseous pathology such as fractures are identified.  No Charcot changes.  Assessment:   1. Capsulitis of foot, right   2. Capsulitis of foot   3. Poorly controlled type 2 diabetes mellitus with circulatory disorder (HCC)   4. Localized edema   5. Bilateral swelling of feet   6. Diabetic polyneuropathy associated with type 2 diabetes mellitus (HCC)   7. Skin neoplasm    Plan:  Patient was evaluated and treated and all questions answered.  Diabetes with polyneuropathy and edema, hyperkeratotic lesions -Educated on diabetic footcare. Diabetic risk level high due to uncontrolled diabetes. He is switching medications to try to get better control of his glucose.  - Discussed swelling reduction by using compression socks, when not using compression socks he is to use ACE wraps and elevate. - Patient interested in orthotics to better support foot, he is going to call his insurance about coverage for custom orthotics. I believe custom orthotics are indicated for this patient to help keep his foot in rectus position, reducing pressure on the midfoot and potential for charcot development     Procedure: Paring of Lesion Rationale: painful hyperkeratotic lesion Type of Debridement: manual, sharp debridement. Instrumentation: 312 blade Number of Lesions: 2   Return in about 3 months (around 04/06/2024).  Prentice Ovens, DPM AACFAS Fellowship Trained Podiatric Surgeon [facility]

## 2024-01-18 ENCOUNTER — Telehealth: Payer: Self-pay | Admitting: Family Medicine

## 2024-01-18 DIAGNOSIS — I872 Venous insufficiency (chronic) (peripheral): Secondary | ICD-10-CM

## 2024-01-18 DIAGNOSIS — I5033 Acute on chronic diastolic (congestive) heart failure: Secondary | ICD-10-CM

## 2024-01-18 NOTE — Telephone Encounter (Signed)
 Margit with [facility]) called. Patient requested a referral to home health. Please advise, and give Margit a call back.  Shari Contact: [phone]

## 2024-01-18 NOTE — Telephone Encounter (Signed)
 Routing to PCP for review.

## 2024-01-19 NOTE — Addendum Note (Signed)
 Addended by: Haytham Maher on: 01/19/2024 05:13 PM   Modules accepted: Orders

## 2024-01-20 NOTE — Telephone Encounter (Signed)
 I spoke to the patient and explained that I received a referral for home health nursing and informed him what the nurse does and that there is no guarantee that I will be able to find a home health agency that will accept the referral.  He went on to say that he doesn't need a nurse to provide any assessments/ education, he just wants someone to check on him every day.   I told him that his insurance will not pay for someone to just come to check on him.  He said he has no family here to check on him and he has times when he passes out.  He went on to say that he passes out about twice a month at night when his blood sugar drops.  I asked him what his low blood sugars are when that happens and he said usually 89-90, he tries to keep his blood sugar higher than that.   I told him again that the insurance company will not pay for a nurse to come and check on him. I then asked him about the need for assistance with bathing, dressing, ADLs as he may qualify for PCS and he said he has no problems with those activities and does not need that kind of help.   I told him that I will let Dr Delbert know his thoughts and I will also call his [facility] care manager for more information.   I called Shari/ [facility] to obtain more information as to why she requested home health services and I had to leave a message requesting a call back.

## 2024-01-20 NOTE — Telephone Encounter (Signed)
 Please advise him to notify his Endocrinologist of his blood sugars ASAP as his regimen was recently adjusted. Thanks

## 2024-01-20 NOTE — Telephone Encounter (Signed)
 I called the patient and informed him of Dr Millard response and he said he already called the endocrinologist and has an appointment upcoming.  He thought the appointment was this month and I told him it is not until 02/24/2024 and he said he will call the endocrinology office tomorrow morning.

## 2024-01-24 NOTE — Telephone Encounter (Signed)
 I called Shari/ [facility]  : [phone] to obtain more information as to why she requested home health services and I had to leave a message requesting a call back.

## 2024-01-31 ENCOUNTER — Telehealth: Payer: Self-pay

## 2024-01-31 NOTE — Telephone Encounter (Signed)
 Case manager has been working with patient in regards to home health.      Copied from CRM [id number]. Topic: Referral - Request for Referral >> Jan 31, 2024 10:16 AM Victor [name] wrote: Did the patient discuss referral with their provider in the last year? No (If No - schedule appointment) (If Yes - send message)  Appointment offered? No  Type of order/referral and detailed reason for visit: Home Health referral (pts glucose level went up and he fell and needs someone to come out to check on him and it) per Joen Kobs with [facility] (care coordinator) [phone]  Preference of office, provider, location: to come to his home   If referral order, have you been seen by this specialty before? No (If Yes, this issue or another issue? When? Where?  Can we respond through MyChart? No

## 2024-02-15 ENCOUNTER — Encounter (HOSPITAL_COMMUNITY): Payer: Self-pay | Admitting: Emergency Medicine

## 2024-02-15 ENCOUNTER — Emergency Department (HOSPITAL_COMMUNITY)

## 2024-02-15 ENCOUNTER — Emergency Department (HOSPITAL_COMMUNITY)
Admission: EM | Admit: 2024-02-15 | Discharge: 2024-02-15 | Disposition: A | Discharge status: Home / Self Care | Attending: Emergency Medicine | Admitting: Emergency Medicine

## 2024-02-15 ENCOUNTER — Other Ambulatory Visit: Payer: Self-pay

## 2024-02-15 DIAGNOSIS — M542 Cervicalgia: Secondary | ICD-10-CM | POA: Diagnosis not present

## 2024-02-15 DIAGNOSIS — Y92410 Unspecified street and highway as the place of occurrence of the external cause: Secondary | ICD-10-CM | POA: Insufficient documentation

## 2024-02-15 DIAGNOSIS — M545 Low back pain, unspecified: Secondary | ICD-10-CM | POA: Insufficient documentation

## 2024-02-15 DIAGNOSIS — M791 Myalgia, unspecified site: Secondary | ICD-10-CM

## 2024-02-15 MED ORDER — METHOCARBAMOL 500 MG PO TABS: 500.0000 mg | ORAL_TABLET | Freq: Two times a day (BID) | ORAL | 0 refills | Status: DC

## 2024-02-15 MED ORDER — LIDOCAINE 5 % EX PTCH: 1.0000 | MEDICATED_PATCH | CUTANEOUS | 0 refills | Status: AC

## 2024-02-15 MED ORDER — LIDOCAINE 5 % EX PTCH
2.0000 | MEDICATED_PATCH | CUTANEOUS | Status: DC
  Administered 2024-02-15: 2 via TRANSDERMAL
  Filled 2024-02-15: qty 2

## 2024-02-15 MED ORDER — ACETAMINOPHEN 500 MG PO TABS
1000.0000 mg | ORAL_TABLET | Freq: Once | ORAL | Status: AC
Start: 2024-02-15 — End: 2024-02-15
  Administered 2024-02-15: 1000 mg via ORAL
  Filled 2024-02-15: qty 2
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
## 2024-02-15 NOTE — ED Triage Notes (Signed)
 Pt endorses pain in lower neck and upper back. Describes as a nurse, learning disability.
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
## 2024-02-16 ENCOUNTER — Ambulatory Visit: Payer: Self-pay

## 2024-02-16 NOTE — Telephone Encounter (Signed)
 Appointment scheduled.
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
## 2024-02-17 ENCOUNTER — Ambulatory Visit: Attending: Family Medicine | Admitting: Family Medicine

## 2024-02-17 ENCOUNTER — Encounter: Payer: Self-pay | Admitting: Family Medicine

## 2024-02-17 VITALS — BP 125/79 | HR 82 | Temp 98.1°F | Ht 69.0 in | Wt 273.0 lb

## 2024-02-17 DIAGNOSIS — M62838 Other muscle spasm: Secondary | ICD-10-CM | POA: Diagnosis not present

## 2024-02-17 DIAGNOSIS — Z794 Long term (current) use of insulin: Secondary | ICD-10-CM | POA: Diagnosis not present

## 2024-02-17 DIAGNOSIS — M62830 Muscle spasm of back: Secondary | ICD-10-CM | POA: Diagnosis not present

## 2024-02-17 DIAGNOSIS — M546 Pain in thoracic spine: Secondary | ICD-10-CM | POA: Diagnosis present

## 2024-02-17 DIAGNOSIS — M4722 Other spondylosis with radiculopathy, cervical region: Secondary | ICD-10-CM | POA: Insufficient documentation

## 2024-02-17 DIAGNOSIS — E119 Type 2 diabetes mellitus without complications: Secondary | ICD-10-CM | POA: Diagnosis not present

## 2024-02-17 DIAGNOSIS — M5412 Radiculopathy, cervical region: Secondary | ICD-10-CM | POA: Diagnosis not present

## 2024-02-17 DIAGNOSIS — M47812 Spondylosis without myelopathy or radiculopathy, cervical region: Secondary | ICD-10-CM | POA: Diagnosis present

## 2024-02-17 MED ORDER — DULOXETINE HCL 60 MG PO CPEP: 60.0000 mg | ORAL_CAPSULE | Freq: Every day | ORAL | 3 refills | Status: AC

## 2024-02-17 MED ORDER — METHOCARBAMOL 500 MG PO TABS: 500.0000 mg | ORAL_TABLET | Freq: Two times a day (BID) | ORAL | 1 refills | Status: AC
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
## 2024-02-24 ENCOUNTER — Ambulatory Visit: Admitting: Internal Medicine

## 2024-03-08 ENCOUNTER — Other Ambulatory Visit: Payer: Self-pay | Admitting: Vascular Surgery

## 2024-03-08 DIAGNOSIS — M7989 Other specified soft tissue disorders: Secondary | ICD-10-CM

## 2024-03-10 ENCOUNTER — Emergency Department (HOSPITAL_COMMUNITY)
Admission: EM | Admit: 2024-03-10 | Discharge: 2024-03-11 | Disposition: A | Discharge status: Home / Self Care | Attending: Emergency Medicine | Admitting: Emergency Medicine

## 2024-03-10 ENCOUNTER — Emergency Department (HOSPITAL_COMMUNITY)

## 2024-03-10 ENCOUNTER — Other Ambulatory Visit: Payer: Self-pay

## 2024-03-10 DIAGNOSIS — Z794 Long term (current) use of insulin: Secondary | ICD-10-CM | POA: Insufficient documentation

## 2024-03-10 DIAGNOSIS — I872 Venous insufficiency (chronic) (peripheral): Secondary | ICD-10-CM | POA: Diagnosis not present

## 2024-03-10 DIAGNOSIS — R600 Localized edema: Secondary | ICD-10-CM | POA: Diagnosis not present

## 2024-03-10 DIAGNOSIS — M7989 Other specified soft tissue disorders: Secondary | ICD-10-CM | POA: Diagnosis present

## 2024-03-10 LAB — PRO BRAIN NATRIURETIC PEPTIDE: Pro Brain Natriuretic Peptide: <50 pg/mL (ref <300.0)

## 2024-03-10 LAB — CBC WITH DIFFERENTIAL/PLATELET
Abs Immature Granulocytes: 0.01 K/uL (ref 0.00–0.07)
Basophils Absolute: 0 K/uL (ref 0.0–0.1)
Basophils Relative: 1 %
Eosinophils Absolute: 0.2 K/uL (ref 0.0–0.5)
Eosinophils Relative: 3 %
HCT: 44.7 % (ref 39.0–52.0)
Hemoglobin: 14.7 g/dL (ref 13.0–17.0)
Immature Granulocytes: 0 %
Lymphocytes Relative: 26 %
Lymphs Abs: 1.4 K/uL (ref 0.7–4.0)
MCH: 27.1 pg (ref 26.0–34.0)
MCHC: 32.9 g/dL (ref 30.0–36.0)
MCV: 82.3 fL (ref 80.0–100.0)
Monocytes Absolute: 0.4 K/uL (ref 0.1–1.0)
Monocytes Relative: 7 %
Neutro Abs: 3.5 K/uL (ref 1.7–7.7)
Neutrophils Relative %: 63 %
Platelets: 218 K/uL (ref 150–400)
RBC: 5.43 MIL/uL (ref 4.22–5.81)
RDW: 13 % (ref 11.5–15.5)
WBC: 5.5 K/uL (ref 4.0–10.5)
nRBC: 0 % (ref 0.0–0.2)

## 2024-03-10 LAB — BASIC METABOLIC PANEL WITH GFR
Anion gap: 9 (ref 5–15)
BUN: 19 mg/dL (ref 6–20)
CO2: 26 mmol/L (ref 22–32)
Calcium: 9.7 mg/dL (ref 8.9–10.3)
Chloride: 101 mmol/L (ref 98–111)
Creatinine, Ser: 0.94 mg/dL (ref 0.61–1.24)
GFR, Estimated: >60 mL/min (ref >60)
Glucose, Bld: 314 mg/dL — ABNORMAL HIGH (ref 70–99)
Potassium: 4 mmol/L (ref 3.5–5.1)
Sodium: 136 mmol/L (ref 135–145)

## 2024-03-10 NOTE — ED Triage Notes (Signed)
 Patient c/o bilateral leg pain and swelling  x 2 weeks. Patient report discoloration on both ankle.Patient denies Chest pain and SOB. Patient denies fever.

## 2024-03-10 NOTE — ED Provider Triage Note (Signed)
 Emergency Medicine Provider Triage Evaluation Note  Victor [name] , a 53 y.o. male  was evaluated in triage.  Pt complains of leg swelling. Pt report increase bilateral leg swelling ongoing for more than a week, now having pins and needles sensation and skin appears darker.  No cp or sob.  Does have hx of CHF and have noticed significant weight gain.  Not on diuretics due to drugs allergy.  No claudication  Review of Systems  Positive: As above Negative: As above  Physical Exam  BP (!) 146/89   Pulse 80   Temp 98 F (36.7 C) (Oral)   Resp 17   SpO2 97%  Gen:   Awake, no distress   Resp:  Normal effort  MSK:   Moves extremities without difficulty  Other:  3+ pitting edema to BLE with intact distal pedal pulses  Medical Decision Making  Medically screening exam initiated at 10:01 PM.  Appropriate orders placed.  [name] was informed that the remainder of the evaluation will be completed by another provider, this initial triage assessment does not replace that evaluation, and the importance of remaining in the ED until their evaluation is complete.  Doubt leg ischemia   Nivia Colon, PA-C 03/10/24 2203

## 2024-03-11 ENCOUNTER — Encounter (HOSPITAL_COMMUNITY): Payer: Self-pay | Admitting: Emergency Medicine

## 2024-03-11 NOTE — Discharge Instructions (Signed)
 You were seen in the ER tonight for your leg swelling. There is no sign of heart failure exacerbation today. Please follow up with your vein and vascular specialists; continue your compression stockings. Return to the ER with any new severe symptoms.

## 2024-03-11 NOTE — ED Provider Notes (Signed)
 Victor [name] EMERGENCY DEPARTMENT AT [facility] Provider Note   CSN: [id number] Arrival date & time: 03/10/24  2140     Patient presents with: Leg Swelling   Victor [name] is a 53 y.o. male with known chronic venous insufficiency who is noncompliant with his Lasix  due to stating that it causes him to have severe cramping.  He presents today with persistent bilateral lower extremity swelling with new skin changes to the ankles.  No shortness of breath, chest pain, palpitations, or worsening of swelling.  Has follow-up with vein and vascular in January.   HPI     Prior to Admission medications   Medication Sig Start Date End Date Taking? Authorizing Provider  Accu-Chek Softclix Lancets lancets Use to check blood sugar 3-4 times a day.DxCode: E11.65 11/29/23   Trixie File, MD  Blood Glucose Monitoring Suppl (ACCU-CHEK GUIDE) w/Device KIT Use to check blood sugar 3-4 times a day.DxCode: E11.65 11/04/23   Trixie File, MD  DULoxetine  (CYMBALTA ) 60 MG capsule Take 1 capsule (60 mg total) by mouth daily. 02/17/24   Newlin, Enobong, MD  glucose blood (ACCU-CHEK GUIDE TEST) test strip Use to check blood sugar 3-4 times a day.DxCode: E11.65 11/29/23   Trixie File, MD  insulin  aspart (NOVOLOG  FLEXPEN) 100 UNIT/ML FlexPen Inject 8-12 Units into the skin 3 (three) times daily with meals. 12/21/23   Trixie File, MD  insulin  glargine (LANTUS  SOLOSTAR) 100 UNIT/ML Solostar Pen Inject 30 Units into the skin daily. 12/21/23   Trixie File, MD  Insulin  Pen Needle (RELION PEN NEEDLES) 32G X 4 MM MISC Use to inject insulin  3 x a day 12/21/23   Trixie File, MD  lidocaine  (LIDODERM ) 5 % Place 1 patch onto the skin daily. Remove & Discard patch within 12 hours or as directed by MD 02/15/24   Neldon Hamp RAMAN, PA  methocarbamol  (ROBAXIN ) 500 MG tablet Take 1 tablet (500 mg total) by mouth 2 (two) times daily. 02/17/24   Newlin, Enobong, MD  Misc. Devices MISC Scale. Diagnosis -  CHF 12/28/23   Delbert Clam, MD  rosuvastatin  (CRESTOR ) 10 MG tablet Take 2 tablets (20 mg total) by mouth daily. 12/23/23   Trixie File, MD    Allergies: Lasix  [furosemide ], Shellfish protein-containing drug products, and Coconut (cocos nucifera)    Review of Systems  Cardiovascular:  Positive for leg swelling.    Updated Vital Signs BP (!) 153/95   Pulse 67   Temp 98 F (36.7 C)   Resp 15   SpO2 97%   Physical Exam Vitals and nursing note reviewed.  Constitutional:      Appearance: He is obese. He is not ill-appearing or toxic-appearing.  HENT:     Head: Normocephalic and atraumatic.  Eyes:     General: No scleral icterus.       Right eye: No discharge.        Left eye: No discharge.     Conjunctiva/sclera: Conjunctivae normal.  Pulmonary:     Effort: Pulmonary effort is normal.  Abdominal:     Palpations: Abdomen is soft.  Musculoskeletal:        General: No signs of injury.     Right lower leg: 2+ Edema present.     Left lower leg: 2+ Edema present.       Legs:  Skin:    General: Skin is warm and dry.     Capillary Refill: Capillary refill takes less than 2 seconds.  Neurological:     General: No  focal deficit present.     Mental Status: He is alert.  Psychiatric:        Mood and Affect: Mood normal.     (all labs ordered are listed, but only abnormal results are displayed) Labs Reviewed  BASIC METABOLIC PANEL WITH GFR - Abnormal; Notable for the following components:      Result Value   Glucose, Bld 314 (*)    All other components within normal limits  PRO BRAIN NATRIURETIC PEPTIDE  CBC WITH DIFFERENTIAL/PLATELET    EKG: None  Radiology: DG Chest 2 View Result Date: 03/10/2024 EXAM: 2 VIEW(S) XRAY OF THE CHEST 03/10/2024 11:38:00 PM COMPARISON: 09/02/2023 CLINICAL HISTORY: fluid retention FINDINGS: LUNGS AND PLEURA: Shallow inspiration. Lungs are clear. No pleural effusion. No pneumothorax. HEART AND MEDIASTINUM: Heart size and pulmonary  vascularity are normal. Mediastinal contours appear intact. BONES AND SOFT TISSUES: Degenerative changes in the spine and shoulders. No acute osseous abnormality. IMPRESSION: 1. No acute cardiopulmonary process. Electronically signed by: Elsie Gravely MD 03/10/2024 11:40 PM EST RP Workstation: [id number]     Procedures   Medications Ordered in the ED - No data to display                                  Medical Decision Making 53 year old male who presents with bilateral extremity swelling.  Hypertensive on intake, vital signs otherwise normal.  Cardiopulmonary exam unremarkable, abdominal exam is benign.  Bilateral lower extremities with swelling without pitting, chronic venous stasis changes to the skin.  DDx includes but limited to DVT, CHF, chronic venous stasis.  Amount and/or Complexity of Data Reviewed Labs:     Details: CBC, BMP, BNP unremarkable. Radiology:     Details:  chest x-ray is negative. ECG/medicine tests:     Details: EKG with sinus rhythm    The patient was consistent with patient's known chronic venous insufficiency in context of poor compliance with diuretic.  Recommend resumption of the diuretic, however patient unwilling to do so.  Encourage patient to continue using compression stockings, elevating his legs when not ambulatory and following up closely with vein and vascular in the outpatient setting.  Clinical concern for emergent underlying condition that would warrant further ED workup and patient management is exceedingly low.  Park voiced understanding of his medical evaluation and treatment plan. Each of their questions answered to their expressed satisfaction.  Return precautions were given.  Patient is well-appearing, stable, and was discharged in good condition.  This chart was dictated using voice recognition software, Dragon. Despite the best efforts of this provider to proofread and correct errors, errors may still occur which can change  documentation meaning.      Final diagnoses:  Bilateral lower extremity edema    ED Discharge Orders     None          Bobette Pleasant JONELLE DEVONNA 03/11/24 0719    Griselda Norris, MD 03/11/24 2333

## 2024-04-06 ENCOUNTER — Ambulatory Visit

## 2024-04-07 ENCOUNTER — Ambulatory Visit

## 2024-04-07 DIAGNOSIS — D492 Neoplasm of unspecified behavior of bone, soft tissue, and skin: Secondary | ICD-10-CM

## 2024-04-07 DIAGNOSIS — E1142 Type 2 diabetes mellitus with diabetic polyneuropathy: Secondary | ICD-10-CM

## 2024-04-07 DIAGNOSIS — E1159 Type 2 diabetes mellitus with other circulatory complications: Secondary | ICD-10-CM

## 2024-04-07 DIAGNOSIS — E1165 Type 2 diabetes mellitus with hyperglycemia: Secondary | ICD-10-CM

## 2024-04-07 DIAGNOSIS — R600 Localized edema: Secondary | ICD-10-CM | POA: Diagnosis not present

## 2024-04-07 DIAGNOSIS — M7989 Other specified soft tissue disorders: Secondary | ICD-10-CM | POA: Diagnosis not present

## 2024-04-07 NOTE — Progress Notes (Unsigned)
 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

## 2024-04-25 ENCOUNTER — Encounter: Payer: Self-pay | Admitting: Physician Assistant

## 2024-04-25 ENCOUNTER — Ambulatory Visit: Admitting: Physician Assistant

## 2024-04-25 ENCOUNTER — Ambulatory Visit (HOSPITAL_COMMUNITY)
Admission: RE | Admit: 2024-04-25 | Discharge: 2024-04-25 | Disposition: A | Discharge status: Home / Self Care | Source: Ambulatory Visit | Attending: Vascular Surgery | Admitting: Vascular Surgery

## 2024-04-25 VITALS — BP 143/87 | HR 89 | Temp 98.0°F

## 2024-04-25 DIAGNOSIS — M7989 Other specified soft tissue disorders: Secondary | ICD-10-CM | POA: Insufficient documentation

## 2024-04-25 DIAGNOSIS — I890 Lymphedema, not elsewhere classified: Secondary | ICD-10-CM | POA: Diagnosis not present

## 2024-04-25 NOTE — Progress Notes (Signed)
 " Office Note     CC:  follow up Requesting Provider:  Delbert Clam, MD  HPI: Victor [name] is a 54 y.o. (1971/04/11) male who presents for evaluation of bilateral lower extremity edema.  He was seen in the clinic for the same complaint in 2021.  He reports worsening edema and darkening skin in the area of the medial lower legs bilaterally.  He denies any diagnosis of DVT or venous ulceration since that time.  He also has not had any significant trauma or any vascular surgery since that time.  He reportedly wears compression and elevates his legs.  He has been prescribed diuretics however he reports to be allergic to these medications.  He denies tobacco use.   Past Medical History:  Diagnosis Date   Acute on chronic congestive heart failure (HCC)    Allergy 2018-2019   Anemia 2015   Anxiety 2021   Arthritis    Cataract 2021   Depression 2020   Glaucoma 2021   Gout    Hay fever    Heart murmur 2003   High cholesterol    Hypertension    Mental health problem    Near syncope 12/27/2019   Neuromuscular disorder (HCC) 2021   Pre-syncope 12/27/2019   Shortness of breath 02/06/2020   Syncope 12/26/2019   Type 2 diabetes mellitus without complication, without long-term current use of insulin  (HCC)    Ulcer 2003   Unstable angina (HCC) 02/06/2020    Past Surgical History:  Procedure Laterality Date   EYE SURGERY     HEMORROIDECTOMY     RIGHT/LEFT HEART CATH AND CORONARY ANGIOGRAPHY N/A 02/06/2020   Procedure: RIGHT/LEFT HEART CATH AND CORONARY ANGIOGRAPHY;  Surgeon: Anner Alm ORN, MD;  Location: [facility] INVASIVE CV LAB;  Service: Cardiovascular;  Laterality: N/A;   WISDOM TOOTH EXTRACTION      Social History   Socioeconomic History   Marital status: Divorced    Spouse name: Not on file   Number of children: Not on file   Years of education: Not on file   Highest education level: Not on file  Occupational History   Not on file  Tobacco Use   Smoking status: Former     Current packs/day: 0.00    Types: Cigarettes    Quit date: 04/20/2000    Years since quitting: 24.0   Smokeless tobacco: Never  Vaping Use   Vaping status: Never Used  Substance and Sexual Activity   Alcohol use: Never   Drug use: Never   Sexual activity: Not Currently  Other Topics Concern   Not on file  Social History Narrative   Works 3rd shift    Social Drivers of Health   Tobacco Use: Medium Risk (04/25/2024)   Patient History    Smoking Tobacco Use: Former    Smokeless Tobacco Use: Never    Passive Exposure: Not on Actuary Strain: High Risk (11/22/2023)   Overall Financial Resource Strain (CARDIA)    Difficulty of Paying Living Expenses: Hard  Food Insecurity: Food Insecurity Present (11/25/2023)   Epic    Worried About Programme Researcher, Broadcasting/film/video in the Last Year: Often true    Ran Out of Food in the Last Year: Often true  Transportation Needs: Unmet Transportation Needs (11/25/2023)   Epic    Lack of Transportation (Medical): Yes    Lack of Transportation (Non-Medical): Yes  Physical Activity: Insufficiently Active (11/11/2023)   Exercise Vital Sign    Days of Exercise per  Week: 2 days    Minutes of Exercise per Session: 60 min  Stress: Stress Concern Present (11/11/2023)   [employer] of Occupational Health - Occupational Stress Questionnaire    Feeling of Stress: Very much  Social Connections: Moderately Isolated (11/11/2023)   Social Connection and Isolation Panel    Frequency of Communication with Friends and Family: More than three times a week    Frequency of Social Gatherings with Friends and Family: More than three times a week    Attends Religious Services: More than 4 times per year    Active Member of [employer] or Organizations: No    Attends Banker Meetings: Never    Marital Status: Divorced  Catering Manager Violence: Not At Risk (11/25/2023)   Epic    Fear of Current or Ex-Partner: No    Emotionally Abused: No    Physically Abused:  No    Sexually Abused: No  Depression (PHQ2-9): Medium Risk (02/17/2024)   Depression (PHQ2-9)    PHQ-2 Score: 6  Alcohol Screen: Not on file  Housing: High Risk (11/25/2023)   Epic    Unable to Pay for Housing in the Last Year: No    Number of Times Moved in the Last Year: 0    Homeless in the Last Year: Yes  Utilities: Not At Risk (11/25/2023)   Epic    Threatened with loss of utilities: No  Recent Concern: Utilities - At Risk (11/22/2023)   Epic    Threatened with loss of utilities: Yes  Health Literacy: Inadequate Health Literacy (11/11/2023)   B1300 Health Literacy    Frequency of need for help with medical instructions: Always    Family History  Problem Relation Age of Onset   Diabetes Mother    Arthritis Mother    Hypertension Father    Hypertension Brother    Hypertension Maternal Grandfather    Hypertension Paternal Grandmother     Current Outpatient Medications  Medication Sig Dispense Refill   Accu-Chek Softclix Lancets lancets Use to check blood sugar 3-4 times a day.DxCode: E11.65 400 each 12   Blood Glucose Monitoring Suppl (ACCU-CHEK GUIDE) w/Device KIT Use to check blood sugar 3-4 times a day.DxCode: E11.65 1 kit 0   DULoxetine  (CYMBALTA ) 60 MG capsule Take 1 capsule (60 mg total) by mouth daily. 30 capsule 3   glucose blood (ACCU-CHEK GUIDE TEST) test strip Use to check blood sugar 3-4 times a day.DxCode: E11.65 400 each 12   insulin  aspart (NOVOLOG  FLEXPEN) 100 UNIT/ML FlexPen Inject 8-12 Units into the skin 3 (three) times daily with meals. 30 mL 3   insulin  glargine (LANTUS  SOLOSTAR) 100 UNIT/ML Solostar Pen Inject 30 Units into the skin daily. 30 mL 3   Insulin  Pen Needle (RELION PEN NEEDLES) 32G X 4 MM MISC Use to inject insulin  3 x a day 300 each 5   lidocaine  (LIDODERM ) 5 % Place 1 patch onto the skin daily. Remove & Discard patch within 12 hours or as directed by MD 30 patch 0   methocarbamol  (ROBAXIN ) 500 MG tablet Take 1 tablet (500 mg total) by mouth 2  (two) times daily. 60 tablet 1   Misc. Devices MISC Scale. Diagnosis - CHF 1 each 0   rosuvastatin  (CRESTOR ) 10 MG tablet Take 2 tablets (20 mg total) by mouth daily. 60 tablet 5   No current facility-administered medications for this visit.    Allergies[1]   REVIEW OF SYSTEMS:  Negative unless noted in HPI [X]  denotes positive  finding, [ ]  denotes negative finding Cardiac  Comments:  Chest pain or chest pressure:    Shortness of breath upon exertion:    Short of breath when lying flat:    Irregular heart rhythm:        Vascular    Pain in calf, thigh, or hip brought on by ambulation:    Pain in feet at night that wakes you up from your sleep:     Blood clot in your veins:    Leg swelling:         Pulmonary    Oxygen at home:    Productive cough:     Wheezing:         Neurologic    Sudden weakness in arms or legs:     Sudden numbness in arms or legs:     Sudden onset of difficulty speaking or slurred speech:    Temporary loss of vision in one eye:     Problems with dizziness:         Gastrointestinal    Blood in stool:     Vomited blood:         Genitourinary    Burning when urinating:     Blood in urine:        Psychiatric    Major depression:         Hematologic    Bleeding problems:    Problems with blood clotting too easily:        Skin    Rashes or ulcers:        Constitutional    Fever or chills:      PHYSICAL EXAMINATION:  Vitals:   04/25/24 1126  BP: (!) 143/87  Pulse: 89  Temp: 98 F (36.7 C)    General:  WDWN in NAD; vital signs documented above Gait: Not observed HENT: WNL, normocephalic Pulmonary: normal non-labored breathing Cardiac: regular HR Abdomen: soft, NT, no masses Skin no rashes Vascular Exam/Pulses: palpable DP pulses BLE Extremities: Brawny induration and hyperpigmentation medial lower legs bilateral early with edema to the level of the mid to proximal shin Musculoskeletal: no muscle wasting or atrophy  Neurologic:  A&O X 3 Psychiatric:  The pt has Normal affect.   Non-Invasive Vascular Imaging:   Right lower extremity venous reflux study negative for DVT, deep reflux, and superficial venous reflux    ASSESSMENT/PLAN:: 54 y.o. male here for evaluation of bilateral lower extremity edema with pigmentation changes  Mr. Downum is a 54 year old male who was seen in our office in 2021 for evaluation of bilateral lower extremity edema.  He returns due to worsening edema and tissue changes of bilateral lower extremities.  He reports darkening and indurated skin on his medial lower legs.  He denies any DVT or venous ulcerations since last office visit.  Right lower extremity is most symptomatic.  Reflux study however was negative for DVT, deep venous reflux, and superficial venous reflux.  On exam he has no evidence of arterial insufficiency with palpable DP pulses.  Based on his swelling pattern which involves his feet and his toes he appears to have lymphedema.  Recommendations include regular use of 15 to 20 mmHg knee-high compression socks which he was measured for and prescribed today.  We also discussed proper leg elevation which should be performed periodically throughout the day.  We also discussed continuing exercise as well as weight loss.  He will be referred back to his PCP with recommendations for referral to a lymphedema clinic  and information will be provided.  Nothing further to offer from a vascular surgery standpoint.  He will follow-up on an as-needed basis.   Donnice Sender, PA-C [facility] [phone]  Clinic MD:   [place]     [1]  Allergies Allergen Reactions   Lasix  [Furosemide ] Other (See Comments)    High doses cause severe cramping (pt previously on 60mg  QD) OK with current 20 mg QD   Shellfish Protein-Containing Drug Products Hives   Coconut (Cocos Nucifera) Hives and Rash   "

## 2024-04-25 NOTE — Patient Instructions (Signed)

## 2024-05-16 ENCOUNTER — Ambulatory Visit: Admitting: Internal Medicine

## 2024-05-29 ENCOUNTER — Ambulatory Visit: Admitting: Internal Medicine

## 2024-06-05 ENCOUNTER — Institutional Professional Consult (permissible substitution) (INDEPENDENT_AMBULATORY_CARE_PROVIDER_SITE_OTHER): Payer: Self-pay | Admitting: Physician Assistant

## 2024-06-26 ENCOUNTER — Ambulatory Visit: Admitting: Family Medicine
